# Patient Record
Sex: Male | Born: 1955 | Race: White | Hispanic: No | Marital: Married | Smoking: Never smoker
Health system: Southern US, Community
[De-identification: ages and names within clinical notes are randomized; demographics above are authoritative.]

## PROBLEM LIST (undated history)

## (undated) DIAGNOSIS — E291 Testicular hypofunction: Secondary | ICD-10-CM

## (undated) DIAGNOSIS — E785 Hyperlipidemia, unspecified: Secondary | ICD-10-CM

## (undated) DIAGNOSIS — I471 Supraventricular tachycardia, unspecified: Secondary | ICD-10-CM

## (undated) DIAGNOSIS — M67979 Unspecified disorder of synovium and tendon, unspecified ankle and foot: Secondary | ICD-10-CM

## (undated) DIAGNOSIS — I10 Essential (primary) hypertension: Secondary | ICD-10-CM

## (undated) DIAGNOSIS — K219 Gastro-esophageal reflux disease without esophagitis: Secondary | ICD-10-CM

## (undated) DIAGNOSIS — C801 Malignant (primary) neoplasm, unspecified: Secondary | ICD-10-CM

## (undated) DIAGNOSIS — R7303 Prediabetes: Secondary | ICD-10-CM

## (undated) DIAGNOSIS — I499 Cardiac arrhythmia, unspecified: Secondary | ICD-10-CM

## (undated) DIAGNOSIS — M1990 Unspecified osteoarthritis, unspecified site: Secondary | ICD-10-CM

## (undated) DIAGNOSIS — F419 Anxiety disorder, unspecified: Secondary | ICD-10-CM

## (undated) DIAGNOSIS — N400 Enlarged prostate without lower urinary tract symptoms: Secondary | ICD-10-CM

## (undated) DIAGNOSIS — N189 Chronic kidney disease, unspecified: Secondary | ICD-10-CM

## (undated) HISTORY — DX: Anxiety disorder, unspecified: F41.9

## (undated) HISTORY — DX: Supraventricular tachycardia: I47.1

## (undated) HISTORY — PX: CHOLECYSTECTOMY: SHX55

## (undated) HISTORY — DX: Unspecified osteoarthritis, unspecified site: M19.90

## (undated) HISTORY — DX: Unspecified disorder of synovium and tendon, unspecified ankle and foot: M67.979

## (undated) HISTORY — DX: Testicular hypofunction: E29.1

## (undated) HISTORY — DX: Chronic kidney disease, unspecified: N18.9

## (undated) HISTORY — DX: Prediabetes: R73.03

## (undated) HISTORY — PX: BASAL CELL CARCINOMA EXCISION: SHX1214

## (undated) HISTORY — DX: Essential (primary) hypertension: I10

## (undated) HISTORY — DX: Malignant (primary) neoplasm, unspecified: C80.1

## (undated) HISTORY — PX: JOINT REPLACEMENT: SHX530

## (undated) HISTORY — DX: Hyperlipidemia, unspecified: E78.5

## (undated) HISTORY — DX: Benign prostatic hyperplasia without lower urinary tract symptoms: N40.0

## (undated) HISTORY — PX: CARDIAC ELECTROPHYSIOLOGY MAPPING AND ABLATION: SHX1292

## (undated) HISTORY — DX: Supraventricular tachycardia, unspecified: I47.10

## (undated) HISTORY — PX: OTHER SURGICAL HISTORY: SHX169

---

## 2006-04-10 ENCOUNTER — Ambulatory Visit: Payer: Self-pay | Admitting: Gastroenterology

## 2006-04-10 LAB — HM COLONOSCOPY

## 2008-06-23 DIAGNOSIS — C4491 Basal cell carcinoma of skin, unspecified: Secondary | ICD-10-CM

## 2008-06-23 HISTORY — DX: Basal cell carcinoma of skin, unspecified: C44.91

## 2008-07-22 ENCOUNTER — Ambulatory Visit: Payer: Self-pay

## 2012-05-18 ENCOUNTER — Emergency Department: Payer: Self-pay | Admitting: Emergency Medicine

## 2013-07-25 ENCOUNTER — Ambulatory Visit: Payer: Self-pay | Admitting: Specialist

## 2013-07-27 ENCOUNTER — Emergency Department: Payer: Self-pay | Admitting: Emergency Medicine

## 2013-07-27 LAB — CBC
HCT: 36.6 % — ABNORMAL LOW (ref 40.0–52.0)
MCHC: 34.6 g/dL (ref 32.0–36.0)
MCV: 87 fL (ref 80–100)
Platelet: 210 10*3/uL (ref 150–440)
RBC: 4.2 10*6/uL — ABNORMAL LOW (ref 4.40–5.90)
RDW: 13.7 % (ref 11.5–14.5)
WBC: 8.7 10*3/uL (ref 3.8–10.6)

## 2013-07-27 LAB — COMPREHENSIVE METABOLIC PANEL
Albumin: 3.4 g/dL (ref 3.4–5.0)
Alkaline Phosphatase: 116 U/L (ref 50–136)
Anion Gap: 4 — ABNORMAL LOW (ref 7–16)
BUN: 30 mg/dL — ABNORMAL HIGH (ref 7–18)
Bilirubin,Total: 0.8 mg/dL (ref 0.2–1.0)
Calcium, Total: 9.1 mg/dL (ref 8.5–10.1)
Chloride: 110 mmol/L — ABNORMAL HIGH (ref 98–107)
Creatinine: 1.45 mg/dL — ABNORMAL HIGH (ref 0.60–1.30)
EGFR (Non-African Amer.): 53 — ABNORMAL LOW
Glucose: 94 mg/dL (ref 65–99)
Osmolality: 291 (ref 275–301)
SGPT (ALT): 55 U/L (ref 12–78)
Total Protein: 7 g/dL (ref 6.4–8.2)

## 2013-07-27 LAB — TROPONIN I: Troponin-I: <0.02 ng/mL

## 2013-09-02 ENCOUNTER — Emergency Department: Payer: Self-pay

## 2013-09-02 LAB — CBC
HCT: 43.5 % (ref 40.0–52.0)
HGB: 15 g/dL (ref 13.0–18.0)
Platelet: 195 10*3/uL (ref 150–440)
RBC: 4.99 10*6/uL (ref 4.40–5.90)
WBC: 10.5 10*3/uL (ref 3.8–10.6)

## 2013-09-02 LAB — COMPREHENSIVE METABOLIC PANEL
BUN: 23 mg/dL — ABNORMAL HIGH (ref 7–18)
Bilirubin,Total: 0.3 mg/dL (ref 0.2–1.0)
Calcium, Total: 9.4 mg/dL (ref 8.5–10.1)
Chloride: 107 mmol/L (ref 98–107)
EGFR (African American): 58 — ABNORMAL LOW
EGFR (Non-African Amer.): 50 — ABNORMAL LOW
Glucose: 122 mg/dL — ABNORMAL HIGH (ref 65–99)
Osmolality: 288 (ref 275–301)
SGOT(AST): 30 U/L (ref 15–37)
SGPT (ALT): 38 U/L (ref 12–78)
Sodium: 142 mmol/L (ref 136–145)

## 2013-10-02 ENCOUNTER — Emergency Department: Payer: Self-pay | Admitting: Emergency Medicine

## 2013-10-22 DIAGNOSIS — K219 Gastro-esophageal reflux disease without esophagitis: Secondary | ICD-10-CM | POA: Insufficient documentation

## 2013-10-27 DIAGNOSIS — C4492 Squamous cell carcinoma of skin, unspecified: Secondary | ICD-10-CM

## 2013-10-27 HISTORY — DX: Squamous cell carcinoma of skin, unspecified: C44.92

## 2014-10-06 DIAGNOSIS — Z9889 Other specified postprocedural states: Secondary | ICD-10-CM | POA: Insufficient documentation

## 2015-03-08 LAB — PSA

## 2015-09-22 DIAGNOSIS — E7800 Pure hypercholesterolemia, unspecified: Secondary | ICD-10-CM

## 2015-09-22 DIAGNOSIS — C4491 Basal cell carcinoma of skin, unspecified: Secondary | ICD-10-CM | POA: Insufficient documentation

## 2015-09-22 DIAGNOSIS — E669 Obesity, unspecified: Secondary | ICD-10-CM | POA: Insufficient documentation

## 2015-09-22 DIAGNOSIS — M1710 Unilateral primary osteoarthritis, unspecified knee: Secondary | ICD-10-CM | POA: Insufficient documentation

## 2015-09-22 DIAGNOSIS — N400 Enlarged prostate without lower urinary tract symptoms: Secondary | ICD-10-CM | POA: Insufficient documentation

## 2015-09-22 DIAGNOSIS — F419 Anxiety disorder, unspecified: Secondary | ICD-10-CM

## 2015-09-22 DIAGNOSIS — M179 Osteoarthritis of knee, unspecified: Secondary | ICD-10-CM | POA: Insufficient documentation

## 2015-09-22 DIAGNOSIS — I129 Hypertensive chronic kidney disease with stage 1 through stage 4 chronic kidney disease, or unspecified chronic kidney disease: Secondary | ICD-10-CM

## 2015-09-22 DIAGNOSIS — M1990 Unspecified osteoarthritis, unspecified site: Secondary | ICD-10-CM

## 2015-09-22 DIAGNOSIS — I471 Supraventricular tachycardia: Secondary | ICD-10-CM | POA: Insufficient documentation

## 2015-09-22 DIAGNOSIS — E291 Testicular hypofunction: Secondary | ICD-10-CM

## 2015-09-22 DIAGNOSIS — E1169 Type 2 diabetes mellitus with other specified complication: Secondary | ICD-10-CM | POA: Insufficient documentation

## 2015-09-22 DIAGNOSIS — I10 Essential (primary) hypertension: Secondary | ICD-10-CM | POA: Insufficient documentation

## 2015-09-23 ENCOUNTER — Ambulatory Visit (INDEPENDENT_AMBULATORY_CARE_PROVIDER_SITE_OTHER): Payer: BLUE CROSS/BLUE SHIELD | Admitting: Unknown Physician Specialty

## 2015-09-23 ENCOUNTER — Encounter: Payer: Self-pay | Admitting: Unknown Physician Specialty

## 2015-09-23 VITALS — BP 130/88 | HR 64 | Temp 99.1°F | Ht 71.7 in | Wt 251.8 lb

## 2015-09-23 DIAGNOSIS — E7800 Pure hypercholesterolemia, unspecified: Secondary | ICD-10-CM

## 2015-09-23 DIAGNOSIS — Z23 Encounter for immunization: Secondary | ICD-10-CM

## 2015-09-23 DIAGNOSIS — E669 Obesity, unspecified: Secondary | ICD-10-CM

## 2015-09-23 DIAGNOSIS — N400 Enlarged prostate without lower urinary tract symptoms: Secondary | ICD-10-CM | POA: Diagnosis not present

## 2015-09-23 DIAGNOSIS — I129 Hypertensive chronic kidney disease with stage 1 through stage 4 chronic kidney disease, or unspecified chronic kidney disease: Secondary | ICD-10-CM

## 2015-09-23 LAB — LIPID PANEL PICCOLO, WAIVED
CHOL/HDL RATIO PICCOLO,WAIVE: 4.6 mg/dL
CHOLESTEROL PICCOLO, WAIVED: 136 mg/dL (ref <200)
HDL CHOL PICCOLO, WAIVED: 30 mg/dL — AB (ref >59)
LDL CHOL CALC PICCOLO WAIVED: 74 mg/dL (ref <100)
TRIGLYCERIDES PICCOLO,WAIVED: 162 mg/dL — AB (ref <150)
VLDL Chol Calc Piccolo,Waive: 32 mg/dL — ABNORMAL HIGH (ref <30)

## 2015-09-23 LAB — MICROALBUMIN, URINE WAIVED
Creatinine, Urine Waived: 200 mg/dL (ref 10–300)
Microalb, Ur Waived: 30 mg/L — ABNORMAL HIGH (ref 0–19)
Microalb/Creat Ratio: <30 mg/g (ref <30)

## 2015-09-23 MED ORDER — SILDENAFIL CITRATE 100 MG PO TABS: 100.0000 mg | ORAL_TABLET | Freq: Every day | ORAL | Status: DC | PRN

## 2015-09-23 MED ORDER — ATORVASTATIN CALCIUM 20 MG PO TABS: 20.0000 mg | ORAL_TABLET | Freq: Every day | ORAL | Status: DC

## 2015-09-23 NOTE — Assessment & Plan Note (Addendum)
Reviewed lipid panel.  Labs improved, stable on atorvastatin. Encouraged exercise and diet.

## 2015-09-23 NOTE — Assessment & Plan Note (Signed)
Stable on current regimen   

## 2015-09-23 NOTE — Assessment & Plan Note (Signed)
Discussed diet and exercise 

## 2015-09-23 NOTE — Progress Notes (Signed)
BP 130/88 mmHg  Pulse 64  Temp(Src) 99.1 F (37.3 C)  Ht 5' 11.7" (1.821 m)  Wt 251 lb 12.8 oz (114.216 kg)  BMI 34.44 kg/m2  SpO2 97%   Subjective:    Patient ID: Brian Frederick, male    DOB: 07/11/1956, 59 y.o.   MRN: [id number]  HPI: Brian Frederick is a 59 y.o. male  Chief Complaint  Patient presents with  . Hyperlipidemia  . Hypogonadism  . Medication Refill    pt states he needs a refill on cialis and atorvastatin   Hypertension/Hyperlipidemia: Randomly monitors blood pressure at home with readings of 120's/80's, heart rate remains at 60's -70's. Compliant with medications, no missed doses. Does not exercise on a regular basis. Denies chest pain, headaches, shortness of breath or dizziness.    Relevant past  medical, surgical, family and social history reviewed and updated as indicated. Interim medical history since our last visit reviewed. Allergies and medications reviewed and updated.  Review of Systems  Constitutional: Negative.  Negative for fever, chills, activity change and appetite change.  HENT: Negative.  Negative for congestion, postnasal drip, rhinorrhea, sinus pressure and sore throat.   Eyes: Negative.  Negative for discharge and redness.  Respiratory: Negative.  Negative for cough, chest tightness, shortness of breath and wheezing.   Gastrointestinal: Negative.  Negative for diarrhea and constipation.  Musculoskeletal: Negative.  Negative for myalgias, back pain, joint swelling and arthralgias.  Skin: Negative.  Negative for color change, pallor, rash and wound.  Psychiatric/Behavioral: Negative.  Negative for confusion. The patient is not nervous/anxious and is not hyperactive.     Per HPI unless specifically indicated above     Objective:    BP 130/88 mmHg  Pulse 64  Temp(Src) 99.1 F (37.3 C)  Ht 5' 11.7" (1.821 m)  Wt 251 lb 12.8 oz (114.216 kg)  BMI 34.44 kg/m2  SpO2 97%  Wt Readings from Last 3 Encounters:  09/23/15 251 lb 12.8 oz  (114.216 kg)  03/08/15 257 lb (116.574 kg)    Physical Exam  Constitutional: He is oriented to person, place, and time. He appears well-developed and well-nourished. No distress.  HENT:  Head: Normocephalic.  Eyes: Conjunctivae are normal. Right eye exhibits no discharge. Left eye exhibits no discharge.  Neck: Normal range of motion.  Cardiovascular: Normal rate, regular rhythm and normal heart sounds.  Exam reveals no gallop and no friction rub.   No murmur heard. Pulmonary/Chest: Effort normal and breath sounds normal. No respiratory distress. He has no wheezes. He has no rales. He exhibits no tenderness.  Musculoskeletal: Normal range of motion. He exhibits no edema or tenderness.  Neurological: He is alert and oriented to person, place, and time.  Skin: Skin is warm and dry. No rash noted. He is not diaphoretic. No erythema. No pallor.  Psychiatric: He has a normal mood and affect. His behavior is normal. Judgment and thought content normal.        Assessment & Plan:   Problem List Items Addressed This Visit      Unprioritized   BPH (benign prostatic hypertrophy)   Hypertensive CKD (chronic kidney disease)    Stable on current regimen.      Relevant Orders   Uric acid   Microalbumin, Urine Waived (Completed)   Comprehensive metabolic panel   Obesity    Discussed diet and exercise      Relevant Orders   Lipid Panel Piccolo, Waived (Completed)   Hypercholesterolemia    Reviewed  lipid panel.  Labs improved, stable on atorvastatin. Encouraged exercise and diet.       Relevant Medications   atorvastatin (LIPITOR) 20 MG tablet   sildenafil (VIAGRA) 100 MG tablet   Other Relevant Orders   Lipid Panel Piccolo, Waived (Completed)    Other Visit Diagnoses    Immunization due    -  Primary    Relevant Orders    Flu Vaccine QUAD 36+ mos PF IM (Fluarix & Fluzone Quad PF) (Completed)        Follow up plan: Return in about 6 months (around 03/23/2016) for  physical.

## 2015-09-24 LAB — COMPREHENSIVE METABOLIC PANEL
ALK PHOS: 97 IU/L (ref 39–117)
ALT: 30 IU/L (ref 0–44)
AST: 26 IU/L (ref 0–40)
Albumin/Globulin Ratio: 2.3 (ref 1.1–2.5)
Albumin: 4.6 g/dL (ref 3.5–5.5)
BUN/Creatinine Ratio: 21 — ABNORMAL HIGH (ref 9–20)
BUN: 23 mg/dL (ref 6–24)
Bilirubin Total: 0.7 mg/dL (ref 0.0–1.2)
CO2: 27 mmol/L (ref 18–29)
CREATININE: 1.1 mg/dL (ref 0.76–1.27)
Calcium: 9.6 mg/dL (ref 8.7–10.2)
Chloride: 101 mmol/L (ref 97–108)
GFR calc Af Amer: 84 mL/min/{1.73_m2} (ref >59)
GFR calc non Af Amer: 73 mL/min/{1.73_m2} (ref >59)
GLUCOSE: 104 mg/dL — AB (ref 65–99)
Globulin, Total: 2 g/dL (ref 1.5–4.5)
Potassium: 5.1 mmol/L (ref 3.5–5.2)
Sodium: 139 mmol/L (ref 134–144)
Total Protein: 6.6 g/dL (ref 6.0–8.5)

## 2015-09-24 LAB — URIC ACID: Uric Acid: 4.3 mg/dL (ref 3.7–8.6)

## 2015-09-26 ENCOUNTER — Encounter: Payer: Self-pay | Admitting: Unknown Physician Specialty

## 2015-09-26 NOTE — Progress Notes (Signed)
Quick Note:  Normal labs. Patient notified by letter. ______ 

## 2015-09-30 ENCOUNTER — Other Ambulatory Visit: Payer: Self-pay | Admitting: Specialist

## 2015-09-30 DIAGNOSIS — M7552 Bursitis of left shoulder: Secondary | ICD-10-CM

## 2015-10-06 ENCOUNTER — Ambulatory Visit
Admission: RE | Admit: 2015-10-06 | Discharge: 2015-10-06 | Disposition: A | Discharge status: Home / Self Care | Payer: BLUE CROSS/BLUE SHIELD | Source: Ambulatory Visit | Attending: Specialist | Admitting: Specialist

## 2015-10-06 DIAGNOSIS — M7552 Bursitis of left shoulder: Secondary | ICD-10-CM | POA: Diagnosis present

## 2015-10-06 DIAGNOSIS — M19012 Primary osteoarthritis, left shoulder: Secondary | ICD-10-CM | POA: Insufficient documentation

## 2015-11-22 ENCOUNTER — Other Ambulatory Visit: Payer: Self-pay | Admitting: Unknown Physician Specialty

## 2015-12-19 ENCOUNTER — Other Ambulatory Visit: Payer: Self-pay | Admitting: Unknown Physician Specialty

## 2016-03-13 ENCOUNTER — Other Ambulatory Visit: Payer: Self-pay | Admitting: Unknown Physician Specialty

## 2016-03-23 ENCOUNTER — Encounter: Payer: Self-pay | Admitting: Unknown Physician Specialty

## 2016-03-23 ENCOUNTER — Ambulatory Visit (INDEPENDENT_AMBULATORY_CARE_PROVIDER_SITE_OTHER): Payer: BLUE CROSS/BLUE SHIELD | Admitting: Unknown Physician Specialty

## 2016-03-23 VITALS — BP 136/89 | HR 67 | Temp 99.4°F | Ht 70.25 in | Wt 249.0 lb

## 2016-03-23 DIAGNOSIS — Z0000 Encounter for general adult medical examination without abnormal findings: Secondary | ICD-10-CM

## 2016-03-23 DIAGNOSIS — I10 Essential (primary) hypertension: Secondary | ICD-10-CM

## 2016-03-23 DIAGNOSIS — Z1159 Encounter for screening for other viral diseases: Secondary | ICD-10-CM

## 2016-03-23 DIAGNOSIS — E7800 Pure hypercholesterolemia, unspecified: Secondary | ICD-10-CM

## 2016-03-23 DIAGNOSIS — I129 Hypertensive chronic kidney disease with stage 1 through stage 4 chronic kidney disease, or unspecified chronic kidney disease: Secondary | ICD-10-CM

## 2016-03-23 DIAGNOSIS — N400 Enlarged prostate without lower urinary tract symptoms: Secondary | ICD-10-CM

## 2016-03-23 DIAGNOSIS — Z23 Encounter for immunization: Secondary | ICD-10-CM

## 2016-03-23 DIAGNOSIS — M19012 Primary osteoarthritis, left shoulder: Secondary | ICD-10-CM | POA: Diagnosis not present

## 2016-03-23 DIAGNOSIS — E669 Obesity, unspecified: Secondary | ICD-10-CM

## 2016-03-23 MED ORDER — LANSOPRAZOLE 30 MG PO CPDR
30.0000 mg | DELAYED_RELEASE_CAPSULE | Freq: Every day | ORAL | Status: DC
Start: 2016-03-23 — End: 2017-02-17

## 2016-03-23 MED ORDER — ESCITALOPRAM OXALATE 10 MG PO TABS: 10.0000 mg | ORAL_TABLET | Freq: Every day | ORAL | Status: DC

## 2016-03-23 NOTE — Patient Instructions (Signed)
Think you're too busy to work out? We have the workout for you. In minutes, high-intensity interval training (H.I.I.T.) will have you sweating, breathing hard and maximizing the health benefits of exercise without the time commitment. Best of all, it's scientifically proven to work.  What Is H.I.I.T.? SHORT WORKOUTS 101 High-intensity interval training - referred to as H.I.I.T. - is based on the idea that short bursts of strenuous exercise can have a big impact on the body. If moderate exercise - like a 20-minute jog - is good for your heart, lungs and metabolism, H.I.I.T. packs the benefits of that workout and more into a few minutes. It may sound too good to be true, but learning this exercise technique and adapting it to your life can mean saving hours at the gym. If you think you don't have time to exercise, H.I.I.T. may be the workout for you.  You can try it with any aerobic activity you like. The principles of H.I.I.T. can be applied to running, biking, stair climbing, swimming, jumping rope, rowing, even hopping or skipping. (Yes, skipping!)  The downside? Even though H.I.I.T. lasts only minutes, the workouts are tough, requiring you to push your body near its limit.  HOW INTENSE IS HIGH INTENSITY? High-intensity exercise is obviously not a casual stroll down the street, but it's not a run-till-your-lungs-pop explosion, either. Think breathless, not winded. Heart-pounding, not exploding. Legs pumping, but not uncontrolled.  You don't need any fancy heart rate monitors to do these workouts. Use cues from your body as a guide. In the middle of a high-intensity workout you should be able to say single words, but not complete whole sentences. So, if you can keep chatting to your workout partner during this workout, pump it up a few notches.  10-05-29 Training This simple program will help you make the most of a short workout by improving heart health and endurance. Try it with your favorite  cardiovascular activity. The essentials of 10-05-29 training are simple. Run, ride or perhaps row on a rowing machine gently for 30 seconds, accelerate to a moderate pace for 20 seconds, then sprint as hard as you can for 10 seconds. (It should be called 30-20-10 training, obviously, but that is not as catchy.) Repeat.  You don't even need a stopwatch to monitor the 30-, 20-, and 10-second time changes. You can just count to yourself, which seems to make the intervals pass more quickly.  Best of all? The grueling, all-out portion of the workout lasts for only 10 seconds. C'mon, you can do anything for 10 seconds, right?  Got 10 Minutes? A solitary minute of hard work buried in 10 minutes of activity can make a big difference.  The 10-Minute Workout If you like to run, bike, row or swim - just a little bit - this workout is a great option for you. Step 1 Warm up for 2 minutes Step 2 Pedal, run or swim all-out for 20 seconds. Repeat 2 more times Warm down for 3 minutes    GET STARTED To benefit the most from really, really short workouts, you need to build the habit of doing them into your hectic life. Ideally, you'll complete the workout three times a week. The best way to build that habit is to start small and be willing to tweak your schedule where you can to accommodate your new workout.  First set up a spot in your house for your workout, equipped with whatever you need to get the job done: sneakers, a  chair, a towel, etc. Then slot your workout in before you would normally shower. (You can even do it in the bathroom.) Or wake up five minutes earlier and do it first thing in the morning, so you can head off to work feeling accomplished. Or do it during your lunch hour. Run up your office's stairs or grab a private conference room for just a few minutes. Or work it into your commute. If you walk or bike to work, add some heavy intervals on the way home.  GET A BOOST FROM MUSIC Creating a  workout playlist of high-energy tunes you love will not make your workout feel easier, but it may cause you to exercise harder without even realizing it. Best of all, if you are doing a really short workout, you need only one or two great tunes to get you through. If you are willing to try something a bit different, make your own music as you exercise. Sing, hum, clap your hands, whatever you can do to jam along to your playlist. It may give you an extra boost to finish strong.  Find a song or podcast that's the length of your really, really short workout. By the time the song is over, you're done.  Excerpted from the [employer] Well column http://www.nytimes.com/well/guides/really-really-short-workouts?smid=fb-nytwell&smtyp=pay  

## 2016-03-23 NOTE — Assessment & Plan Note (Signed)
Check Lipid panel 

## 2016-03-23 NOTE — Assessment & Plan Note (Signed)
HITT training

## 2016-03-23 NOTE — Progress Notes (Signed)
BP 136/89 mmHg  Pulse 67  Temp(Src) 99.4 F (37.4 C)  Ht 5' 10.25" (1.784 m)  Wt 249 lb (112.946 kg)  BMI 35.49 kg/m2  SpO2 96%   Subjective:    Patient ID: Brian Frederick, male    DOB: [birth year], 60 y.o.   MRN: [id number]  HPI: Brian Frederick is a 60 y.o. male  Chief Complaint  Patient presents with  . Annual Exam    He needs a refill on Prevacid and Lexapro  . Referral    He needs a new referral to a new Ortho for his shoulder  . Immunizations    He is interested in the shingles vaccine.   Depression screen [facility] 2/9 03/23/2016  Decreased Interest 0  Down, Depressed, Hopeless 0  PHQ - 2 Score 0    GERD Stable on Prevacid.  Shoulder pain Pt with left shoulder pain and would like a referral back to [facility] as he has OA and the "only thing for it" is a shoulder replacement.    Hypertension Using medications without difficulty Average home BPs: not checking  No problems or lightheadedness No chest pain with exertion or shortness of breath No Edema   Hyperlipidemia Using medications without problems No Muscle aches  Exercise:"Not enough"  Relevant past medical, surgical, family and social history reviewed and updated as indicated. Interim medical history since our last visit reviewed. Allergies and medications reviewed and updated.  Review of Systems  Constitutional: Negative.   HENT: Negative.   Eyes: Negative.   Respiratory: Negative.   Cardiovascular: Negative.   Gastrointestinal: Negative.   Endocrine: Negative.   Genitourinary:       Nocturia.  Had seen Dr. Rogers Blocker  Musculoskeletal:       Goes to [facility] for shoulders and knees and "gets injections every so often."  Skin: Negative.   Allergic/Immunologic: Negative.   Neurological: Negative.   Hematological: Negative.   Psychiatric/Behavioral: Negative.     Per HPI unless specifically indicated above     Objective:    BP 136/89 mmHg  Pulse 67  Temp(Src) 99.4 F  (37.4 C)  Ht 5' 10.25" (1.784 m)  Wt 249 lb (112.946 kg)  BMI 35.49 kg/m2  SpO2 96%  Wt Readings from Last 3 Encounters:  03/23/16 249 lb (112.946 kg)  09/23/15 251 lb 12.8 oz (114.216 kg)  03/08/15 257 lb (116.574 kg)    Physical Exam  Constitutional: He is oriented to person, place, and time. He appears well-developed and well-nourished.  HENT:  Head: Normocephalic.  Right Ear: Tympanic membrane, external ear and ear canal normal.  Left Ear: Tympanic membrane, external ear and ear canal normal.  Mouth/Throat: Uvula is midline, oropharynx is clear and moist and mucous membranes are normal.  Eyes: Pupils are equal, round, and reactive to light.  Cardiovascular: Normal rate, regular rhythm and normal heart sounds.  Exam reveals no gallop and no friction rub.   No murmur heard. Pulmonary/Chest: Effort normal and breath sounds normal. No respiratory distress.  Abdominal: Soft. Bowel sounds are normal. He exhibits no distension. There is no tenderness.  Musculoskeletal: Normal range of motion.  Neurological: He is alert and oriented to person, place, and time. He has normal reflexes.  Skin: Skin is warm and dry.  Psychiatric: He has a normal mood and affect. His behavior is normal. Judgment and thought content normal.    Results for orders placed or performed in visit on 09/23/15  Lipid Panel Piccolo, [employer]  Result  Value Ref Range   Cholesterol Piccolo, Waived 136 <200 mg/dL   HDL Chol Piccolo, Waived 30 (L) >59 mg/dL   Triglycerides Piccolo,Waived 162 (H) <150 mg/dL   Chol/HDL Ratio Piccolo,Waive 4.6 mg/dL   LDL Chol Calc Piccolo Waived 74 <100 mg/dL   VLDL Chol Calc Piccolo,Waive 32 (H) <30 mg/dL  Uric acid  Result Value Ref Range   Uric Acid 4.3 3.7 - 8.6 mg/dL  Microalbumin, Urine Waived  Result Value Ref Range   Microalb, Ur Waived 30 (H) 0 - 19 mg/L   Creatinine, Urine Waived 200 10 - 300 mg/dL   Microalb/Creat Ratio <30 <30 mg/g  Comprehensive metabolic panel   Result Value Ref Range   Glucose 104 (H) 65 - 99 mg/dL   BUN 23 6 - 24 mg/dL   Creatinine, Ser 1.10 0.76 - 1.27 mg/dL   GFR calc non Af Amer 73 >59 mL/min/1.73   GFR calc Af Amer 84 >59 mL/min/1.73   BUN/Creatinine Ratio 21 (H) 9 - 20   Sodium 139 134 - 144 mmol/L   Potassium 5.1 3.5 - 5.2 mmol/L   Chloride 101 97 - 108 mmol/L   CO2 27 18 - 29 mmol/L   Calcium 9.6 8.7 - 10.2 mg/dL   Total Protein 6.6 6.0 - 8.5 g/dL   Albumin 4.6 3.5 - 5.5 g/dL   Globulin, Total 2.0 1.5 - 4.5 g/dL   Albumin/Globulin Ratio 2.3 1.1 - 2.5   Bilirubin Total 0.7 0.0 - 1.2 mg/dL   Alkaline Phosphatase 97 39 - 117 IU/L   AST 26 0 - 40 IU/L   ALT 30 0 - 44 IU/L      Assessment & Plan:   Problem List Items Addressed This Visit      Unprioritized   BPH (benign prostatic hypertrophy)    Stable.  Check PSA after shared decision making      Relevant Orders   PSA   Hypercholesterolemia    Check Li[pid panel      Relevant Orders   Comprehensive metabolic panel   Lipid Panel w/o Chol/HDL Ratio   Hypertension    Check CMP      Need for hepatitis C screening test - Primary   Relevant Orders   Hepatitis C Antibody   Obesity    HITT training      Osteoarthritis   Relevant Medications   traMADol (ULTRAM) 50 MG tablet   Other Relevant Orders   Ambulatory referral to Orthopedic Surgery    Other Visit Diagnoses    Routine general medical examination at a health care facility        Relevant Orders    PSA    TSH    Hepatitis C antibody    HIV antibody    Varicella-zoster vaccine subcutaneous       Pt ed on HITT training  Follow up plan: Return in about 6 months (around 09/22/2016).

## 2016-03-23 NOTE — Assessment & Plan Note (Signed)
Check CMP.  ?

## 2016-03-23 NOTE — Assessment & Plan Note (Signed)
Stable.  Check PSA after shared decision making

## 2016-03-24 LAB — PSA: Prostate Specific Ag, Serum: 0.2 ng/mL (ref 0.0–4.0)

## 2016-03-24 LAB — COMPREHENSIVE METABOLIC PANEL
ALBUMIN: 4.6 g/dL (ref 3.6–4.8)
ALK PHOS: 99 IU/L (ref 39–117)
ALT: 32 IU/L (ref 0–44)
AST: 24 IU/L (ref 0–40)
Albumin/Globulin Ratio: 1.9 (ref 1.2–2.2)
BILIRUBIN TOTAL: 0.7 mg/dL (ref 0.0–1.2)
BUN / CREAT RATIO: 23 (ref 10–24)
BUN: 27 mg/dL (ref 8–27)
CHLORIDE: 102 mmol/L (ref 96–106)
CO2: 21 mmol/L (ref 18–29)
Calcium: 9.3 mg/dL (ref 8.6–10.2)
Creatinine, Ser: 1.17 mg/dL (ref 0.76–1.27)
GFR calc Af Amer: 78 mL/min/{1.73_m2} (ref >59)
GFR calc non Af Amer: 67 mL/min/{1.73_m2} (ref >59)
GLOBULIN, TOTAL: 2.4 g/dL (ref 1.5–4.5)
Glucose: 109 mg/dL — ABNORMAL HIGH (ref 65–99)
POTASSIUM: 4.7 mmol/L (ref 3.5–5.2)
SODIUM: 141 mmol/L (ref 134–144)
Total Protein: 7 g/dL (ref 6.0–8.5)

## 2016-03-24 LAB — LIPID PANEL W/O CHOL/HDL RATIO
CHOLESTEROL TOTAL: 142 mg/dL (ref 100–199)
HDL: 32 mg/dL — ABNORMAL LOW (ref >39)
LDL Calculated: 80 mg/dL (ref 0–99)
TRIGLYCERIDES: 152 mg/dL — AB (ref 0–149)
VLDL Cholesterol Cal: 30 mg/dL (ref 5–40)

## 2016-03-24 LAB — TSH: TSH: 2.38 u[IU]/mL (ref 0.450–4.500)

## 2016-03-24 LAB — HEPATITIS C ANTIBODY: Hep C Virus Ab: <0.1 s/co ratio (ref 0.0–0.9)

## 2016-03-24 LAB — HIV ANTIBODY (ROUTINE TESTING W REFLEX): HIV SCREEN 4TH GENERATION: NONREACTIVE

## 2016-03-26 ENCOUNTER — Encounter: Payer: Self-pay | Admitting: Unknown Physician Specialty

## 2016-04-25 ENCOUNTER — Ambulatory Visit (INDEPENDENT_AMBULATORY_CARE_PROVIDER_SITE_OTHER): Payer: BLUE CROSS/BLUE SHIELD | Admitting: Unknown Physician Specialty

## 2016-04-25 ENCOUNTER — Encounter: Payer: Self-pay | Admitting: Unknown Physician Specialty

## 2016-04-25 VITALS — BP 137/82 | HR 63 | Temp 99.1°F | Ht 69.8 in | Wt 244.0 lb

## 2016-04-25 DIAGNOSIS — Z01818 Encounter for other preprocedural examination: Secondary | ICD-10-CM | POA: Diagnosis not present

## 2016-04-25 NOTE — Progress Notes (Signed)
++-  BP 137/82 mmHg  Pulse 63  Temp(Src) 99.1 F (37.3 C)  Ht 5' 9.8" (1.773 m)  Wt 244 lb (110.678 kg)  BMI 35.21 kg/m2  SpO2 97%   Subjective:    Patient ID: Brian Frederick, male    DOB: May 31, 1956, 60 y.o.   MRN: [id number]  HPI: Brian Frederick is a 60 y.o. male  Chief Complaint  Patient presents with  . Surgery Clearance   Past Surgical History  Procedure Laterality Date  . Cholecystectomy    . Basal cell carcinoma excision    . Cardio oblation     He has never had a problem with anesthesia in the past.  Significant history for PSVT resolved with ablation.    Pre-op for shoulder replacement  Due for a shoulder replacement next month. Physical done last month which was WNL.  Labs reviewed.  All WNL but needs CBC.    Relevant past medical, surgical, family and social history reviewed and updated as indicated. Interim medical history since our last visit reviewed. Allergies and medications reviewed and updated.  Review of Systems  Constitutional: Negative.   HENT: Negative.   Eyes: Negative.   Respiratory: Negative.   Cardiovascular: Negative.   Gastrointestinal: Negative.   Endocrine: Negative.   Genitourinary: Negative.   Skin: Negative.   Allergic/Immunologic: Negative.   Neurological: Negative.   Hematological: Negative.   Psychiatric/Behavioral: Negative.     Per HPI unless specifically indicated above     Objective:    BP 137/82 mmHg  Pulse 63  Temp(Src) 99.1 F (37.3 C)  Ht 5' 9.8" (1.773 m)  Wt 244 lb (110.678 kg)  BMI 35.21 kg/m2  SpO2 97%  Wt Readings from Last 3 Encounters:  04/25/16 244 lb (110.678 kg)  03/23/16 249 lb (112.946 kg)  09/23/15 251 lb 12.8 oz (114.216 kg)    Physical Exam  Constitutional: He is oriented to person, place, and time. He appears well-developed and well-nourished. No distress.  HENT:  Head: Normocephalic and atraumatic.  Eyes: Conjunctivae and lids are normal. Right eye exhibits no discharge. Left eye  exhibits no discharge. No scleral icterus.  Cardiovascular: Normal rate.   Pulmonary/Chest: Effort normal.  Abdominal: Normal appearance. There is no splenomegaly or hepatomegaly.  Musculoskeletal: Normal range of motion.  Neurological: He is alert and oriented to person, place, and time.  Skin: Skin is intact. No rash noted. No pallor.  Psychiatric: He has a normal mood and affect. His behavior is normal. Judgment and thought content normal.   EKG is normal  Results for orders placed or performed in visit on 03/23/16  Hepatitis C Antibody  Result Value Ref Range   Hep C Virus Ab <0.1 0.0 - 0.9 s/co ratio  Comprehensive metabolic panel  Result Value Ref Range   Glucose 109 (H) 65 - 99 mg/dL   BUN 27 8 - 27 mg/dL   Creatinine, Ser 1.17 0.76 - 1.27 mg/dL   GFR calc non Af Amer 67 >59 mL/min/1.73   GFR calc Af Amer 78 >59 mL/min/1.73   BUN/Creatinine Ratio 23 10 - 24   Sodium 141 134 - 144 mmol/L   Potassium 4.7 3.5 - 5.2 mmol/L   Chloride 102 96 - 106 mmol/L   CO2 21 18 - 29 mmol/L   Calcium 9.3 8.6 - 10.2 mg/dL   Total Protein 7.0 6.0 - 8.5 g/dL   Albumin 4.6 3.6 - 4.8 g/dL   Globulin, Total 2.4 1.5 - 4.5 g/dL  Albumin/Globulin Ratio 1.9 1.2 - 2.2   Bilirubin Total 0.7 0.0 - 1.2 mg/dL   Alkaline Phosphatase 99 39 - 117 IU/L   AST 24 0 - 40 IU/L   ALT 32 0 - 44 IU/L  Lipid Panel w/o Chol/HDL Ratio  Result Value Ref Range   Cholesterol, Total 142 100 - 199 mg/dL   Triglycerides 152 (H) 0 - 149 mg/dL   HDL 32 (L) >39 mg/dL   VLDL Cholesterol Cal 30 5 - 40 mg/dL   LDL Calculated 80 0 - 99 mg/dL  PSA  Result Value Ref Range   Prostate Specific Ag, Serum 0.2 0.0 - 4.0 ng/mL  TSH  Result Value Ref Range   TSH 2.380 0.450 - 4.500 uIU/mL  HIV antibody  Result Value Ref Range   HIV Screen 4th Generation wRfx Non Reactive Non Reactive      Assessment & Plan:   Problem List Items Addressed This Visit    None    Visit Diagnoses    Pre-op evaluation    -  Primary     Relevant Orders    CBC with Differential/Platelet       Stop ASA and Mobic 1 week prior to surgery  Follow up plan: Return prn.

## 2016-04-26 LAB — CBC WITH DIFFERENTIAL/PLATELET
BASOS ABS: 0 10*3/uL (ref 0.0–0.2)
BASOS: 0 %
EOS (ABSOLUTE): 0.2 10*3/uL (ref 0.0–0.4)
Eos: 1 %
Hematocrit: 45.9 % (ref 37.5–51.0)
Hemoglobin: 15.3 g/dL (ref 12.6–17.7)
Immature Grans (Abs): 0.2 10*3/uL — ABNORMAL HIGH (ref 0.0–0.1)
Immature Granulocytes: 2 %
LYMPHS ABS: 3.4 10*3/uL — AB (ref 0.7–3.1)
Lymphs: 27 %
MCH: 29.9 pg (ref 26.6–33.0)
MCHC: 33.3 g/dL (ref 31.5–35.7)
MCV: 90 fL (ref 79–97)
MONOS ABS: 1.1 10*3/uL — AB (ref 0.1–0.9)
Monocytes: 8 %
NEUTROS ABS: 8 10*3/uL — AB (ref 1.4–7.0)
Neutrophils: 62 %
PLATELETS: 229 10*3/uL (ref 150–379)
RBC: 5.12 x10E6/uL (ref 4.14–5.80)
RDW: 13.9 % (ref 12.3–15.4)
WBC: 12.9 10*3/uL — ABNORMAL HIGH (ref 3.4–10.8)

## 2016-04-27 ENCOUNTER — Other Ambulatory Visit: Payer: Self-pay | Admitting: Unknown Physician Specialty

## 2016-04-27 ENCOUNTER — Telehealth: Payer: Self-pay | Admitting: Unknown Physician Specialty

## 2016-04-27 ENCOUNTER — Ambulatory Visit (INDEPENDENT_AMBULATORY_CARE_PROVIDER_SITE_OTHER): Payer: BLUE CROSS/BLUE SHIELD | Admitting: Unknown Physician Specialty

## 2016-04-27 ENCOUNTER — Encounter: Payer: Self-pay | Admitting: Unknown Physician Specialty

## 2016-04-27 VITALS — BP 119/80 | HR 68 | Temp 98.5°F | Ht 69.8 in | Wt 245.6 lb

## 2016-04-27 DIAGNOSIS — D72829 Elevated white blood cell count, unspecified: Secondary | ICD-10-CM

## 2016-04-27 DIAGNOSIS — J019 Acute sinusitis, unspecified: Secondary | ICD-10-CM

## 2016-04-27 MED ORDER — AMOXICILLIN 875 MG PO TABS: 875.0000 mg | ORAL_TABLET | Freq: Two times a day (BID) | ORAL | Status: DC

## 2016-04-27 MED ORDER — DOXYCYCLINE HYCLATE 100 MG PO TABS: 100.0000 mg | ORAL_TABLET | Freq: Two times a day (BID) | ORAL | Status: DC

## 2016-04-27 NOTE — Telephone Encounter (Signed)
Discussed CBC with wife who has permission to receive medical information.  Asked to recheck CBC.  If pt does not come in today, his cell number is [phone]

## 2016-04-27 NOTE — Telephone Encounter (Signed)
Patient came in for appointment. Will come back for labs.

## 2016-04-27 NOTE — Progress Notes (Signed)
BP 119/80 mmHg  Pulse 68  Temp(Src) 98.5 F (36.9 C)  Ht 5' 9.8" (1.773 m)  Wt 245 lb 9.6 oz (111.403 kg)  BMI 35.44 kg/m2  SpO2 95%   Subjective:    Patient ID: Brian Frederick, male    DOB: 04/07/1956, 60 y.o.   MRN: [id number]  HPI: Brian Frederick is a 60 y.o. male  Chief Complaint  Patient presents with  . URI    pt states he has a little bit of a sore throat, post nasal drip, congestion, and sinus pressure.    Pt here following an elevated WBC.  Admits to sinus symptoms for about 1 month.    URI  This is a new problem. The current episode started more than 1 month ago. The problem has been waxing and waning. There has been no fever. Associated symptoms include congestion, neck pain, a sore throat and swollen glands. He has tried nothing for the symptoms.     Relevant past medical, surgical, family and social history reviewed and updated as indicated. Interim medical history since our last visit reviewed. Allergies and medications reviewed and updated.  Review of Systems  HENT: Positive for congestion and sore throat.   Musculoskeletal: Positive for neck pain.    Per HPI unless specifically indicated above     Objective:    BP 119/80 mmHg  Pulse 68  Temp(Src) 98.5 F (36.9 C)  Ht 5' 9.8" (1.773 m)  Wt 245 lb 9.6 oz (111.403 kg)  BMI 35.44 kg/m2  SpO2 95%  Wt Readings from Last 3 Encounters:  04/27/16 245 lb 9.6 oz (111.403 kg)  04/25/16 244 lb (110.678 kg)  03/23/16 249 lb (112.946 kg)    Physical Exam  Constitutional: He is oriented to person, place, and time. He appears well-developed and well-nourished. No distress.  HENT:  Head: Normocephalic and atraumatic.  Right Ear: Tympanic membrane and ear canal normal.  Left Ear: Tympanic membrane and ear canal normal.  Nose: Rhinorrhea present. Right sinus exhibits no maxillary sinus tenderness and no frontal sinus tenderness. Left sinus exhibits no maxillary sinus tenderness and no frontal sinus  tenderness.  Mouth/Throat: Uvula is midline. Posterior oropharyngeal edema present.  Eyes: Conjunctivae and lids are normal. Right eye exhibits no discharge. Left eye exhibits no discharge. No scleral icterus.  Neck: Neck supple.  Cardiovascular: Normal rate, regular rhythm and normal heart sounds.   Pulmonary/Chest: Effort normal and breath sounds normal. No respiratory distress.  Abdominal: Normal appearance. There is no splenomegaly or hepatomegaly.  Musculoskeletal: Normal range of motion.  Neurological: He is alert and oriented to person, place, and time.  Skin: Skin is warm, dry and intact. No rash noted. No pallor.  Psychiatric: He has a normal mood and affect. His behavior is normal. Judgment and thought content normal.  Nursing note and vitals reviewed.   Results for orders placed or performed in visit on 04/25/16  CBC with Differential/Platelet  Result Value Ref Range   WBC 12.9 (H) 3.4 - 10.8 x10E3/uL   RBC 5.12 4.14 - 5.80 x10E6/uL   Hemoglobin 15.3 12.6 - 17.7 g/dL   Hematocrit 45.9 37.5 - 51.0 %   MCV 90 79 - 97 fL   MCH 29.9 26.6 - 33.0 pg   MCHC 33.3 31.5 - 35.7 g/dL   RDW 13.9 12.3 - 15.4 %   Platelets 229 150 - 379 x10E3/uL   Neutrophils 62 %   Lymphs 27 %   Monocytes 8 %  Eos 1 %   Basos 0 %   Neutrophils Absolute 8.0 (H) 1.4 - 7.0 x10E3/uL   Lymphocytes Absolute 3.4 (H) 0.7 - 3.1 x10E3/uL   Monocytes Absolute 1.1 (H) 0.1 - 0.9 x10E3/uL   EOS (ABSOLUTE) 0.2 0.0 - 0.4 x10E3/uL   Basophils Absolute 0.0 0.0 - 0.2 x10E3/uL   Immature Granulocytes 2 %   Immature Grans (Abs) 0.2 (H) 0.0 - 0.1 x10E3/uL      Assessment & Plan:   Problem List Items Addressed This Visit    None    Visit Diagnoses    Acute sinusitis, recurrence not specified, unspecified location    -  Primary    due to high WBC will rx Amoxil for treatment.  Recheck next week for CBC    Relevant Medications    amoxicillin (AMOXIL) 875 MG tablet    Elevated WBC count        Relevant Orders     CBC With Differential/Platelet        Follow up plan: Return for check CBC next week.

## 2016-05-03 ENCOUNTER — Other Ambulatory Visit: Payer: BLUE CROSS/BLUE SHIELD

## 2016-05-03 DIAGNOSIS — D72829 Elevated white blood cell count, unspecified: Secondary | ICD-10-CM

## 2016-05-04 LAB — CBC WITH DIFFERENTIAL/PLATELET
BASOS ABS: 0 10*3/uL (ref 0.0–0.2)
Basos: 0 %
EOS (ABSOLUTE): 0.2 10*3/uL (ref 0.0–0.4)
Eos: 2 %
Hematocrit: 43.5 % (ref 37.5–51.0)
Hemoglobin: 14.4 g/dL (ref 12.6–17.7)
IMMATURE GRANULOCYTES: 0 %
Immature Grans (Abs): 0 10*3/uL (ref 0.0–0.1)
LYMPHS ABS: 2.8 10*3/uL (ref 0.7–3.1)
Lymphs: 30 %
MCH: 29.7 pg (ref 26.6–33.0)
MCHC: 33.1 g/dL (ref 31.5–35.7)
MCV: 90 fL (ref 79–97)
MONOS ABS: 0.5 10*3/uL (ref 0.1–0.9)
Monocytes: 6 %
NEUTROS PCT: 62 %
Neutrophils Absolute: 5.8 10*3/uL (ref 1.4–7.0)
PLATELETS: 190 10*3/uL (ref 150–379)
RBC: 4.85 x10E6/uL (ref 4.14–5.80)
RDW: 13.4 % (ref 12.3–15.4)
WBC: 9.3 10*3/uL (ref 3.4–10.8)

## 2016-08-23 ENCOUNTER — Encounter: Payer: Self-pay | Admitting: Family Medicine

## 2016-08-23 ENCOUNTER — Ambulatory Visit (INDEPENDENT_AMBULATORY_CARE_PROVIDER_SITE_OTHER): Payer: BLUE CROSS/BLUE SHIELD | Admitting: Family Medicine

## 2016-08-23 VITALS — BP 114/78 | HR 77 | Temp 98.3°F | Wt 253.0 lb

## 2016-08-23 DIAGNOSIS — L03116 Cellulitis of left lower limb: Secondary | ICD-10-CM

## 2016-08-23 MED ORDER — MUPIROCIN 2 % EX OINT: 1.0000 "application " | TOPICAL_OINTMENT | Freq: Two times a day (BID) | CUTANEOUS | 0 refills | Status: DC

## 2016-08-23 NOTE — Progress Notes (Signed)
BP 114/78 (BP Location: Left Arm, Patient Position: Sitting, Cuff Size: Normal)   Pulse 77   Temp 98.3 F (36.8 C)   Wt 253 lb (114.8 kg) Comment: with shoes  SpO2 98%   BMI 36.51 kg/m    Subjective:    Patient ID: Brian Frederick, male    DOB: 03/25/1956, 60 y.o.   MRN: [id number]  HPI: Brian Frederick is a 60 y.o. male  Chief Complaint  Patient presents with  . Abrasion    left ankle  Happened 5 days ago did well without redness seem to be healing until yesterday when got red and inflamed. Patient especially concerned because his had shoulder replacement surgery and has a prescription for clindamycin to take for dental appointments.   Relevant past medical, surgical, family and social history reviewed and updated as indicated. Interim medical history since our last visit reviewed. Allergies and medications reviewed and updated.  Review of Systems  Constitutional: Negative.   Respiratory: Negative.   Cardiovascular: Negative.     Per HPI unless specifically indicated above     Objective:    BP 114/78 (BP Location: Left Arm, Patient Position: Sitting, Cuff Size: Normal)   Pulse 77   Temp 98.3 F (36.8 C)   Wt 253 lb (114.8 kg) Comment: with shoes  SpO2 98%   BMI 36.51 kg/m   Wt Readings from Last 3 Encounters:  08/23/16 253 lb (114.8 kg)  04/27/16 245 lb 9.6 oz (111.4 kg)  04/25/16 244 lb (110.7 kg)    Physical Exam  Constitutional: He is oriented to person, place, and time. He appears well-developed and well-nourished. No distress.  HENT:  Head: Normocephalic and atraumatic.  Right Ear: Hearing normal.  Left Ear: Hearing normal.  Nose: Nose normal.  Eyes: Conjunctivae and lids are normal. Right eye exhibits no discharge. Left eye exhibits no discharge. No scleral icterus.  Pulmonary/Chest: Effort normal. No respiratory distress.  Musculoskeletal: Normal range of motion.  Neurological: He is alert and oriented to person, place, and time.  Skin: Skin is  intact. No rash noted.  Inflamed patch with cellulitis left lateral ankle tetanus up-to-date  Psychiatric: He has a normal mood and affect. His speech is normal and behavior is normal. Judgment and thought content normal. Cognition and memory are normal.    Results for orders placed or performed in visit on 05/03/16  CBC with Differential/Platelet  Result Value Ref Range   WBC 9.3 3.4 - 10.8 x10E3/uL   RBC 4.85 4.14 - 5.80 x10E6/uL   Hemoglobin 14.4 12.6 - 17.7 g/dL   Hematocrit 43.5 37.5 - 51.0 %   MCV 90 79 - 97 fL   MCH 29.7 26.6 - 33.0 pg   MCHC 33.1 31.5 - 35.7 g/dL   RDW 13.4 12.3 - 15.4 %   Platelets 190 150 - 379 x10E3/uL   Neutrophils 62 %   Lymphs 30 %   Monocytes 6 %   Eos 2 %   Basos 0 %   Neutrophils Absolute 5.8 1.4 - 7.0 x10E3/uL   Lymphocytes Absolute 2.8 0.7 - 3.1 x10E3/uL   Monocytes Absolute 0.5 0.1 - 0.9 x10E3/uL   EOS (ABSOLUTE) 0.2 0.0 - 0.4 x10E3/uL   Basophils Absolute 0.0 0.0 - 0.2 x10E3/uL   Immature Granulocytes 0 %   Immature Grans (Abs) 0.0 0.0 - 0.1 x10E3/uL      Assessment & Plan:   Problem List Items Addressed This Visit    None    Visit  Diagnoses    Cellulitis of left lower extremity    -  Primary   Discuss cellulitis care and treatment dressing bandage use of Bactroban and if worsening problems recheck for oral antibiotics.      Concerned because of history of shoulder replacement surgery if not better with topical treatment will need oral medications.  Follow up plan: Return for As scheduled.

## 2016-09-25 ENCOUNTER — Ambulatory Visit (INDEPENDENT_AMBULATORY_CARE_PROVIDER_SITE_OTHER): Payer: BLUE CROSS/BLUE SHIELD | Admitting: Unknown Physician Specialty

## 2016-09-25 ENCOUNTER — Encounter: Payer: Self-pay | Admitting: Unknown Physician Specialty

## 2016-09-25 VITALS — BP 110/75 | HR 69 | Temp 98.8°F | Ht 71.8 in | Wt 251.2 lb

## 2016-09-25 DIAGNOSIS — Z23 Encounter for immunization: Secondary | ICD-10-CM | POA: Diagnosis not present

## 2016-09-25 DIAGNOSIS — Z6834 Body mass index (BMI) 34.0-34.9, adult: Secondary | ICD-10-CM | POA: Diagnosis not present

## 2016-09-25 DIAGNOSIS — K219 Gastro-esophageal reflux disease without esophagitis: Secondary | ICD-10-CM | POA: Diagnosis not present

## 2016-09-25 DIAGNOSIS — I10 Essential (primary) hypertension: Secondary | ICD-10-CM

## 2016-09-25 DIAGNOSIS — E7800 Pure hypercholesterolemia, unspecified: Secondary | ICD-10-CM | POA: Diagnosis not present

## 2016-09-25 DIAGNOSIS — E6609 Other obesity due to excess calories: Secondary | ICD-10-CM | POA: Diagnosis not present

## 2016-09-25 LAB — MICROALBUMIN, URINE WAIVED
CREATININE, URINE WAIVED: 200 mg/dL (ref 10–300)
MICROALB, UR WAIVED: 30 mg/L — AB (ref 0–19)
Microalb/Creat Ratio: <30 mg/g (ref <30)

## 2016-09-25 MED ORDER — ATORVASTATIN CALCIUM 20 MG PO TABS: 20.0000 mg | ORAL_TABLET | Freq: Every day | ORAL | 3 refills | Status: DC

## 2016-09-25 MED ORDER — ESCITALOPRAM OXALATE 10 MG PO TABS: 10.0000 mg | ORAL_TABLET | Freq: Every day | ORAL | 1 refills | Status: DC

## 2016-09-25 NOTE — Assessment & Plan Note (Signed)
Stable, continue current medication.

## 2016-09-25 NOTE — Assessment & Plan Note (Signed)
Lipid panel checked. Patient was non-fasting.

## 2016-09-25 NOTE — Progress Notes (Signed)
BP 110/75 (BP Location: Left Arm, Patient Position: Sitting, Cuff Size: Large)   Pulse 69   Temp 98.8 F (37.1 C)   Ht 5' 11.8" (1.824 m)   Wt 251 lb 3.2 oz (113.9 kg)   SpO2 96%   BMI 34.26 kg/m    Subjective:    Patient ID: Brian Frederick, male    DOB: 06/11/1956, 60 y.o.   MRN: [id number]  HPI: Brian Frederick is a 60 y.o. male  Chief Complaint  Patient presents with  . Medication Refill    pt states he is here to follow up on medications he needs refills on lexapro, prevacid, lipitor,metoprolol    Hypertension  Using medications without difficulty  Average home BP's 130's / 80's    Using medication without problems or lightheadedness  No chest pain with exertion or shortness of breath No Edema  Elevated Cholesterol Using medications without problems  No Muscle aches  Diet: "its bad, I like fried foods"  Exercise: not enough. Occasionally walks, knows he should be doing this more often.   GERD Stable on prevacid   Depression PHQ-2 assessed; score 1 Continue current medications.    Relevant past medical, surgical, family and social history reviewed and updated as indicated. Interim medical history since our last visit reviewed. Allergies and medications reviewed and updated.  Review of Systems  Respiratory: Negative for cough, chest tightness and shortness of breath.   Cardiovascular: Negative for chest pain and leg swelling.  Neurological: Negative for dizziness, light-headedness and headaches.    Per HPI unless specifically indicated above     Objective:    BP 110/75 (BP Location: Left Arm, Patient Position: Sitting, Cuff Size: Large)   Pulse 69   Temp 98.8 F (37.1 C)   Ht 5' 11.8" (1.824 m)   Wt 251 lb 3.2 oz (113.9 kg)   SpO2 96%   BMI 34.26 kg/m   Wt Readings from Last 3 Encounters:  09/25/16 251 lb 3.2 oz (113.9 kg)  08/23/16 253 lb (114.8 kg)  04/27/16 245 lb 9.6 oz (111.4 kg)    Physical Exam  Constitutional: He is oriented to  person, place, and time. He appears well-developed and well-nourished. No distress.  Cardiovascular: Normal rate, regular rhythm and normal heart sounds.   No carotid bruits noted.   Pulmonary/Chest: Effort normal and breath sounds normal. No respiratory distress.  Neurological: He is alert and oriented to person, place, and time.  Psychiatric: He has a normal mood and affect. His behavior is normal. Judgment and thought content normal.    Results for orders placed or performed in visit on 05/03/16  CBC with Differential/Platelet  Result Value Ref Range   WBC 9.3 3.4 - 10.8 x10E3/uL   RBC 4.85 4.14 - 5.80 x10E6/uL   Hemoglobin 14.4 12.6 - 17.7 g/dL   Hematocrit 43.5 37.5 - 51.0 %   MCV 90 79 - 97 fL   MCH 29.7 26.6 - 33.0 pg   MCHC 33.1 31.5 - 35.7 g/dL   RDW 13.4 12.3 - 15.4 %   Platelets 190 150 - 379 x10E3/uL   Neutrophils 62 %   Lymphs 30 %   Monocytes 6 %   Eos 2 %   Basos 0 %   Neutrophils Absolute 5.8 1.4 - 7.0 x10E3/uL   Lymphocytes Absolute 2.8 0.7 - 3.1 x10E3/uL   Monocytes Absolute 0.5 0.1 - 0.9 x10E3/uL   EOS (ABSOLUTE) 0.2 0.0 - 0.4 x10E3/uL   Basophils Absolute 0.0 0.0 -  0.2 x10E3/uL   Immature Granulocytes 0 %   Immature Grans (Abs) 0.0 0.0 - 0.1 x10E3/uL      Assessment & Plan:   Problem List Items Addressed This Visit      Cardiovascular and Mediastinum   Hypertension    Stable, continue current medications.       Relevant Orders   Comprehensive metabolic panel   Uric acid   Microalbumin, Urine Waived     Digestive   Acid reflux    Stable, continue current medication.        Other   Obesity    Discussed need for change concerning diet and exercise. 2lb weight loss since last visit 08/23/16      Hypercholesterolemia    Lipid panel checked. Patient was non-fasting.      Relevant Orders   Lipid Panel w/o Chol/HDL Ratio    Other Visit Diagnoses    Need for influenza vaccination    -  Primary   Relevant Orders   Flu Vaccine QUAD 36+ mos  PF IM (Fluarix & Fluzone Quad PF) (Completed)       Follow up plan:

## 2016-09-25 NOTE — Assessment & Plan Note (Signed)
Discussed need for change concerning diet and exercise. 2lb weight loss since last visit 08/23/16

## 2016-09-25 NOTE — Assessment & Plan Note (Signed)
Stable, continue current medications.  

## 2016-09-26 ENCOUNTER — Encounter: Payer: Self-pay | Admitting: Unknown Physician Specialty

## 2016-09-26 LAB — COMPREHENSIVE METABOLIC PANEL
A/G RATIO: 2 (ref 1.2–2.2)
ALBUMIN: 4.5 g/dL (ref 3.6–4.8)
ALK PHOS: 121 IU/L — AB (ref 39–117)
ALT: 31 IU/L (ref 0–44)
AST: 25 IU/L (ref 0–40)
BUN / CREAT RATIO: 18 (ref 10–24)
BUN: 20 mg/dL (ref 8–27)
Bilirubin Total: 0.6 mg/dL (ref 0.0–1.2)
CO2: 27 mmol/L (ref 18–29)
CREATININE: 1.11 mg/dL (ref 0.76–1.27)
Calcium: 9.4 mg/dL (ref 8.6–10.2)
Chloride: 101 mmol/L (ref 96–106)
GFR calc Af Amer: 83 mL/min/{1.73_m2} (ref >59)
GFR calc non Af Amer: 72 mL/min/{1.73_m2} (ref >59)
GLOBULIN, TOTAL: 2.2 g/dL (ref 1.5–4.5)
Glucose: 135 mg/dL — ABNORMAL HIGH (ref 65–99)
POTASSIUM: 4.5 mmol/L (ref 3.5–5.2)
SODIUM: 141 mmol/L (ref 134–144)
Total Protein: 6.7 g/dL (ref 6.0–8.5)

## 2016-09-26 LAB — LIPID PANEL W/O CHOL/HDL RATIO
CHOLESTEROL TOTAL: 142 mg/dL (ref 100–199)
HDL: 33 mg/dL — ABNORMAL LOW (ref >39)
LDL CALC: 75 mg/dL (ref 0–99)
Triglycerides: 169 mg/dL — ABNORMAL HIGH (ref 0–149)
VLDL Cholesterol Cal: 34 mg/dL (ref 5–40)

## 2016-09-26 LAB — URIC ACID: URIC ACID: 4.7 mg/dL (ref 3.7–8.6)

## 2016-10-15 ENCOUNTER — Other Ambulatory Visit: Payer: Self-pay | Admitting: Unknown Physician Specialty

## 2016-12-03 ENCOUNTER — Encounter: Payer: Self-pay | Admitting: Unknown Physician Specialty

## 2016-12-03 ENCOUNTER — Ambulatory Visit (INDEPENDENT_AMBULATORY_CARE_PROVIDER_SITE_OTHER): Payer: BLUE CROSS/BLUE SHIELD | Admitting: Unknown Physician Specialty

## 2016-12-03 VITALS — BP 130/89 | HR 84 | Temp 99.9°F | Wt 254.6 lb

## 2016-12-03 DIAGNOSIS — J181 Lobar pneumonia, unspecified organism: Secondary | ICD-10-CM | POA: Diagnosis not present

## 2016-12-03 DIAGNOSIS — J029 Acute pharyngitis, unspecified: Secondary | ICD-10-CM | POA: Diagnosis not present

## 2016-12-03 DIAGNOSIS — J189 Pneumonia, unspecified organism: Secondary | ICD-10-CM

## 2016-12-03 MED ORDER — LEVOFLOXACIN 750 MG PO TABS: 750.0000 mg | ORAL_TABLET | Freq: Every day | ORAL | 0 refills | Status: DC

## 2016-12-03 MED ORDER — HYDROCOD POLST-CPM POLST ER 10-8 MG/5ML PO SUER: 5.0000 mL | Freq: Two times a day (BID) | ORAL | 0 refills | Status: DC | PRN

## 2016-12-03 NOTE — Progress Notes (Signed)
BP 130/89 (BP Location: Left Arm, Patient Position: Sitting, Cuff Size: Large)   Pulse 84   Temp 99.9 F (37.7 C)   Wt 254 lb 9.6 oz (115.5 kg)   SpO2 93%   BMI 34.72 kg/m    Subjective:    Patient ID: Brian Frederick, male    DOB: 1956/08/04, 60 y.o.   MRN: [id number]  HPI: RUXIN DASILVA is a 60 y.o. male  Chief Complaint  Patient presents with  . URI    pt states he has been having a cough, sore throat, ear ache, headache, and scratchy voice. States symptoms started Friday.    URI   This is a new problem. Episode onset: 3 days ago. Maximum temperature: around 100. Associated symptoms include congestion, coughing, rhinorrhea and a sore throat. He has tried decongestant and antihistamine for the symptoms. The treatment provided no relief.     Relevant past medical, surgical, family and social history reviewed and updated as indicated. Interim medical history since our last visit reviewed. Allergies and medications reviewed and updated.  Review of Systems  HENT: Positive for congestion, rhinorrhea and sore throat.   Respiratory: Positive for cough.     Per HPI unless specifically indicated above     Objective:    BP 130/89 (BP Location: Left Arm, Patient Position: Sitting, Cuff Size: Large)   Pulse 84   Temp 99.9 F (37.7 C)   Wt 254 lb 9.6 oz (115.5 kg)   SpO2 93%   BMI 34.72 kg/m   Wt Readings from Last 3 Encounters:  12/03/16 254 lb 9.6 oz (115.5 kg)  09/25/16 251 lb 3.2 oz (113.9 kg)  08/23/16 253 lb (114.8 kg)    Physical Exam  Constitutional: He is oriented to person, place, and time. He appears well-developed and well-nourished. No distress.  HENT:  Head: Normocephalic and atraumatic.  Right Ear: Tympanic membrane and ear canal normal.  Left Ear: Tympanic membrane and ear canal normal.  Nose: Rhinorrhea present. Right sinus exhibits no maxillary sinus tenderness and no frontal sinus tenderness. Left sinus exhibits no maxillary sinus tenderness and no  frontal sinus tenderness.  Mouth/Throat: Uvula is midline. Posterior oropharyngeal edema present.  Eyes: Conjunctivae and lids are normal. Right eye exhibits no discharge. Left eye exhibits no discharge. No scleral icterus.  Neck: Neck supple.  Cardiovascular: Normal rate, regular rhythm and normal heart sounds.   Pulmonary/Chest: Effort normal. No respiratory distress. He has rhonchi in the left lower field.  Abdominal: Normal appearance. There is no splenomegaly or hepatomegaly.  Musculoskeletal: Normal range of motion.  Neurological: He is alert and oriented to person, place, and time.  Skin: Skin is warm, dry and intact. No rash noted. No pallor.  Psychiatric: He has a normal mood and affect. His behavior is normal. Judgment and thought content normal.  Nursing note and vitals reviewed.   Results for orders placed or performed in visit on 09/25/16  Comprehensive metabolic panel  Result Value Ref Range   Glucose 135 (H) 65 - 99 mg/dL   BUN 20 8 - 27 mg/dL   Creatinine, Ser 1.11 0.76 - 1.27 mg/dL   GFR calc non Af Amer 72 >59 mL/min/1.73   GFR calc Af Amer 83 >59 mL/min/1.73   BUN/Creatinine Ratio 18 10 - 24   Sodium 141 134 - 144 mmol/L   Potassium 4.5 3.5 - 5.2 mmol/L   Chloride 101 96 - 106 mmol/L   CO2 27 18 - 29 mmol/L  Calcium 9.4 8.6 - 10.2 mg/dL   Total Protein 6.7 6.0 - 8.5 g/dL   Albumin 4.5 3.6 - 4.8 g/dL   Globulin, Total 2.2 1.5 - 4.5 g/dL   Albumin/Globulin Ratio 2.0 1.2 - 2.2   Bilirubin Total 0.6 0.0 - 1.2 mg/dL   Alkaline Phosphatase 121 (H) 39 - 117 IU/L   AST 25 0 - 40 IU/L   ALT 31 0 - 44 IU/L  Lipid Panel w/o Chol/HDL Ratio  Result Value Ref Range   Cholesterol, Total 142 100 - 199 mg/dL   Triglycerides 169 (H) 0 - 149 mg/dL   HDL 33 (L) >39 mg/dL   VLDL Cholesterol Cal 34 5 - 40 mg/dL   LDL Calculated 75 0 - 99 mg/dL  Uric acid  Result Value Ref Range   Uric Acid 4.7 3.7 - 8.6 mg/dL  Microalbumin, Urine Waived  Result Value Ref Range    Microalb, Ur Waived 30 (H) 0 - 19 mg/L   Creatinine, Urine Waived 200 10 - 300 mg/dL   Microalb/Creat Ratio <30 <30 mg/g      Assessment & Plan:   Problem List Items Addressed This Visit    None    Visit Diagnoses    Sore throat    -  Primary   Relevant Orders   Rapid strep screen (not at [facility])   Community acquired pneumonia of left lower lobe of lung ([place])       Relevant Medications   chlorpheniramine-HYDROcodone (TUSSIONEX PENNKINETIC ER) 10-8 MG/5ML SUER   levofloxacin (LEVAQUIN) 750 MG tablet   Other Relevant Orders   DG Chest 2 View       Follow up plan: Return if symptoms worsen or fail to improve.

## 2016-12-04 ENCOUNTER — Ambulatory Visit (INDEPENDENT_AMBULATORY_CARE_PROVIDER_SITE_OTHER): Payer: BLUE CROSS/BLUE SHIELD | Admitting: Internal Medicine

## 2016-12-04 ENCOUNTER — Ambulatory Visit
Admission: RE | Admit: 2016-12-04 | Discharge: 2016-12-04 | Disposition: A | Discharge status: Home / Self Care | Payer: BLUE CROSS/BLUE SHIELD | Source: Ambulatory Visit | Attending: Unknown Physician Specialty | Admitting: Unknown Physician Specialty

## 2016-12-04 ENCOUNTER — Encounter: Payer: Self-pay | Admitting: Internal Medicine

## 2016-12-04 VITALS — BP 122/84 | HR 70 | Ht 71.0 in | Wt 257.2 lb

## 2016-12-04 DIAGNOSIS — J181 Lobar pneumonia, unspecified organism: Principal | ICD-10-CM

## 2016-12-04 DIAGNOSIS — R6889 Other general symptoms and signs: Secondary | ICD-10-CM | POA: Diagnosis not present

## 2016-12-04 DIAGNOSIS — I10 Essential (primary) hypertension: Secondary | ICD-10-CM

## 2016-12-04 DIAGNOSIS — I471 Supraventricular tachycardia: Secondary | ICD-10-CM | POA: Diagnosis not present

## 2016-12-04 DIAGNOSIS — E782 Mixed hyperlipidemia: Secondary | ICD-10-CM | POA: Diagnosis not present

## 2016-12-04 DIAGNOSIS — J189 Pneumonia, unspecified organism: Secondary | ICD-10-CM

## 2016-12-04 DIAGNOSIS — R61 Generalized hyperhidrosis: Secondary | ICD-10-CM

## 2016-12-04 NOTE — Progress Notes (Signed)
New Outpatient Visit Date: 12/04/2016  Primary Care Provider: Kathrine Haddock, [place], [place] [address]  Chief Complaint: Palpitations  HPI:  [name] is a 60 y.o. year-old male with history of paroxysmal SVT status post ablation in 11/2013 as well as hypertension, hyperlipidemia, and prediabetes, who presents to reestablish cardiovascular care. He was previously followed by Dr. Saralyn Pilar of [facility]. The patient has felt well since his last visit with Dr. Saralyn Pilar in 2015. Specifically, he has not had any palpitations or other symptoms reminiscent of his previous SVT. Leading up to SVT ablation in 2014, he had had three episodes of sustained SVT. He initially developed flulike symptoms with fatigue and myalgias while in SVT. He was successfully cardioverted with IV adenosine. He had two subsequent episodes both requiring a adenosine. He has been maintained on metoprolol without any side effects. He denies chest pain, shortness of breath, palpitations, lightheadedness, orthopnea, PND, and edema. He has noticed a gradual decline in his stamina over the last few years, though he also attributes this to deconditioning and weight gain. He does not exercise regularly but is quite active as in [employer].  Currently, the patient is recovering from an upper respiratory tract infection, including sore throat and cough. He is on levofloxacin, prescribed by his PCP.  --------------------------------------------------------------------------------------------------  Cardiovascular History & Procedures: Cardiovascular Problems:  Paroxysmal supraventricular tachycardia  Risk Factors:  Hypertension, male gender, and age greater than 48  Cath/PCI:  None  CV Surgery:  None  EP Procedures and Devices:  EP study and SVT ablation (12/11/13, [facility])  Non-Invasive Evaluation(s):  None  Recent CV Pertinent Labs: Lab Results  Component Value Date   CHOL 142 09/25/2016   CHOL  136 09/23/2015   HDL 33 (L) 09/25/2016   LDLCALC 75 09/25/2016   TRIG 169 (H) 09/25/2016   TRIG 162 (H) 09/23/2015   K 4.5 09/25/2016   K 4.2 09/02/2013   BUN 20 09/25/2016   BUN 23 (H) 09/02/2013   CREATININE 1.11 09/25/2016   CREATININE 1.52 (H) 09/02/2013    --------------------------------------------------------------------------------------------------  Past Medical History:  Diagnosis Date  . Anxiety   . BPH (benign prostatic hypertrophy)   . Cancer ([place])    basal cell  . Chronic kidney disease   . Hypertension   . Hypogonadism in male   . Osteoarthritis   . SVT (supraventricular tachycardia) ([facility])     Past Surgical History:  Procedure Laterality Date  . BASAL CELL CARCINOMA EXCISION    . cardio oblation    . CHOLECYSTECTOMY    . JOINT REPLACEMENT Left    shoulder    Outpatient Encounter Prescriptions as of 12/04/2016  Medication Sig  . aspirin 81 MG tablet Take 81 mg by mouth daily.  [name] Kitchen atorvastatin (LIPITOR) 20 MG tablet Take 1 tablet (20 mg total) by mouth daily.  . chlorpheniramine-HYDROcodone (TUSSIONEX PENNKINETIC ER) 10-8 MG/5ML SUER Take 5 mLs by mouth every 12 (twelve) hours as needed for cough.  . clindamycin (CLEOCIN) 300 MG capsule take 2 capsules by mouth 1 hour before DENTIST OR OTHER PROCEDURES  . escitalopram (LEXAPRO) 10 MG tablet Take 1 tablet (10 mg total) by mouth daily.  . lansoprazole (PREVACID) 30 MG capsule Take 1 capsule (30 mg total) by mouth daily.  [name] Kitchen levofloxacin (LEVAQUIN) 750 MG tablet Take 750 mg by mouth daily.  . meloxicam (MOBIC) 15 MG tablet take 1 tablet by mouth once daily  . metoprolol succinate (TOPROL-XL) 100 MG 24 hr tablet take 1  tablet by mouth once daily  . traMADol (ULTRAM) 50 MG tablet Take 50 mg by mouth every 4 (four) hours as needed.  [name] Kitchen VIAGRA 100 MG tablet take 1 tablet by mouth daily if needed for ERECTILE DYSFUNCTION  . [DISCONTINUED] levofloxacin (LEVAQUIN) 750 MG tablet Take 1 tablet (750 mg total) by mouth  daily. (Patient not taking: Reported on 12/04/2016)   No facility-administered encounter medications on file as of 12/04/2016.     Allergies: Amoxil [amoxicillin] and Penicillin g benzathine  Social History   Social History  . Marital status: Married    Spouse name: N/A  . Number of children: N/A  . Years of education: N/A   Occupational History  . Not on file.   Social History Main Topics  . Smoking status: Never Smoker  . Smokeless tobacco: Never Used  . Alcohol use No  . Drug use: No  . Sexual activity: Yes   Other Topics Concern  . Not on file   Social History Narrative  . No narrative on file    Family History  Problem Relation Age of Onset  . Cancer Mother     breast  . Heart disease Father   . Diabetes Father   . CAD Father   . Hyperlipidemia Father   . Hypertension Father   . Cancer Sister     breast  . Diabetes Paternal Grandmother   . Hypothyroidism Sister   . Stroke Maternal Grandfather     Review of Systems: The patient reports heat intolerance and diaphoresis over the last few years. Otherwise, a 12-system review of systems was performed and was negative except as noted in the HPI.  --------------------------------------------------------------------------------------------------  Physical Exam: BP 122/84 (BP Location: Right Arm, Patient Position: Sitting, Cuff Size: Large)   Pulse 70   Ht 5\' 11"  (1.803 m)   Wt 257 lb 4 oz (116.7 kg)   BMI 35.88 kg/m   General:  Obese man, seated comfortably in the exam room. He is wearing a mask over his mouth. HEENT: No conjunctival pallor or scleral icterus.  Moist mucous membranes.  OP clear. Neck: Supple without lymphadenopathy, thyromegaly, JVD, or HJR, though evaluation is limited by body habitus.  No carotid bruit. Lungs: Normal work of breathing.  Coarse breath sounds without wheezes or crackles. Heart: Regular rate and rhythm without murmurs, rubs, or gallops.  Non-displaced PMI. Abd: Bowel  sounds present.  Soft, NT/ND without hepatosplenomegaly Ext: No lower extremity edema.  Radial, PT, and DP pulses are 2+ bilaterally Skin: warm and dry without rash Neuro: CNIII-XII intact.  Strength and fine-touch sensation intact in upper and lower extremities bilaterally. Psych: Normal mood and affect.  EKG:  Normal sinus rhythm without significant abnormalities. No change from prior tracing on 04/25/16.  Lab Results  Component Value Date   WBC 9.3 05/03/2016   HGB 15.0 09/02/2013   HCT 43.5 05/03/2016   MCV 90 05/03/2016   PLT 190 05/03/2016    Lab Results  Component Value Date   NA 141 09/25/2016   K 4.5 09/25/2016   CL 101 09/25/2016   CO2 27 09/25/2016   BUN 20 09/25/2016   CREATININE 1.11 09/25/2016   GLUCOSE 135 (H) 09/25/2016   ALT 31 09/25/2016    Lab Results  Component Value Date   CHOL 142 09/25/2016   HDL 33 (L) 09/25/2016   LDLCALC 75 09/25/2016   TRIG 169 (H) 09/25/2016   Lab Results  Component Value Date   TSH 2.380 03/23/2016   --------------------------------------------------------------------------------------------------  ASSESSMENT AND PLAN: Paroxysmal supraventricular tachycardia No symptoms to suggest recurrence following SVT ablation at [facility] in 2014. The patient should remain on metoprolol, which he has been tolerating well.  Hyperlipidemia Most recent lipid panel in 09/2016 revealed HDL 33 and LDL of 75. Given his cardiovascular risk factors, this is reasonable. He should maintain his current dose of atorvastatin. I have encouraged him to lose weight and exercise.  Hypertension Diastolic blood pressure is borderline elevated. We will not make any medication changes at this time. The patient should continue to follow-up with his PCP.  Diaphoresis and heat intolerance This has been an issue over the last few years for the patient. TSH in April was normal. I recommended that the patient follow-up with his PCP for further  evaluation.  Follow-Up: Return to clinic in one year.  Nelva Bush, MD 12/04/2016 10:53 AM

## 2016-12-04 NOTE — Patient Instructions (Signed)
Medication Instructions:   Continue current medications.   Follow-Up: Your physician wants you to follow-up in: 12 MONTHS WITH DR END.  You will receive a reminder letter in the mail two months in advance. If you don't receive a letter, please call our office to schedule the follow-up appointment.   If you need a refill on your cardiac medications before your next appointment, please call your pharmacy.

## 2016-12-05 ENCOUNTER — Telehealth: Payer: Self-pay | Admitting: Unknown Physician Specialty

## 2016-12-05 NOTE — Telephone Encounter (Signed)
Sorry, when I said to continue the Zpack, I thought he was on Zpacl [;us Amoxil.  I see instead, we started Levaquin.  Please have him just continue that

## 2016-12-05 NOTE — Telephone Encounter (Signed)
Routing to provider  

## 2016-12-05 NOTE — Telephone Encounter (Signed)
Ms Juliann Pulse called and stated that a zpac was suppose to be sent to [facility] [address] but they haven't received it.

## 2016-12-05 NOTE — Telephone Encounter (Signed)
Called and left patient a VM letting him know what Cheryl said.  

## 2016-12-13 LAB — CULTURE, GROUP A STREP: STREP A CULTURE: NEGATIVE

## 2016-12-13 LAB — RAPID STREP SCREEN (MED CTR MEBANE ONLY): Strep Gp A Ag, IA W/Reflex: NEGATIVE

## 2016-12-21 ENCOUNTER — Other Ambulatory Visit: Payer: Self-pay | Admitting: Unknown Physician Specialty

## 2016-12-21 NOTE — Telephone Encounter (Signed)
This medication was discontinued on 12/04/16 for completing the course.

## 2017-02-17 ENCOUNTER — Other Ambulatory Visit: Payer: Self-pay | Admitting: Unknown Physician Specialty

## 2017-03-27 ENCOUNTER — Encounter: Payer: Self-pay | Admitting: Unknown Physician Specialty

## 2017-03-27 ENCOUNTER — Ambulatory Visit (INDEPENDENT_AMBULATORY_CARE_PROVIDER_SITE_OTHER): Payer: BLUE CROSS/BLUE SHIELD | Admitting: Unknown Physician Specialty

## 2017-03-27 VITALS — BP 124/85 | HR 75 | Temp 98.9°F | Ht 71.2 in | Wt 251.3 lb

## 2017-03-27 DIAGNOSIS — I120 Hypertensive chronic kidney disease with stage 5 chronic kidney disease or end stage renal disease: Secondary | ICD-10-CM

## 2017-03-27 DIAGNOSIS — I10 Essential (primary) hypertension: Secondary | ICD-10-CM

## 2017-03-27 DIAGNOSIS — R7301 Impaired fasting glucose: Secondary | ICD-10-CM

## 2017-03-27 DIAGNOSIS — E7800 Pure hypercholesterolemia, unspecified: Secondary | ICD-10-CM

## 2017-03-27 DIAGNOSIS — N529 Male erectile dysfunction, unspecified: Secondary | ICD-10-CM | POA: Diagnosis not present

## 2017-03-27 DIAGNOSIS — M1990 Unspecified osteoarthritis, unspecified site: Secondary | ICD-10-CM | POA: Diagnosis not present

## 2017-03-27 DIAGNOSIS — E6609 Other obesity due to excess calories: Secondary | ICD-10-CM | POA: Diagnosis not present

## 2017-03-27 DIAGNOSIS — N185 Chronic kidney disease, stage 5: Secondary | ICD-10-CM

## 2017-03-27 DIAGNOSIS — Z6834 Body mass index (BMI) 34.0-34.9, adult: Secondary | ICD-10-CM | POA: Diagnosis not present

## 2017-03-27 DIAGNOSIS — R251 Tremor, unspecified: Secondary | ICD-10-CM | POA: Diagnosis not present

## 2017-03-27 DIAGNOSIS — Z0000 Encounter for general adult medical examination without abnormal findings: Secondary | ICD-10-CM

## 2017-03-27 DIAGNOSIS — K219 Gastro-esophageal reflux disease without esophagitis: Secondary | ICD-10-CM | POA: Diagnosis not present

## 2017-03-27 MED ORDER — SILDENAFIL CITRATE 100 MG PO TABS: 100.0000 mg | ORAL_TABLET | ORAL | 11 refills | Status: DC | PRN

## 2017-03-27 MED ORDER — METOPROLOL SUCCINATE ER 100 MG PO TB24: 100.0000 mg | ORAL_TABLET | Freq: Every day | ORAL | 3 refills | Status: DC

## 2017-03-27 MED ORDER — MELOXICAM 15 MG PO TABS: 15.0000 mg | ORAL_TABLET | Freq: Every day | ORAL | 3 refills | Status: DC

## 2017-03-27 MED ORDER — ESCITALOPRAM OXALATE 10 MG PO TABS: 10.0000 mg | ORAL_TABLET | Freq: Every day | ORAL | 1 refills | Status: DC

## 2017-03-27 NOTE — Assessment & Plan Note (Signed)
Stable, continue present medications.   

## 2017-03-27 NOTE — Assessment & Plan Note (Signed)
Seems benign as happens only in certain positions.  ? Complication of shoulder replacement.  Will continue to monitor.  Reassurance given

## 2017-03-27 NOTE — Assessment & Plan Note (Signed)
disussed diet and exercise

## 2017-03-27 NOTE — Assessment & Plan Note (Signed)
Continue Meloxicam and check GFR

## 2017-03-27 NOTE — Progress Notes (Signed)
BP 124/85 (BP Location: Left Arm, Patient Position: Sitting, Cuff Size: Large)   Pulse 75   Temp 98.9 F (37.2 C)   Ht 5' 11.2" (1.808 m) Comment: pt had boots on  Wt 251 lb 4.8 oz (114 kg) Comment: pt had boots on  SpO2 93%   BMI 34.85 kg/m    Subjective:    Patient ID: Brian Frederick, male    DOB: 1956-11-09, 61 y.o.   MRN: [id number]  HPI: Brian Frederick is a 61 y.o. male  Chief Complaint  Patient presents with  . Annual Exam   Hypertension Using medications without difficulty Average home BPs   No problems or lightheadedness No chest pain with exertion or shortness of breath No Edema  Hyperlipidemia Using medications without problems: No Muscle aches  Diet compliance: Tries to watch what he eats Exercise:Has 2 dogs which he walks  Joint pain Generalized joint pain mostly in right shoulder (s/p left shoulder replacement) and right knee.  Seeing Orthopedics.  Taking Meloxicam most days of the week but not everyday. Also seeing Orthopedics  Depression: Stable according to patient.  On Lexapro Depression screen [facility] 2/9 03/27/2017 09/25/2016 03/23/2016  Decreased Interest 0 0 0  Down, Depressed, Hopeless 0 1 0  PHQ - 2 Score 0 1 0  Altered sleeping 0 - -  Tired, decreased energy 1 - -  Change in appetite 0 - -  Feeling bad or failure about yourself  0 - -  Trouble concentrating 0 - -  Moving slowly or fidgety/restless 0 - -  Suicidal thoughts 0 - -  PHQ-9 Score 1 - -   GERD  Stable on present meds  ED Viagra works mostly if used as directed.    Social History   Social History  . Marital status: Married    Spouse name: N/A  . Number of children: N/A  . Years of education: N/A   Occupational History  . Not on file.   Social History Main Topics  . Smoking status: Never Smoker  . Smokeless tobacco: Never Used  . Alcohol use No  . Drug use: No  . Sexual activity: Yes   Other Topics Concern  . Not on file   Social History Narrative  . No  narrative on file   Family History  Problem Relation Age of Onset  . Cancer Mother     breast  . Heart disease Father 72    CABG  . Diabetes Father   . CAD Father   . Hyperlipidemia Father   . Hypertension Father   . Cancer Sister     breast  . Diabetes Paternal Grandmother   . Hypothyroidism Sister   . Stroke Maternal Grandfather    Past Medical History:  Diagnosis Date  . Anxiety   . BPH (benign prostatic hypertrophy)   . Cancer ([place])    basal cell  . Chronic kidney disease   . Hyperlipidemia   . Hypertension   . Hypogonadism in male   . Osteoarthritis   . Prediabetes   . SVT (supraventricular tachycardia) (HCC)    Past Surgical History:  Procedure Laterality Date  . BASAL CELL CARCINOMA EXCISION    . cardio oblation    . CHOLECYSTECTOMY    . JOINT REPLACEMENT Left    shoulder    Relevant past medical, surgical, family and social history reviewed and updated as indicated. Interim medical history since our last visit reviewed. Allergies and medications reviewed and updated.  Review of Systems  Constitutional: Negative.   HENT: Negative.   Eyes: Negative.   Respiratory: Negative.   Cardiovascular: Negative.   Gastrointestinal: Negative.   Endocrine: Negative.   Genitourinary: Negative.   Musculoskeletal:       Left hand tremor, mostly thumb, ever since shoulder replacement on that side in 2 months  Skin: Negative.   Neurological: Negative.   Hematological: Negative.   Psychiatric/Behavioral: Negative.     Per HPI unless specifically indicated above     Objective:    BP 124/85 (BP Location: Left Arm, Patient Position: Sitting, Cuff Size: Large)   Pulse 75   Temp 98.9 F (37.2 C)   Ht 5' 11.2" (1.808 m) Comment: pt had boots on  Wt 251 lb 4.8 oz (114 kg) Comment: pt had boots on  SpO2 93%   BMI 34.85 kg/m   Wt Readings from Last 3 Encounters:  03/27/17 251 lb 4.8 oz (114 kg)  12/04/16 257 lb 4 oz (116.7 kg)  12/03/16 254 lb 9.6 oz (115.5 kg)     Physical Exam  Constitutional: He is oriented to person, place, and time. He appears well-developed and well-nourished.  HENT:  Head: Normocephalic.  Right Ear: Tympanic membrane, external ear and ear canal normal.  Left Ear: Tympanic membrane, external ear and ear canal normal.  Mouth/Throat: Uvula is midline, oropharynx is clear and moist and mucous membranes are normal.  Eyes: Pupils are equal, round, and reactive to light.  Cardiovascular: Normal rate, regular rhythm and normal heart sounds.  Exam reveals no gallop and no friction rub.   No murmur heard. Pulmonary/Chest: Effort normal and breath sounds normal. No respiratory distress.  Abdominal: Soft. Bowel sounds are normal. He exhibits no distension. There is no tenderness.  Genitourinary: Rectum normal and prostate normal.  Musculoskeletal: Normal range of motion.  Neurological: He is alert and oriented to person, place, and time. He has normal reflexes.  Skin: Skin is warm and dry.  Psychiatric: He has a normal mood and affect. His behavior is normal. Judgment and thought content normal.       Assessment & Plan:   Problem List Items Addressed This Visit      Unprioritized   Acid reflux    Stable, continue present medications.        ED (erectile dysfunction)    Stable, continue present medications.        Hypercholesterolemia    Check Lipid panel.  Continue Atorvastatin with adjustments prn      Relevant Medications   metoprolol succinate (TOPROL-XL) 100 MG 24 hr tablet   sildenafil (VIAGRA) 100 MG tablet   Hypertension    Stable, continue present medications.        Relevant Medications   metoprolol succinate (TOPROL-XL) 100 MG 24 hr tablet   sildenafil (VIAGRA) 100 MG tablet   Obesity    disussed diet and exercise      Osteoarthritis    Continue Meloxicam and check GFR      Relevant Medications   meloxicam (MOBIC) 15 MG tablet   Tremor of left hand    Seems benign as happens only in certain  positions.  ? Complication of shoulder replacement.  Will continue to monitor.  Reassurance given       Other Visit Diagnoses    Annual physical exam    -  Primary   Relevant Orders   Comprehensive metabolic panel   CBC with Differential/Platelet   Lipid Panel w/o Chol/HDL Ratio  TSH   PSA   Ambulatory referral to Gastroenterology      Shared decision making as to PSA.  Pt wishes to have the PSA test  Follow up plan: Return in about 6 months (around 09/26/2017).

## 2017-03-27 NOTE — Assessment & Plan Note (Signed)
Check Lipid panel.  Continue Atorvastatin with adjustments prn

## 2017-03-28 LAB — COMPREHENSIVE METABOLIC PANEL
ALBUMIN: 4.4 g/dL (ref 3.6–4.8)
ALT: 32 IU/L (ref 0–44)
AST: 26 IU/L (ref 0–40)
Albumin/Globulin Ratio: 1.8 (ref 1.2–2.2)
Alkaline Phosphatase: 115 IU/L (ref 39–117)
BUN / CREAT RATIO: 16 (ref 10–24)
BUN: 22 mg/dL (ref 8–27)
Bilirubin Total: 1 mg/dL (ref 0.0–1.2)
CO2: 27 mmol/L (ref 18–29)
CREATININE: 1.37 mg/dL — AB (ref 0.76–1.27)
Calcium: 9.6 mg/dL (ref 8.6–10.2)
Chloride: 102 mmol/L (ref 96–106)
GFR calc Af Amer: 64 mL/min/{1.73_m2} (ref >59)
GFR calc non Af Amer: 55 mL/min/{1.73_m2} — ABNORMAL LOW (ref >59)
GLUCOSE: 116 mg/dL — AB (ref 65–99)
Globulin, Total: 2.4 g/dL (ref 1.5–4.5)
Potassium: 4.9 mmol/L (ref 3.5–5.2)
Sodium: 143 mmol/L (ref 134–144)
Total Protein: 6.8 g/dL (ref 6.0–8.5)

## 2017-03-28 LAB — CBC WITH DIFFERENTIAL/PLATELET
BASOS ABS: 0 10*3/uL (ref 0.0–0.2)
Basos: 0 %
EOS (ABSOLUTE): 0.2 10*3/uL (ref 0.0–0.4)
EOS: 2 %
HEMATOCRIT: 44.4 % (ref 37.5–51.0)
Hemoglobin: 14.9 g/dL (ref 13.0–17.7)
IMMATURE GRANULOCYTES: 1 %
Immature Grans (Abs): 0.1 10*3/uL (ref 0.0–0.1)
LYMPHS ABS: 1.8 10*3/uL (ref 0.7–3.1)
LYMPHS: 18 %
MCH: 28.8 pg (ref 26.6–33.0)
MCHC: 33.6 g/dL (ref 31.5–35.7)
MCV: 86 fL (ref 79–97)
MONOCYTES: 5 %
Monocytes Absolute: 0.5 10*3/uL (ref 0.1–0.9)
NEUTROS PCT: 74 %
Neutrophils Absolute: 7.4 10*3/uL — ABNORMAL HIGH (ref 1.4–7.0)
PLATELETS: 177 10*3/uL (ref 150–379)
RBC: 5.17 x10E6/uL (ref 4.14–5.80)
RDW: 14.6 % (ref 12.3–15.4)
WBC: 10 10*3/uL (ref 3.4–10.8)

## 2017-03-28 LAB — LIPID PANEL W/O CHOL/HDL RATIO
Cholesterol, Total: 155 mg/dL (ref 100–199)
HDL: 37 mg/dL — AB (ref >39)
LDL Calculated: 92 mg/dL (ref 0–99)
TRIGLYCERIDES: 130 mg/dL (ref 0–149)
VLDL Cholesterol Cal: 26 mg/dL (ref 5–40)

## 2017-03-28 LAB — PSA: Prostate Specific Ag, Serum: 0.2 ng/mL (ref 0.0–4.0)

## 2017-03-28 LAB — TSH: TSH: 2.03 u[IU]/mL (ref 0.450–4.500)

## 2017-03-29 ENCOUNTER — Telehealth: Payer: Self-pay

## 2017-03-29 ENCOUNTER — Other Ambulatory Visit: Payer: Self-pay

## 2017-03-29 DIAGNOSIS — Z1211 Encounter for screening for malignant neoplasm of colon: Secondary | ICD-10-CM

## 2017-03-29 DIAGNOSIS — I129 Hypertensive chronic kidney disease with stage 1 through stage 4 chronic kidney disease, or unspecified chronic kidney disease: Secondary | ICD-10-CM | POA: Insufficient documentation

## 2017-03-29 DIAGNOSIS — R7301 Impaired fasting glucose: Secondary | ICD-10-CM | POA: Insufficient documentation

## 2017-03-29 NOTE — Addendum Note (Signed)
Addended by: Kathrine Haddock on: 03/29/2017 07:47 AM   Modules accepted: Orders

## 2017-03-29 NOTE — Telephone Encounter (Signed)
Gastroenterology Pre-Procedure Review  Request Date:  Requesting Physician: Dr.   PATIENT REVIEW QUESTIONS: The patient responded to the following health history questions as indicated:    1. Are you having any GI issues? no 2. Do you have a personal history of Polyps? no 3. Do you have a family history of Colon Cancer or Polyps? no 4. Diabetes Mellitus? no 5. Joint replacements in the past 12 months?yes (Left shoulder replacement) 6. Major health problems in the past 3 months?no 7. Any artificial heart valves, MVP, or defibrillator?no    MEDICATIONS & ALLERGIES:    Patient reports the following regarding taking any anticoagulation/antiplatelet therapy:   Plavix, Coumadin, Eliquis, Xarelto, Lovenox, Pradaxa, Brilinta, or Effient? no Aspirin? ASA 81mg   Patient confirms/reports the following medications:  Current Outpatient Prescriptions  Medication Sig Dispense Refill  . aspirin 81 MG tablet Take 81 mg by mouth daily.    [name] Kitchen atorvastatin (LIPITOR) 20 MG tablet Take 1 tablet (20 mg total) by mouth daily. 90 tablet 3  . escitalopram (LEXAPRO) 10 MG tablet Take 1 tablet (10 mg total) by mouth daily. 90 tablet 1  . lansoprazole (PREVACID) 30 MG capsule take 1 capsule by mouth once daily 90 capsule 3  . meloxicam (MOBIC) 15 MG tablet Take 1 tablet (15 mg total) by mouth daily. 90 tablet 3  . metoprolol succinate (TOPROL-XL) 100 MG 24 hr tablet Take 1 tablet (100 mg total) by mouth daily. Take with or immediately following a meal. 90 tablet 3  . sildenafil (VIAGRA) 100 MG tablet Take 1 tablet (100 mg total) by mouth as needed for erectile dysfunction. 12 tablet 11   No current facility-administered medications for this visit.     Patient confirms/reports the following allergies:  Allergies  Allergen Reactions  . Amoxil [Amoxicillin] Swelling  . Penicillin G Benzathine Swelling    No orders of the defined types were placed in this encounter.   AUTHORIZATION INFORMATION Primary  Insurance: 1D#: Group #:  Secondary Insurance: 1D#: Group #:  SCHEDULE INFORMATION: Date: 04/12/17 Time: Location: [place]

## 2017-04-02 ENCOUNTER — Telehealth: Payer: Self-pay | Admitting: Gastroenterology

## 2017-04-02 NOTE — Telephone Encounter (Signed)
04/02/17 Spoke with Ulis Rias at [facility] and NO prior auth required for Screening Colonoscopy [phone] / K12.11

## 2017-04-08 NOTE — Discharge Instructions (Signed)

## 2017-04-12 ENCOUNTER — Ambulatory Visit: Payer: BLUE CROSS/BLUE SHIELD | Admitting: Anesthesiology

## 2017-04-12 ENCOUNTER — Telehealth: Payer: Self-pay | Admitting: Unknown Physician Specialty

## 2017-04-12 ENCOUNTER — Encounter
Admission: RE | Disposition: A | Discharge status: Home / Self Care | Payer: Self-pay | Source: Ambulatory Visit | Attending: Gastroenterology

## 2017-04-12 ENCOUNTER — Ambulatory Visit
Admission: RE | Admit: 2017-04-12 | Discharge: 2017-04-12 | Disposition: A | Discharge status: Home / Self Care | Payer: BLUE CROSS/BLUE SHIELD | Source: Ambulatory Visit | Attending: Gastroenterology | Admitting: Gastroenterology

## 2017-04-12 ENCOUNTER — Encounter: Payer: Self-pay | Admitting: Gastroenterology

## 2017-04-12 DIAGNOSIS — I129 Hypertensive chronic kidney disease with stage 1 through stage 4 chronic kidney disease, or unspecified chronic kidney disease: Secondary | ICD-10-CM | POA: Diagnosis not present

## 2017-04-12 DIAGNOSIS — Z7982 Long term (current) use of aspirin: Secondary | ICD-10-CM | POA: Insufficient documentation

## 2017-04-12 DIAGNOSIS — E785 Hyperlipidemia, unspecified: Secondary | ICD-10-CM | POA: Insufficient documentation

## 2017-04-12 DIAGNOSIS — Z1211 Encounter for screening for malignant neoplasm of colon: Secondary | ICD-10-CM | POA: Diagnosis not present

## 2017-04-12 DIAGNOSIS — N400 Enlarged prostate without lower urinary tract symptoms: Secondary | ICD-10-CM | POA: Diagnosis not present

## 2017-04-12 DIAGNOSIS — F419 Anxiety disorder, unspecified: Secondary | ICD-10-CM | POA: Diagnosis not present

## 2017-04-12 DIAGNOSIS — N189 Chronic kidney disease, unspecified: Secondary | ICD-10-CM | POA: Insufficient documentation

## 2017-04-12 DIAGNOSIS — I471 Supraventricular tachycardia: Secondary | ICD-10-CM | POA: Insufficient documentation

## 2017-04-12 DIAGNOSIS — Z85828 Personal history of other malignant neoplasm of skin: Secondary | ICD-10-CM | POA: Insufficient documentation

## 2017-04-12 DIAGNOSIS — M1990 Unspecified osteoarthritis, unspecified site: Secondary | ICD-10-CM | POA: Diagnosis not present

## 2017-04-12 DIAGNOSIS — D124 Benign neoplasm of descending colon: Secondary | ICD-10-CM

## 2017-04-12 DIAGNOSIS — G4733 Obstructive sleep apnea (adult) (pediatric): Secondary | ICD-10-CM

## 2017-04-12 DIAGNOSIS — R7303 Prediabetes: Secondary | ICD-10-CM | POA: Diagnosis not present

## 2017-04-12 DIAGNOSIS — K640 First degree hemorrhoids: Secondary | ICD-10-CM | POA: Insufficient documentation

## 2017-04-12 DIAGNOSIS — Z79899 Other long term (current) drug therapy: Secondary | ICD-10-CM | POA: Diagnosis not present

## 2017-04-12 DIAGNOSIS — K219 Gastro-esophageal reflux disease without esophagitis: Secondary | ICD-10-CM | POA: Diagnosis not present

## 2017-04-12 HISTORY — PX: POLYPECTOMY: SHX5525

## 2017-04-12 HISTORY — PX: COLONOSCOPY WITH PROPOFOL: SHX5780

## 2017-04-12 SURGERY — COLONOSCOPY WITH PROPOFOL
Anesthesia: Monitor Anesthesia Care

## 2017-04-12 MED ORDER — LACTATED RINGERS IV SOLN
INTRAVENOUS | Status: DC | PRN
  Administered 2017-04-12: 09:00:00 via INTRAVENOUS

## 2017-04-12 MED ORDER — STERILE WATER FOR IRRIGATION IR SOLN
Status: DC | PRN
  Administered 2017-04-12: 09:00:00

## 2017-04-12 MED ORDER — PROPOFOL 10 MG/ML IV BOLUS
INTRAVENOUS | Status: DC | PRN
  Administered 2017-04-12 (×5): 20 mg via INTRAVENOUS
  Administered 2017-04-12: 30 mg via INTRAVENOUS
  Administered 2017-04-12: 80 mg via INTRAVENOUS
  Administered 2017-04-12 (×2): 20 mg via INTRAVENOUS

## 2017-04-12 MED ORDER — LIDOCAINE HCL (CARDIAC) 20 MG/ML IV SOLN
INTRAVENOUS | Status: DC | PRN
  Administered 2017-04-12: 50 mg via INTRAVENOUS

## 2017-04-12 SURGICAL SUPPLY — 23 items

## 2017-04-12 NOTE — Anesthesia Postprocedure Evaluation (Signed)
Anesthesia Post Note  Patient: Brian Frederick  Procedure(s) Performed: Procedure(s) (LRB): COLONOSCOPY WITH PROPOFOL (N/A) POLYPECTOMY (N/A)  Patient location during evaluation: PACU Anesthesia Type: MAC Level of consciousness: awake and alert Pain management: pain level controlled Vital Signs Assessment: post-procedure vital signs reviewed and stable Respiratory status: spontaneous breathing, nonlabored ventilation, respiratory function stable and patient connected to nasal cannula oxygen Cardiovascular status: stable and blood pressure returned to baseline Anesthetic complications: no    Amaryllis Dyke

## 2017-04-12 NOTE — Telephone Encounter (Signed)
Patients wife called regarding getting a ref for the patient for a sleep study.  She states Malachy Mood had recommended him having one and during the colonoscopy the physician suggested it would be a good idea.  Thank you  [phone]

## 2017-04-12 NOTE — Anesthesia Preprocedure Evaluation (Signed)
Anesthesia Evaluation  Patient identified by MRN, date of birth, ID band Patient awake    Airway Mallampati: II  TM Distance: >3 FB Neck ROM: full    Dental   Pulmonary    Pulmonary exam normal        Cardiovascular hypertension, Normal cardiovascular exam     Neuro/Psych    GI/Hepatic GERD  ,  Endo/Other    Renal/GU Renal disease     Musculoskeletal   Abdominal   Peds  Hematology   Anesthesia Other Findings   Reproductive/Obstetrics                             Anesthesia Physical Anesthesia Plan  ASA: III  Anesthesia Plan: MAC   Post-op Pain Management:    Induction:   Airway Management Planned:   Additional Equipment:   Intra-op Plan:   Post-operative Plan:   Informed Consent: I have reviewed the patients History and Physical, chart, labs and discussed the procedure including the risks, benefits and alternatives for the proposed anesthesia with the patient or authorized representative who has indicated his/her understanding and acceptance.     Plan Discussed with: CRNA  Anesthesia Plan Comments:         Anesthesia Quick Evaluation

## 2017-04-12 NOTE — Op Note (Signed)
[facility] Patient Name: Brian Frederick Procedure Date: 04/12/2017 9:01 AM MRN: [id number] Account #: [id number] Date of Birth: 12/31/1955 Admit Type: Outpatient Age: 61 Room: [facility] OR ROOM 01 Gender: Male Note Status: Finalized Procedure:            Colonoscopy Indications:          Screening for colorectal malignant neoplasm Providers:            [name] MD, MD Referring MD:         Kathrine Haddock (Referring MD) Medicines:            Propofol per Anesthesia Complications:        No immediate complications. Procedure:            Pre-Anesthesia Assessment:                       - Prior to the procedure, a History and Physical was                        performed, and patient medications and allergies were                        reviewed. The patient's tolerance of previous                        anesthesia was also reviewed. The risks and benefits of                        the procedure and the sedation options and risks were                        discussed with the patient. All questions were                        answered, and informed consent was obtained. Prior                        Anticoagulants: The patient has taken no previous                        anticoagulant or antiplatelet agents. ASA Grade                        Assessment: II - A patient with mild systemic disease.                        After reviewing the risks and benefits, the patient was                        deemed in satisfactory condition to undergo the                        procedure.                       After obtaining informed consent, the colonoscope was                        passed under direct vision. Throughout the procedure,  the patient's blood pressure, pulse, and oxygen                        saturations were monitored continuously. The Olympus                        Colonoscope 190 [id number]) was introduced through the                         anus and advanced to the the cecum, identified by                        appendiceal orifice and ileocecal valve. The                        colonoscopy was performed without difficulty. The                        patient tolerated the procedure well. The quality of                        the bowel preparation was excellent. Findings:      The perianal and digital rectal examinations were normal.      Two sessile polyps were found in the descending colon. The polyps were 3       to 4 mm in size. These polyps were removed with a cold snare. Resection       and retrieval were complete.      Non-bleeding internal hemorrhoids were found during retroflexion. The       hemorrhoids were Grade I (internal hemorrhoids that do not prolapse). Impression:           - Two 3 to 4 mm polyps in the descending colon, removed                        with a cold snare. Resected and retrieved.                       - Non-bleeding internal hemorrhoids. Recommendation:       - Discharge patient to home.                       - Resume previous diet.                       - Continue present medications.                       - Await pathology results.                       - Repeat colonoscopy in 5 years if polyp adenoma and 10                        years if hyperplastic Procedure Code(s):    --- Professional ---                       [phone], Colonoscopy, flexible; with removal of tumor(s),                        polyp(s), or other lesion(s) by  snare technique Diagnosis Code(s):    --- Professional ---                       Z12.11, Encounter for screening for malignant neoplasm                        of colon                       D12.4, Benign neoplasm of descending colon CPT copyright 2016 American Medical Association. All rights reserved. The codes documented in this report are preliminary and upon coder review may  be revised to meet current compliance requirements. [name] MD,  MD 04/12/2017 9:25:16 AM This report has been signed electronically. Number of Addenda: 0 Note Initiated On: 04/12/2017 9:01 AM Scope Withdrawal Time: 0 hours 8 minutes 16 seconds  Total Procedure Duration: 0 hours 11 minutes 0 seconds       [facility]

## 2017-04-12 NOTE — H&P (Signed)
Brian Lame, MD [facility] [address], [place], [place] [address] Phone: [phone] Fax : [phone]  Primary Care Physician:  Kathrine Haddock, NP Primary Gastroenterologist:  Dr. Allen Norris  Pre-Procedure History & Physical: HPI:  Brian Frederick is a 61 y.o. male is here for a screening colonoscopy.   Past Medical History:  Diagnosis Date  . Anxiety   . BPH (benign prostatic hypertrophy)   . Cancer ([place])    basal cell  . Chronic kidney disease   . Hyperlipidemia   . Hypertension   . Hypogonadism in male   . Osteoarthritis   . Prediabetes   . SVT (supraventricular tachycardia) (HCC)     Past Surgical History:  Procedure Laterality Date  . BASAL CELL CARCINOMA EXCISION    . cardio oblation    . CHOLECYSTECTOMY    . JOINT REPLACEMENT Left    shoulder  . SHOULDER ARTHROSCOPY      Prior to Admission medications   Medication Sig Start Date End Date Taking? Authorizing Provider  aspirin 81 MG tablet Take 81 mg by mouth daily.   Yes Historical Provider, MD  atorvastatin (LIPITOR) 20 MG tablet Take 1 tablet (20 mg total) by mouth daily. 09/25/16  Yes Kathrine Haddock, NP  escitalopram (LEXAPRO) 10 MG tablet Take 1 tablet (10 mg total) by mouth daily. 03/27/17  Yes Kathrine Haddock, NP  lansoprazole (PREVACID) 30 MG capsule take 1 capsule by mouth once daily 02/18/17  Yes Kathrine Haddock, NP  meloxicam (MOBIC) 15 MG tablet Take 1 tablet (15 mg total) by mouth daily. 03/27/17  Yes Kathrine Haddock, NP  metoprolol succinate (TOPROL-XL) 100 MG 24 hr tablet Take 1 tablet (100 mg total) by mouth daily. Take with or immediately following a meal. 03/27/17  Yes Kathrine Haddock, NP  sildenafil (VIAGRA) 100 MG tablet Take 1 tablet (100 mg total) by mouth as needed for erectile dysfunction. 03/27/17  Yes Kathrine Haddock, NP    Allergies as of 03/29/2017 - Review Complete 03/27/2017  Allergen Reaction Noted  . Amoxil [amoxicillin] Swelling 09/22/2015  . Penicillin g benzathine Swelling 09/22/2015     Family History  Problem Relation Age of Onset  . Cancer Mother     breast  . Heart disease Father 53    CABG  . Diabetes Father   . CAD Father   . Hyperlipidemia Father   . Hypertension Father   . Cancer Sister     breast  . Diabetes Paternal Grandmother   . Hypothyroidism Sister   . Stroke Maternal Grandfather     Social History   Social History  . Marital status: Married    Spouse name: N/A  . Number of children: N/A  . Years of education: N/A   Occupational History  . Not on file.   Social History Main Topics  . Smoking status: Never Smoker  . Smokeless tobacco: Never Used  . Alcohol use No  . Drug use: No  . Sexual activity: Yes   Other Topics Concern  . Not on file   Social History Narrative  . No narrative on file    Review of Systems: See HPI, otherwise negative ROS  Physical Exam: BP 134/84   Pulse 83   Temp 98.2 F (36.8 C) (Temporal)   Resp 16   Ht 5\' 11"  (1.803 m)   Wt 244 lb (110.7 kg)   SpO2 96%   BMI 34.03 kg/m  General:   Alert,  pleasant and cooperative in NAD Head:  Normocephalic and atraumatic. Neck:  Supple; no masses or thyromegaly. Lungs:  Clear throughout to auscultation.    Heart:  Regular rate and rhythm. Abdomen:  Soft, nontender and nondistended. Normal bowel sounds, without guarding, and without rebound.   Neurologic:  Alert and  oriented x4;  grossly normal neurologically.  Impression/Plan: Brian Frederick is now here to undergo a screening colonoscopy.  Risks, benefits, and alternatives regarding colonoscopy have been reviewed with the patient.  Questions have been answered.  All parties agreeable.

## 2017-04-12 NOTE — Anesthesia Procedure Notes (Signed)
Procedure Name: MAC Performed by: Mayme Genta Pre-anesthesia Checklist: Patient identified, Emergency Drugs available, Suction available, Timeout performed and Patient being monitored Patient Re-evaluated:Patient Re-evaluated prior to inductionOxygen Delivery Method: Nasal cannula Placement Confirmation: positive ETCO2

## 2017-04-12 NOTE — Telephone Encounter (Signed)
Routing to provider  

## 2017-04-12 NOTE — Transfer of Care (Signed)
Immediate Anesthesia Transfer of Care Note  Patient: Brian Frederick  Procedure(s) Performed: Procedure(s): COLONOSCOPY WITH PROPOFOL (N/A) POLYPECTOMY (N/A)  Patient Location: PACU  Anesthesia Type: MAC  Level of Consciousness: awake, alert  and patient cooperative  Airway and Oxygen Therapy: Patient Spontanous Breathing and Patient connected to supplemental oxygen  Post-op Assessment: Post-op Vital signs reviewed, Patient's Cardiovascular Status Stable, Respiratory Function Stable, Patent Airway and No signs of Nausea or vomiting  Post-op Vital Signs: Reviewed and stable  Complications: No apparent anesthesia complications

## 2017-04-12 NOTE — Telephone Encounter (Signed)
ERROR

## 2017-04-12 NOTE — Telephone Encounter (Signed)
Called and let wife know that referral for sleep study was entered.

## 2017-04-16 ENCOUNTER — Encounter: Payer: Self-pay | Admitting: Unknown Physician Specialty

## 2017-04-16 ENCOUNTER — Encounter: Payer: Self-pay | Admitting: Gastroenterology

## 2017-04-16 ENCOUNTER — Ambulatory Visit (INDEPENDENT_AMBULATORY_CARE_PROVIDER_SITE_OTHER): Payer: BLUE CROSS/BLUE SHIELD | Admitting: Unknown Physician Specialty

## 2017-04-16 VITALS — BP 122/84 | HR 85 | Temp 99.6°F | Wt 250.8 lb

## 2017-04-16 DIAGNOSIS — R7301 Impaired fasting glucose: Secondary | ICD-10-CM

## 2017-04-16 DIAGNOSIS — J029 Acute pharyngitis, unspecified: Secondary | ICD-10-CM

## 2017-04-16 DIAGNOSIS — J0100 Acute maxillary sinusitis, unspecified: Secondary | ICD-10-CM | POA: Diagnosis not present

## 2017-04-16 DIAGNOSIS — I129 Hypertensive chronic kidney disease with stage 1 through stage 4 chronic kidney disease, or unspecified chronic kidney disease: Secondary | ICD-10-CM

## 2017-04-16 DIAGNOSIS — I120 Hypertensive chronic kidney disease with stage 5 chronic kidney disease or end stage renal disease: Secondary | ICD-10-CM

## 2017-04-16 DIAGNOSIS — N185 Chronic kidney disease, stage 5: Secondary | ICD-10-CM | POA: Diagnosis not present

## 2017-04-16 MED ORDER — HYDROCOD POLST-CPM POLST ER 10-8 MG/5ML PO SUER: 5.0000 mL | Freq: Two times a day (BID) | ORAL | 0 refills | Status: DC | PRN

## 2017-04-16 MED ORDER — AZITHROMYCIN 250 MG PO TABS: ORAL_TABLET | ORAL | 0 refills | Status: DC

## 2017-04-16 NOTE — Assessment & Plan Note (Signed)
Check Hgb A1C 

## 2017-04-16 NOTE — Progress Notes (Signed)
BP 122/84   Pulse 85   Temp 99.6 F (37.6 C)   Wt 250 lb 12.8 oz (113.8 kg)   SpO2 94%   BMI 34.98 kg/m    Subjective:    Patient ID: Brian Frederick, male    DOB: 11-23-1956, 61 y.o.   MRN: [id number]  HPI: Brian Frederick is a 61 y.o. male  Chief Complaint  Patient presents with  . Sore Throat    pt states that he has had a sore throat since around 03/24/17, states he has also had a cough and swollen neck    Cough  This is a new (was told by GI he coughed under anesthesia and should get it checked out) problem. Episode onset: 3 weeks ago. The problem has been gradually worsening. The problem occurs constantly. The cough is productive of sputum. Associated symptoms include ear congestion, nasal congestion, postnasal drip and a sore throat. Treatments tried: gargling. The treatment provided no relief.   abnormal labs Needs another CMP and Hgb A1C for elevated GFR and blood sugar  Relevant past medical, surgical, family and social history reviewed and updated as indicated. Interim medical history since our last visit reviewed. Allergies and medications reviewed and updated.  Review of Systems  HENT: Positive for postnasal drip and sore throat.   Respiratory: Positive for cough.     Per HPI unless specifically indicated above     Objective:    BP 122/84   Pulse 85   Temp 99.6 F (37.6 C)   Wt 250 lb 12.8 oz (113.8 kg)   SpO2 94%   BMI 34.98 kg/m   Wt Readings from Last 3 Encounters:  04/16/17 250 lb 12.8 oz (113.8 kg)  04/12/17 244 lb (110.7 kg)  03/27/17 251 lb 4.8 oz (114 kg)    Physical Exam  Constitutional: He is oriented to person, place, and time. He appears well-developed and well-nourished. No distress.  HENT:  Head: Normocephalic and atraumatic.  Right Ear: Tympanic membrane and ear canal normal.  Left Ear: Tympanic membrane and ear canal normal.  Nose: Mucosal edema and sinus tenderness present. Right sinus exhibits maxillary sinus tenderness. Left  sinus exhibits maxillary sinus tenderness.  Mouth/Throat: Mucous membranes are normal. Oropharyngeal exudate and posterior oropharyngeal edema present.  Eyes: Conjunctivae and lids are normal. Right eye exhibits no discharge. Left eye exhibits no discharge. No scleral icterus.  Cardiovascular: Normal rate and regular rhythm.   Pulmonary/Chest: Effort normal. No respiratory distress.  Abdominal: Normal appearance and bowel sounds are normal. He exhibits no distension. There is no splenomegaly or hepatomegaly. There is no tenderness.  Musculoskeletal: Normal range of motion.  Neurological: He is alert and oriented to person, place, and time.  Skin: Skin is intact. No rash noted. No pallor.  Psychiatric: He has a normal mood and affect. His behavior is normal. Judgment and thought content normal.    Results for orders placed or performed in visit on 03/27/17  Comprehensive metabolic panel  Result Value Ref Range   Glucose 116 (H) 65 - 99 mg/dL   BUN 22 8 - 27 mg/dL   Creatinine, Ser 1.37 (H) 0.76 - 1.27 mg/dL   GFR calc non Af Amer 55 (L) >59 mL/min/1.73   GFR calc Af Amer 64 >59 mL/min/1.73   BUN/Creatinine Ratio 16 10 - 24   Sodium 143 134 - 144 mmol/L   Potassium 4.9 3.5 - 5.2 mmol/L   Chloride 102 96 - 106 mmol/L   CO2 27  18 - 29 mmol/L   Calcium 9.6 8.6 - 10.2 mg/dL   Total Protein 6.8 6.0 - 8.5 g/dL   Albumin 4.4 3.6 - 4.8 g/dL   Globulin, Total 2.4 1.5 - 4.5 g/dL   Albumin/Globulin Ratio 1.8 1.2 - 2.2   Bilirubin Total 1.0 0.0 - 1.2 mg/dL   Alkaline Phosphatase 115 39 - 117 IU/L   AST 26 0 - 40 IU/L   ALT 32 0 - 44 IU/L  CBC with Differential/Platelet  Result Value Ref Range   WBC 10.0 3.4 - 10.8 x10E3/uL   RBC 5.17 4.14 - 5.80 x10E6/uL   Hemoglobin 14.9 13.0 - 17.7 g/dL   Hematocrit 44.4 37.5 - 51.0 %   MCV 86 79 - 97 fL   MCH 28.8 26.6 - 33.0 pg   MCHC 33.6 31.5 - 35.7 g/dL   RDW 14.6 12.3 - 15.4 %   Platelets 177 150 - 379 x10E3/uL   Neutrophils 74 Not Estab. %    Lymphs 18 Not Estab. %   Monocytes 5 Not Estab. %   Eos 2 Not Estab. %   Basos 0 Not Estab. %   Neutrophils Absolute 7.4 (H) 1.4 - 7.0 x10E3/uL   Lymphocytes Absolute 1.8 0.7 - 3.1 x10E3/uL   Monocytes Absolute 0.5 0.1 - 0.9 x10E3/uL   EOS (ABSOLUTE) 0.2 0.0 - 0.4 x10E3/uL   Basophils Absolute 0.0 0.0 - 0.2 x10E3/uL   Immature Granulocytes 1 Not Estab. %   Immature Grans (Abs) 0.1 0.0 - 0.1 x10E3/uL  Lipid Panel w/o Chol/HDL Ratio  Result Value Ref Range   Cholesterol, Total 155 100 - 199 mg/dL   Triglycerides 130 0 - 149 mg/dL   HDL 37 (L) >39 mg/dL   VLDL Cholesterol Cal 26 5 - 40 mg/dL   LDL Calculated 92 0 - 99 mg/dL  TSH  Result Value Ref Range   TSH 2.030 0.450 - 4.500 uIU/mL  PSA  Result Value Ref Range   Prostate Specific Ag, Serum 0.2 0.0 - 4.0 ng/mL      Assessment & Plan:   Problem List Items Addressed This Visit      Unprioritized   Benign hypertension with chronic kidney disease    Check CMP      IFG (impaired fasting glucose)    Check Hgb A1C       Other Visit Diagnoses    Sore throat    -  Primary   Relevant Orders   Rapid strep screen (not at [facility])   Acute non-recurrent maxillary sinusitis       Relevant Medications   azithromycin (ZITHROMAX Z-PAK) 250 MG tablet   chlorpheniramine-HYDROcodone (TUSSIONEX PENNKINETIC ER) 10-8 MG/5ML SUER       Follow up plan: Return if symptoms worsen or fail to improve.

## 2017-04-16 NOTE — Assessment & Plan Note (Signed)
Check CMP.  ?

## 2017-04-17 ENCOUNTER — Telehealth: Payer: Self-pay | Admitting: Unknown Physician Specialty

## 2017-04-17 ENCOUNTER — Encounter: Payer: Self-pay | Admitting: Unknown Physician Specialty

## 2017-04-17 LAB — COMPREHENSIVE METABOLIC PANEL
A/G RATIO: 1.6 (ref 1.2–2.2)
ALBUMIN: 4.2 g/dL (ref 3.6–4.8)
ALT: 26 IU/L (ref 0–44)
AST: 28 IU/L (ref 0–40)
Alkaline Phosphatase: 94 IU/L (ref 39–117)
BUN/Creatinine Ratio: 14 (ref 10–24)
BUN: 16 mg/dL (ref 8–27)
Bilirubin Total: 0.4 mg/dL (ref 0.0–1.2)
CALCIUM: 9.8 mg/dL (ref 8.6–10.2)
CO2: 27 mmol/L (ref 18–29)
Chloride: 103 mmol/L (ref 96–106)
Creatinine, Ser: 1.17 mg/dL (ref 0.76–1.27)
GFR, EST AFRICAN AMERICAN: 77 mL/min/{1.73_m2} (ref >59)
GFR, EST NON AFRICAN AMERICAN: 67 mL/min/{1.73_m2} (ref >59)
GLOBULIN, TOTAL: 2.7 g/dL (ref 1.5–4.5)
Glucose: 85 mg/dL (ref 65–99)
POTASSIUM: 4.6 mmol/L (ref 3.5–5.2)
Sodium: 147 mmol/L — ABNORMAL HIGH (ref 134–144)
TOTAL PROTEIN: 6.9 g/dL (ref 6.0–8.5)

## 2017-04-17 LAB — HGB A1C W/O EAG: Hgb A1c MFr Bld: 6.3 % — ABNORMAL HIGH (ref 4.8–5.6)

## 2017-04-17 NOTE — Telephone Encounter (Signed)
Discussed with pt about Hgb A1C.  Discussed diet and exercise.  Will reheck in 6 months

## 2017-04-21 LAB — CULTURE, GROUP A STREP: Strep A Culture: NEGATIVE

## 2017-04-21 LAB — RAPID STREP SCREEN (MED CTR MEBANE ONLY): Strep Gp A Ag, IA W/Reflex: NEGATIVE

## 2017-05-10 ENCOUNTER — Telehealth: Payer: Self-pay | Admitting: Unknown Physician Specialty

## 2017-05-10 NOTE — Telephone Encounter (Signed)
OK to wait for [facility]

## 2017-05-10 NOTE — Telephone Encounter (Signed)
Patient is requesting a referral for sleep study.   He does not want an appt until after June 03, 2017.  I explained cheryl was away and they said it would be fine for Malachy Mood to contact him when she returns.  Thanks  [phone]

## 2017-05-10 NOTE — Telephone Encounter (Signed)
Routing to provider  

## 2017-05-14 NOTE — Telephone Encounter (Signed)
I have the fax confirmation. I shred the notes to save room. I always scan the order form, but maybe I accidentally shredded it. I will fill out another form and get CW to sign tomorrow.

## 2017-05-14 NOTE — Telephone Encounter (Signed)
Called and spoke to [place] at [facility] because according to chart, sleep study referral was faxed to [facility] 04/15/17. Larene [place] stated that they did not get this referral and asked if we could resend it. Keri, do you still have the forms? I did not see them in the chart.

## 2017-05-15 NOTE — Telephone Encounter (Signed)
Form signed by Malachy Mood and faxed to [employer].

## 2017-05-15 NOTE — Telephone Encounter (Signed)
Tried calling patient's wife, no answer and not authorized to leave VM on that number according to [facility]. So I called and left a detailed VM on patient's cell letting him know that referral has been faxed and that they should be calling him with an appointment. I also left [facility] number on the VM so patient could call if needed. Asked for him to give [place] a call with any questions or concerns.

## 2017-07-17 ENCOUNTER — Other Ambulatory Visit: Payer: Self-pay | Admitting: Unknown Physician Specialty

## 2017-08-12 DIAGNOSIS — M19019 Primary osteoarthritis, unspecified shoulder: Secondary | ICD-10-CM | POA: Insufficient documentation

## 2017-08-18 ENCOUNTER — Other Ambulatory Visit: Payer: Self-pay | Admitting: Unknown Physician Specialty

## 2017-09-27 ENCOUNTER — Ambulatory Visit: Payer: BLUE CROSS/BLUE SHIELD | Admitting: Unknown Physician Specialty

## 2017-10-25 ENCOUNTER — Ambulatory Visit: Payer: BLUE CROSS/BLUE SHIELD | Admitting: Unknown Physician Specialty

## 2017-10-25 ENCOUNTER — Encounter: Payer: Self-pay | Admitting: Unknown Physician Specialty

## 2017-10-25 VITALS — BP 123/82 | HR 62 | Temp 98.4°F | Wt 257.8 lb

## 2017-10-25 DIAGNOSIS — N400 Enlarged prostate without lower urinary tract symptoms: Secondary | ICD-10-CM

## 2017-10-25 DIAGNOSIS — I10 Essential (primary) hypertension: Secondary | ICD-10-CM

## 2017-10-25 DIAGNOSIS — E7800 Pure hypercholesterolemia, unspecified: Secondary | ICD-10-CM

## 2017-10-25 DIAGNOSIS — R251 Tremor, unspecified: Secondary | ICD-10-CM | POA: Diagnosis not present

## 2017-10-25 DIAGNOSIS — I129 Hypertensive chronic kidney disease with stage 1 through stage 4 chronic kidney disease, or unspecified chronic kidney disease: Secondary | ICD-10-CM

## 2017-10-25 DIAGNOSIS — R7301 Impaired fasting glucose: Secondary | ICD-10-CM

## 2017-10-25 DIAGNOSIS — N529 Male erectile dysfunction, unspecified: Secondary | ICD-10-CM

## 2017-10-25 DIAGNOSIS — K219 Gastro-esophageal reflux disease without esophagitis: Secondary | ICD-10-CM

## 2017-10-25 LAB — BAYER DCA HB A1C WAIVED: HB A1C: 6 % (ref <7.0)

## 2017-10-25 MED ORDER — TADALAFIL 2.5 MG PO TABS: 2.5000 mg | ORAL_TABLET | Freq: Every day | ORAL | 12 refills | Status: DC

## 2017-10-25 NOTE — Progress Notes (Signed)
BP 123/82   Pulse 62   Temp 98.4 F (36.9 C) (Oral)   Wt 257 lb 12.8 oz (116.9 kg)   SpO2 97%   BMI 35.96 kg/m    Subjective:    Patient ID: Brian Frederick, male    DOB: 04-14-1956, 61 y.o.   MRN: [id number]  HPI: Brian Frederick is a 61 y.o. male  Chief Complaint  Patient presents with  . Gastroesophageal Reflux  . Hyperlipidemia  . Hypertension   Hypertension Using medications without difficulty Average home BPs Not checking   No problems or lightheadedness No chest pain with exertion or shortness of breath No Edema   Hyperlipidemia Using medications without problems: No Muscle aches  Diet compliance:Exercise:Needs to do more  GERD Stable  ED History of BPH and initially put on daily Cialis.  Would like to restart his Cialis for help with ED as well.  Get up once at night to use the bathroom.    Left arm tremor Ever since shoulder surgery.  Told it was due to scar tissue.  Has not seen a cardiologist  Relevant past medical, surgical, family and social history reviewed and updated as indicated. Interim medical history since our last visit reviewed. Allergies and medications reviewed and updated.  Review of Systems  Per HPI unless specifically indicated above     Objective:    BP 123/82   Pulse 62   Temp 98.4 F (36.9 C) (Oral)   Wt 257 lb 12.8 oz (116.9 kg)   SpO2 97%   BMI 35.96 kg/m   Wt Readings from Last 3 Encounters:  10/25/17 257 lb 12.8 oz (116.9 kg)  04/16/17 250 lb 12.8 oz (113.8 kg)  04/12/17 244 lb (110.7 kg)    Physical Exam  Constitutional: He is oriented to person, place, and time. He appears well-developed and well-nourished. No distress.  HENT:  Head: Normocephalic and atraumatic.  Eyes: Conjunctivae and lids are normal. Right eye exhibits no discharge. Left eye exhibits no discharge. No scleral icterus.  Neck: Normal range of motion. Neck supple. No JVD present. Carotid bruit is not present.  Cardiovascular: Normal rate,  regular rhythm and normal heart sounds.  Pulmonary/Chest: Effort normal and breath sounds normal. No respiratory distress.  Abdominal: Normal appearance. There is no splenomegaly or hepatomegaly.  Musculoskeletal: Normal range of motion.  Neurological: He is alert and oriented to person, place, and time.  Skin: Skin is warm, dry and intact. No rash noted. No pallor.  Psychiatric: He has a normal mood and affect. His behavior is normal. Judgment and thought content normal.    Results for orders placed or performed in visit on 04/16/17  Rapid strep screen (not at [facility])  Result Value Ref Range   Strep Gp A Ag, IA W/Reflex Negative Negative  Culture, Group A Strep  Result Value Ref Range   Strep A Culture Negative   Comprehensive metabolic panel  Result Value Ref Range   Glucose 85 65 - 99 mg/dL   BUN 16 8 - 27 mg/dL   Creatinine, Ser 1.17 0.76 - 1.27 mg/dL   GFR calc non Af Amer 67 >59 mL/min/1.73   GFR calc Af Amer 77 >59 mL/min/1.73   BUN/Creatinine Ratio 14 10 - 24   Sodium 147 (H) 134 - 144 mmol/L   Potassium 4.6 3.5 - 5.2 mmol/L   Chloride 103 96 - 106 mmol/L   CO2 27 18 - 29 mmol/L   Calcium 9.8 8.6 - 10.2 mg/dL  Total Protein 6.9 6.0 - 8.5 g/dL   Albumin 4.2 3.6 - 4.8 g/dL   Globulin, Total 2.7 1.5 - 4.5 g/dL   Albumin/Globulin Ratio 1.6 1.2 - 2.2   Bilirubin Total 0.4 0.0 - 1.2 mg/dL   Alkaline Phosphatase 94 39 - 117 IU/L   AST 28 0 - 40 IU/L   ALT 26 0 - 44 IU/L  Hgb A1c w/o eAG  Result Value Ref Range   Hgb A1c MFr Bld 6.3 (H) 4.8 - 5.6 %      Assessment & Plan:   Problem List Items Addressed This Visit      Unprioritized   Acid reflux    Stable, continue present medications.        Benign hypertension with chronic kidney disease - Primary   Relevant Medications   Tadalafil (CIALIS) 2.5 MG TABS   Other Relevant Orders   Comprehensive metabolic panel   Benign prostatic hyperplasia   ED (erectile dysfunction)    Start daily Cialis       Hypercholesterolemia   Relevant Medications   Tadalafil (CIALIS) 2.5 MG TABS   Other Relevant Orders   Lipid Panel w/o Chol/HDL Ratio   Hypertension    Stable, continue present medications.        Relevant Medications   Tadalafil (CIALIS) 2.5 MG TABS   IFG (impaired fasting glucose)    Stable with Hgb A1C of 6.0       Relevant Orders   Bayer DCA Hb A1c Waived   Tremor of left hand   Relevant Orders   Ambulatory referral to Neurology       Follow up plan: Return in about 6 months (around 04/24/2018) for physical.

## 2017-10-25 NOTE — Assessment & Plan Note (Signed)
Start daily Cialis

## 2017-10-25 NOTE — Assessment & Plan Note (Signed)
Stable with Hgb A1C of 6.0

## 2017-10-25 NOTE — Assessment & Plan Note (Signed)
Stable, continue present medications.   

## 2017-10-26 LAB — LIPID PANEL W/O CHOL/HDL RATIO
Cholesterol, Total: 125 mg/dL (ref 100–199)
HDL: 32 mg/dL — AB (ref >39)
LDL Calculated: 65 mg/dL (ref 0–99)
Triglycerides: 139 mg/dL (ref 0–149)
VLDL Cholesterol Cal: 28 mg/dL (ref 5–40)

## 2017-10-26 LAB — COMPREHENSIVE METABOLIC PANEL
A/G RATIO: 1.9 (ref 1.2–2.2)
ALT: 25 IU/L (ref 0–44)
AST: 25 IU/L (ref 0–40)
Albumin: 4.4 g/dL (ref 3.6–4.8)
Alkaline Phosphatase: 112 IU/L (ref 39–117)
BILIRUBIN TOTAL: 0.8 mg/dL (ref 0.0–1.2)
BUN/Creatinine Ratio: 16 (ref 10–24)
BUN: 18 mg/dL (ref 8–27)
CHLORIDE: 100 mmol/L (ref 96–106)
CO2: 26 mmol/L (ref 20–29)
Calcium: 9.4 mg/dL (ref 8.6–10.2)
Creatinine, Ser: 1.1 mg/dL (ref 0.76–1.27)
GFR calc non Af Amer: 72 mL/min/{1.73_m2} (ref >59)
GFR, EST AFRICAN AMERICAN: 83 mL/min/{1.73_m2} (ref >59)
GLOBULIN, TOTAL: 2.3 g/dL (ref 1.5–4.5)
Glucose: 112 mg/dL — ABNORMAL HIGH (ref 65–99)
POTASSIUM: 4.6 mmol/L (ref 3.5–5.2)
SODIUM: 140 mmol/L (ref 134–144)
TOTAL PROTEIN: 6.7 g/dL (ref 6.0–8.5)

## 2017-10-28 ENCOUNTER — Encounter: Payer: Self-pay | Admitting: Unknown Physician Specialty

## 2018-01-01 ENCOUNTER — Encounter: Payer: Self-pay | Admitting: Neurology

## 2018-01-14 NOTE — Progress Notes (Signed)
KALUP JAQUITH was seen today in the movement disorders clinic for neurologic consultation at the request of Kathrine Haddock, NP.  The consultation is for the evaluation of tremor on the L.   Tremor: Yes.     How long has it been going on? 1.5 years ago - started about 3 months after total shoulder replacement  At rest or with activation?  Rest mostly but some with activation - can control it for a short bit  When is it noted the most?  rest  Fam hx of tremor?  Yes.  , first cousin with PD  Located where?  Just L arm  Affected by caffeine: "it might be"  Affected by alcohol:  Doesn't drink   Affected by stress:  Yes.    Affected by fatigue:  No.  Affects ADL's (tying shoes, brushing teeth, etc):  No.   Specific Symptoms:  Voice: no change Sleep: sleeps well  Vivid Dreams:  No.  Acting out dreams:  Yes.  , occasionally sleep talk Wet Pillows: No. Postural symptoms:  No.  Falls?  No. Bradykinesia symptoms: difficulty getting out of a chair (due to knee pain) Loss of smell:  Yes.   - "lost that a long time ago" Loss of taste:  Yes.   Urinary Incontinence:  No. Difficulty Swallowing:  No. Handwriting, micrographia: Yes.   Trouble with ADL's:  No.  Trouble buttoning clothing: No. Depression:  No. Memory changes:  No. Hallucinations:  No.  visual distortions: No. N/V:  No. Lightheaded:  No.  Syncope: No. Diplopia:  No. Dyskinesia:  No.  Neuroimaging of the brain has not previously been performed.   PREVIOUS MEDICATIONS: none to date  ALLERGIES:   Allergies  Allergen Reactions  . Amoxil [Amoxicillin] Swelling  . Penicillin G Benzathine Swelling    CURRENT MEDICATIONS:  Outpatient Encounter Medications as of 01/16/2018  Medication Sig  . aspirin 81 MG tablet Take 81 mg by mouth daily.  [name] Kitchen atorvastatin (LIPITOR) 20 MG tablet take 1 tablet by mouth once daily  . escitalopram (LEXAPRO) 10 MG tablet take 1 tablet by mouth once daily  . lansoprazole (PREVACID) 30 MG  capsule take 1 capsule by mouth once daily  . meloxicam (MOBIC) 15 MG tablet Take 1 tablet (15 mg total) by mouth daily.  . metoprolol succinate (TOPROL-XL) 100 MG 24 hr tablet Take 1 tablet (100 mg total) by mouth daily. Take with or immediately following a meal.  . Tadalafil (CIALIS) 2.5 MG TABS Take 1 tablet (2.5 mg total) daily by mouth.   No facility-administered encounter medications on file as of 01/16/2018.     PAST MEDICAL HISTORY:   Past Medical History:  Diagnosis Date  . Anxiety   . BPH (benign prostatic hypertrophy)   . Cancer ([place])    basal cell  . Chronic kidney disease   . Hyperlipidemia   . Hypertension   . Hypogonadism in male   . Osteoarthritis   . Prediabetes   . SVT (supraventricular tachycardia) ([place])     PAST SURGICAL HISTORY:   Past Surgical History:  Procedure Laterality Date  . BASAL CELL CARCINOMA EXCISION    . cardio oblation    . CHOLECYSTECTOMY    . COLONOSCOPY WITH PROPOFOL N/A 04/12/2017   Procedure: COLONOSCOPY WITH PROPOFOL;  Surgeon: [name], MD;  Location: [place];  Service: Endoscopy;  Laterality: N/A;  . JOINT REPLACEMENT Left    shoulder  . POLYPECTOMY N/A 04/12/2017   Procedure: POLYPECTOMY;  Surgeon: [name], MD;  Location: [place];  Service: Endoscopy;  Laterality: N/A;    SOCIAL HISTORY:   Social History   Socioeconomic History  . Marital status: Married    Spouse name: Not on file  . Number of children: Not on file  . Years of education: Not on file  . Highest education level: Not on file  Social Needs  . Financial resource strain: Not on file  . Food insecurity - worry: Not on file  . Food insecurity - inability: Not on file  . Transportation needs - medical: Not on file  . Transportation needs - non-medical: Not on file  Occupational History  . Not on file  Tobacco Use  . Smoking status: Never Smoker  . Smokeless tobacco: Never Used  Substance and Sexual Activity  . Alcohol use: No     Alcohol/week: 0.0 oz  . Drug use: No  . Sexual activity: Yes  Other Topics Concern  . Not on file  Social History Narrative  . Not on file    FAMILY HISTORY:   Family Status  Relation Name Status  . Mother  Deceased at age 39  . Father  Deceased at age 52  . Sister  Alive  . PGM  Deceased  . Sister  Alive  . Daughter  Alive  . MGM  Deceased  . MGF  Deceased  . PGF  Deceased  . Sister  Alive  . Daughter  Alive  . Daughter  Alive    ROS:  A complete 10 system review of systems was obtained and was unremarkable apart from what is mentioned above.  PHYSICAL EXAMINATION:    VITALS:   Vitals:   01/16/18 0937  BP: 140/78  Pulse: 78  SpO2: 94%  Weight: 262 lb (118.8 kg)  Height: 5\' 11"  (1.803 m)    GEN:  The patient appears stated age and is in NAD. HEENT:  Normocephalic, atraumatic.  The mucous membranes are moist. The superficial temporal arteries are without ropiness or tenderness. CV:  RRR Lungs:  CTAB Neck/HEME:  There are no carotid bruits bilaterally.  Neurological examination:  Orientation: The patient is alert and oriented x3. Fund of knowledge is appropriate.  Recent and remote memory are intact.  Attention and concentration are normal.    Able to name objects and repeat phrases. Cranial nerves: There is good facial symmetry. Pupils are equal round and reactive to light bilaterally. Fundoscopic exam reveals clear margins bilaterally. Extraocular muscles are intact. The visual fields are full to confrontational testing. The speech is fluent and clear. Soft palate rises symmetrically and there is no tongue deviation. Hearing is intact to conversational tone. Sensation: Sensation is intact to light and pinprick throughout (facial, trunk, extremities). Vibration is intact at the bilateral big toe. There is no extinction with double simultaneous stimulation. There is no sensory dermatomal level identified. Motor: Strength is 5/5 in the bilateral upper and lower  extremities.   Shoulder shrug is equal and symmetric.  There is no pronator drift. Deep tendon reflexes: Deep tendon reflexes are 0-1/4 at the bilateral biceps, triceps, brachioradialis, patella and achilles. Plantar responses are downgoing bilaterally.  Movement examination: Tone: There is mild increased tone in the LUE extremity that increases with distraction.   Abnormal movements: There is LUE resting tremor that increases with distraction Coordination:  There is decremation with RAM's, with any form of RAMS, including alternating supination and pronation of the forearm, hand opening and closing, finger taps on  the L.  All other RAMs are good on the L and all RAMs on the right are good. Gait and Station: The patient has no difficulty arising out of a deep-seated chair without the use of the hands. The patient's stride length is normal.  The patient has a negative pull test.      Labs:    Chemistry      Component Value Date/Time   NA 140 10/25/2017 0944   NA 142 09/02/2013 0220   K 4.6 10/25/2017 0944   K 4.2 09/02/2013 0220   CL 100 10/25/2017 0944   CL 107 09/02/2013 0220   CO2 26 10/25/2017 0944   CO2 25 09/02/2013 0220   BUN 18 10/25/2017 0944   BUN 23 (H) 09/02/2013 0220   CREATININE 1.10 10/25/2017 0944   CREATININE 1.52 (H) 09/02/2013 0220      Component Value Date/Time   CALCIUM 9.4 10/25/2017 0944   CALCIUM 9.4 09/02/2013 0220   ALKPHOS 112 10/25/2017 0944   ALKPHOS 107 09/02/2013 0220   AST 25 10/25/2017 0944   AST 30 09/02/2013 0220   ALT 25 10/25/2017 0944   ALT 38 09/02/2013 0220   BILITOT 0.8 10/25/2017 0944   BILITOT 0.3 09/02/2013 0220     Lab Results  Component Value Date   TSH 2.030 03/27/2017     ASSESSMENT/PLAN:  1.  Parkinsonism.  I suspect that this does represent idiopathic Parkinson's disease.  The patient has tremor, bradykinesia, rigidity and mild postural instability. Dx on 01/16/18  -We discussed the diagnosis as well as pathophysiology  of the disease.  We discussed treatment options as well as prognostic indicators.  Patient education was provided.  -We discussed that it used to be thought that levodopa would increase risk of melanoma but now it is believed that Parkinsons itself likely increases risk of melanoma. he is to get regular skin checks.  -Greater than 50% of the 60 minute visit was spent in counseling answering questions and talking about what to expect now as well as in the future.  We talked about medication options as well as potential future surgical options.  We talked about safety in the home.  -Talked about medication options and goals with medications.  Ultimately decided to start pramipexole ER and worked 1.5 mg daily.  Discussed extensively risks, benefits, and side effects including but not limited to compulsive behaviors and sleep attacks.  Patient expressed understanding.  -We discussed community resources in the area including patient support groups and community exercise programs for PD and pt education was provided to the patient.  -Decided to hold off on any neuro imaging given exam nonfocal and nonlateralizing, with the exception of parkinsonian features.  2.  Follow up is anticipated in the next 3 months, sooner should new neurologic issues arise.     Cc:  Kathrine Haddock, NP

## 2018-01-16 ENCOUNTER — Ambulatory Visit: Payer: BLUE CROSS/BLUE SHIELD | Admitting: Neurology

## 2018-01-16 ENCOUNTER — Encounter: Payer: Self-pay | Admitting: Neurology

## 2018-01-16 VITALS — BP 140/78 | HR 78 | Ht 71.0 in | Wt 262.0 lb

## 2018-01-16 DIAGNOSIS — G20A1 Parkinson's disease without dyskinesia, without mention of fluctuations: Secondary | ICD-10-CM | POA: Insufficient documentation

## 2018-01-16 DIAGNOSIS — G20 Parkinson's disease: Secondary | ICD-10-CM | POA: Diagnosis not present

## 2018-01-16 MED ORDER — PRAMIPEXOLE DIHYDROCHLORIDE ER 1.5 MG PO TB24: 1.0000 | ORAL_TABLET | Freq: Every day | ORAL | 1 refills | Status: DC

## 2018-01-16 MED ORDER — PRAMIPEXOLE DIHYDROCHLORIDE 0.125 MG PO TABS: ORAL_TABLET | ORAL | 0 refills | Status: DC

## 2018-01-16 NOTE — Patient Instructions (Signed)
1. Start mirapex (pramipexole) as follows:  0.125 mg - 1 tablet three times per day for a week, then 2 tablets three times per day for a week and then fill the 1.5 mg ER tablet and take that, 1 pill daily.

## 2018-01-29 ENCOUNTER — Encounter: Payer: Self-pay | Admitting: Unknown Physician Specialty

## 2018-01-29 ENCOUNTER — Telehealth: Payer: Self-pay | Admitting: Neurology

## 2018-01-29 NOTE — Telephone Encounter (Signed)
Pt called and said the insurance company will not pay for the one pill a day so pt wants a prescription for Miripex called in

## 2018-01-30 MED ORDER — PRAMIPEXOLE DIHYDROCHLORIDE 0.5 MG PO TABS: 0.5000 mg | ORAL_TABLET | Freq: Three times a day (TID) | ORAL | 1 refills | Status: DC

## 2018-01-30 NOTE — Telephone Encounter (Signed)
I had not received denial for Mirapex 1.5 mg - QD, but per patient request sent Mirapex 0.5 mg TID to the patient's pharmacy.

## 2018-04-01 ENCOUNTER — Other Ambulatory Visit: Payer: Self-pay

## 2018-04-01 MED ORDER — LANSOPRAZOLE 30 MG PO CPDR: 30.0000 mg | DELAYED_RELEASE_CAPSULE | Freq: Every day | ORAL | 3 refills | Status: DC

## 2018-04-01 NOTE — Telephone Encounter (Signed)
Patient last seen 10/25/17 and has f/up 05/02/18.

## 2018-04-18 NOTE — Progress Notes (Signed)
Brian Frederick was seen today in the movement disorders clinic for neurologic consultation at the request of Kathrine Haddock, NP.  The consultation is for the evaluation of tremor on the L.   04/22/09 update: Patient is seen today in follow-up.  Patient was diagnosed with Parkinson's disease last visit and started on pramipexole and work to 0.5 mg 3 times per day.  It slowed tremor down some but its not gone.  Patient denies compulsive behaviors.  Denies sleep attacks.  Pt denies falls.  Pt denies lightheadedness, near syncope.  No hallucinations.  Mood has been good.  He has gained weight but reports he retired at the beginning of the year and hasn't been eating or exercising like he should.  Does have a stationary bike.  PREVIOUS MEDICATIONS: none to date  ALLERGIES:   Allergies  Allergen Reactions  . Amoxil [Amoxicillin] Swelling  . Penicillin G Benzathine Swelling    CURRENT MEDICATIONS:  Outpatient Encounter Medications as of 04/22/2018  Medication Sig  . aspirin 81 MG tablet Take 81 mg by mouth daily.  [name] Kitchen atorvastatin (LIPITOR) 20 MG tablet take 1 tablet by mouth once daily  . escitalopram (LEXAPRO) 10 MG tablet take 1 tablet by mouth once daily  . lansoprazole (PREVACID) 30 MG capsule Take 1 capsule (30 mg total) by mouth daily.  . meloxicam (MOBIC) 15 MG tablet Take 1 tablet (15 mg total) by mouth daily.  . metoprolol succinate (TOPROL-XL) 100 MG 24 hr tablet Take 1 tablet (100 mg total) by mouth daily. Take with or immediately following a meal.  . pramipexole (MIRAPEX) 0.5 MG tablet Take 1 tablet (0.5 mg total) by mouth 3 (three) times daily.  . Tadalafil (CIALIS) 2.5 MG TABS Take 1 tablet (2.5 mg total) daily by mouth.   No facility-administered encounter medications on file as of 04/22/2018.     PAST MEDICAL HISTORY:   Past Medical History:  Diagnosis Date  . Anxiety   . BPH (benign prostatic hypertrophy)   . Cancer ([place])    basal cell  . Chronic kidney disease   .  Hyperlipidemia   . Hypertension   . Hypogonadism in male   . Osteoarthritis   . Prediabetes   . SVT (supraventricular tachycardia) ([place])     PAST SURGICAL HISTORY:   Past Surgical History:  Procedure Laterality Date  . BASAL CELL CARCINOMA EXCISION    . cardio oblation    . CHOLECYSTECTOMY    . COLONOSCOPY WITH PROPOFOL N/A 04/12/2017   Procedure: COLONOSCOPY WITH PROPOFOL;  Surgeon: [name], MD;  Location: [facility];  Service: Endoscopy;  Laterality: N/A;  . JOINT REPLACEMENT Left    shoulder  . POLYPECTOMY N/A 04/12/2017   Procedure: POLYPECTOMY;  Surgeon: [name], MD;  Location: [facility];  Service: Endoscopy;  Laterality: N/A;    SOCIAL HISTORY:   Social History   Socioeconomic History  . Marital status: Married    Spouse name: Not on file  . Number of children: Not on file  . Years of education: Not on file  . Highest education level: Not on file  Occupational History  . Occupation: retired    Comment: Manufacturing systems engineer  . Financial resource strain: Not on file  . Food insecurity:    Worry: Not on file    Inability: Not on file  . Transportation needs:    Medical: Not on file    Non-medical: Not on file  Tobacco Use  .  Smoking status: Never Smoker  . Smokeless tobacco: Never Used  Substance and Sexual Activity  . Alcohol use: No    Alcohol/week: 0.0 oz  . Drug use: No  . Sexual activity: Yes  Lifestyle  . Physical activity:    Days per week: Not on file    Minutes per session: Not on file  . Stress: Not on file  Relationships  . Social connections:    Talks on phone: Not on file    Gets together: Not on file    Attends religious service: Not on file    Active member of club or organization: Not on file    Attends meetings of clubs or organizations: Not on file    Relationship status: Not on file  . Intimate partner violence:    Fear of current or ex partner: Not on file    Emotionally abused: Not on file     Physically abused: Not on file    Forced sexual activity: Not on file  Other Topics Concern  . Not on file  Social History Narrative  . Not on file    FAMILY HISTORY:   Family Status  Relation Name Status  . Mother  Deceased at age 75  . Father  Deceased at age 56  . Sister  Alive  . PGM  Deceased  . Sister  Alive  . Daughter  Alive  . MGM  Deceased  . MGF  Deceased  . PGF  Deceased  . Sister  Alive  . Daughter  Alive  . Daughter  Alive    ROS:  Review of Systems  Constitutional: Negative.   HENT: Negative.   Eyes: Negative.   Respiratory: Negative.   Cardiovascular: Negative.   Gastrointestinal: Negative.   Genitourinary: Negative.   Musculoskeletal: Positive for myalgias (upper legs bilaterally).  Skin: Negative.   Neurological: Positive for tremors.  Endo/Heme/Allergies: Negative.   Psychiatric/Behavioral: Negative.      PHYSICAL EXAMINATION:    VITALS:   Vitals:   04/22/18 1106  BP: 124/84  Pulse: 70  SpO2: 97%  Weight: 272 lb 4 oz (123.5 kg)  Height: 5\' 11"  (1.803 m)   Wt Readings from Last 3 Encounters:  04/22/18 272 lb 4 oz (123.5 kg)  01/16/18 262 lb (118.8 kg)  10/25/17 257 lb 12.8 oz (116.9 kg)     GEN:  The patient appears stated age and is in NAD. HEENT:  Normocephalic, atraumatic.  The mucous membranes are moist. The superficial temporal arteries are without ropiness or tenderness. CV:  RRR Lungs:  CTAB Neck/HEME:  There are no carotid bruits bilaterally.  Neurological examination:  Orientation: The patient is alert and oriented x3. Cranial nerves: There is good facial symmetry. The speech is fluent and clear. Soft palate rises symmetrically and there is no tongue deviation. Hearing is intact to conversational tone. Sensation: Sensation is intact to light touch throughout Motor: Strength is 5/5 in the bilateral upper and lower extremities.   Shoulder shrug is equal and symmetric.  There is no pronator drift.  Movement  examination: Tone: There is mild increased tone in the LUE extremity that increases with distraction.   Abnormal movements: There is LUE resting tremor that increases with distraction (same as previous) Coordination:  There is mild trouble with hand opening/closing on the L but otherwise RAMs are good. Gait and Station: The patient has no difficulty arising out of a deep-seated chair without the use of the hands. The patient's stride length is normal.  Arm swing is good.  The patient has a negative pull test.      Labs:    Chemistry      Component Value Date/Time   NA 140 10/25/2017 0944   NA 142 09/02/2013 0220   K 4.6 10/25/2017 0944   K 4.2 09/02/2013 0220   CL 100 10/25/2017 0944   CL 107 09/02/2013 0220   CO2 26 10/25/2017 0944   CO2 25 09/02/2013 0220   BUN 18 10/25/2017 0944   BUN 23 (H) 09/02/2013 0220   CREATININE 1.10 10/25/2017 0944   CREATININE 1.52 (H) 09/02/2013 0220      Component Value Date/Time   CALCIUM 9.4 10/25/2017 0944   CALCIUM 9.4 09/02/2013 0220   ALKPHOS 112 10/25/2017 0944   ALKPHOS 107 09/02/2013 0220   AST 25 10/25/2017 0944   AST 30 09/02/2013 0220   ALT 25 10/25/2017 0944   ALT 38 09/02/2013 0220   BILITOT 0.8 10/25/2017 0944   BILITOT 0.3 09/02/2013 0220     Lab Results  Component Value Date   TSH 2.030 03/27/2017     ASSESSMENT/PLAN:  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  We discussed treatment options as well as prognostic indicators.  Patient education was provided.  -We discussed that it used to be thought that levodopa would increase risk of melanoma but now it is believed that Parkinsons itself likely increases risk of melanoma. he is to get regular skin checks.  His last visit was November.  Told him to update dermatologist on this dx  -increase pramipexole slowly to 1 mg tid.  Risks, benefits, side effects and alternative therapies were discussed.  The  opportunity to ask questions was given and they were answered to the best of my ability.  The patient expressed understanding and willingness to follow the outlined treatment protocols.  -talked about importance of exercise!  Also proper/mindful diet as he has gained weight since retiring.    -invited to PD symposium.  Pt education given  2.  Follow up is anticipated in the next few months, sooner should new neurologic issues arise.  Much greater than 50% of this visit was spent in counseling and coordinating care.  Total face to face time:  25 min   Cc:  Kathrine Haddock, NP

## 2018-04-22 ENCOUNTER — Ambulatory Visit (INDEPENDENT_AMBULATORY_CARE_PROVIDER_SITE_OTHER): Payer: BLUE CROSS/BLUE SHIELD | Admitting: Neurology

## 2018-04-22 ENCOUNTER — Encounter: Payer: Self-pay | Admitting: Neurology

## 2018-04-22 VITALS — BP 124/84 | HR 70 | Ht 71.0 in | Wt 272.2 lb

## 2018-04-22 DIAGNOSIS — G20 Parkinson's disease: Secondary | ICD-10-CM | POA: Diagnosis not present

## 2018-04-22 DIAGNOSIS — R635 Abnormal weight gain: Secondary | ICD-10-CM

## 2018-04-22 MED ORDER — PRAMIPEXOLE DIHYDROCHLORIDE 1 MG PO TABS: 1.0000 mg | ORAL_TABLET | Freq: Three times a day (TID) | ORAL | 1 refills | Status: DC

## 2018-04-22 NOTE — Patient Instructions (Signed)
Increase pramipexole 0.5 mg - 2 in the AM, 1 in afternoon, 1 in the evening for a week and then take 2 in the AM, 2 in the afternoon, 1 in the evening for a week, then get your new RX and take pramipexole 1 mg three times per day.  Exercise!   [facility] [employer] & Movement Disorders  The [facility] Movement Disorders team know that living well with a movement disorder extends far beyond our clinic walls. We are together with you. Our team is passionate about providing resources to you and your loved ones who are living with Parkinson's disease and movement disorders. Participate in these programs and join our community. These resources are free or low cost!   [facility] is adding:   Innovative educational programs for patients and caregivers.   Support groups for patients and caregivers living with Parkinson's disease.   Parkinson's specific exercise programs.   Custom tailored therapeutic programs that will benefit patient's living with Parkinson's disease.   We are in this together. You can help and contribute to grow these programs and resources in our community. 100% of the funds donated to the [facility] stays right here in our community to support patients and their caregivers.  To make a tax deductible contribution:  -ask for a [facility] for Parkinson's envelope in the office today.  - call the Office of Institutional Advancement at [phone].     Registration is OPEN!    Third Annual Parkinson's Education Symposium   To register: [email or web address]      Search:  [employer] person attending individually Questions: [place], [place] or [email or web address]

## 2018-04-25 ENCOUNTER — Encounter: Payer: BLUE CROSS/BLUE SHIELD | Admitting: Unknown Physician Specialty

## 2018-05-02 ENCOUNTER — Ambulatory Visit (INDEPENDENT_AMBULATORY_CARE_PROVIDER_SITE_OTHER): Payer: BLUE CROSS/BLUE SHIELD | Admitting: Unknown Physician Specialty

## 2018-05-02 ENCOUNTER — Encounter: Payer: Self-pay | Admitting: Unknown Physician Specialty

## 2018-05-02 VITALS — BP 141/96 | HR 63 | Temp 98.4°F | Ht 71.0 in | Wt 271.4 lb

## 2018-05-02 DIAGNOSIS — F419 Anxiety disorder, unspecified: Secondary | ICD-10-CM | POA: Diagnosis not present

## 2018-05-02 DIAGNOSIS — Z0000 Encounter for general adult medical examination without abnormal findings: Secondary | ICD-10-CM

## 2018-05-02 DIAGNOSIS — Z9889 Other specified postprocedural states: Secondary | ICD-10-CM | POA: Insufficient documentation

## 2018-05-02 DIAGNOSIS — G20 Parkinson's disease: Secondary | ICD-10-CM | POA: Diagnosis not present

## 2018-05-02 DIAGNOSIS — Z0001 Encounter for general adult medical examination with abnormal findings: Secondary | ICD-10-CM | POA: Diagnosis not present

## 2018-05-02 DIAGNOSIS — E7800 Pure hypercholesterolemia, unspecified: Secondary | ICD-10-CM | POA: Diagnosis not present

## 2018-05-02 DIAGNOSIS — R7301 Impaired fasting glucose: Secondary | ICD-10-CM | POA: Diagnosis not present

## 2018-05-02 DIAGNOSIS — I10 Essential (primary) hypertension: Secondary | ICD-10-CM

## 2018-05-02 NOTE — Progress Notes (Signed)
BP (!) 141/96 (BP Location: Left Arm, Cuff Size: Large)   Pulse 63   Temp 98.4 F (36.9 C) (Oral)   Ht 5\' 11"  (1.803 m)   Wt 271 lb 6.4 oz (123.1 kg)   SpO2 98%   BMI 37.85 kg/m    Subjective:    Patient ID: Brian Frederick, male    DOB: [birth year], 62 y.o.   MRN: [id number]  HPI: Brian Frederick is a 62 y.o. male  Chief Complaint  Patient presents with  . Annual Exam   Parkinson's New diagnosis made by neurology.  Note reviewed.  Taking Sinemet with recent increases  Hypertension Using medications without difficulty Average home BPs 124/84 at neurology   No problems or lightheadedness No chest pain with exertion or shortness of breath No Edema  Hyperlipidemia Using medications without problems: No Muscle aches  Diet compliance:Exercise: Retired and doing a little less.  Working on getting back to that  Depression Doing well Depression screen [facility] 2/9 05/02/2018 10/25/2017 03/27/2017 09/25/2016 03/23/2016  Decreased Interest 0 0 0 0 0  Down, Depressed, Hopeless 0 0 0 1 0  PHQ - 2 Score 0 0 0 1 0  Altered sleeping 0 1 0 - -  Tired, decreased energy 1 1 1  - -  Change in appetite 0 0 0 - -  Feeling bad or failure about yourself  0 0 0 - -  Trouble concentrating 0 0 0 - -  Moving slowly or fidgety/restless 0 0 0 - -  Suicidal thoughts 0 0 0 - -  PHQ-9 Score 1 2 1  - -    Relevant past medical, surgical, family and social history reviewed and updated as indicated. Interim medical history since our last visit reviewed. Allergies and medications reviewed and updated.  Review of Systems  Constitutional: Positive for activity change.  HENT: Negative.   Eyes: Negative.   Respiratory: Negative.   Cardiovascular: Negative.   Gastrointestinal: Negative.   Endocrine: Negative.   Genitourinary: Negative.   Musculoskeletal:       Tremors right arm  Skin: Negative.   Allergic/Immunologic: Negative.   Neurological: Negative.   Hematological: Negative.     Psychiatric/Behavioral: Negative.     Per HPI unless specifically indicated above     Objective:    BP (!) 141/96 (BP Location: Left Arm, Cuff Size: Large)   Pulse 63   Temp 98.4 F (36.9 C) (Oral)   Ht 5\' 11"  (1.803 m)   Wt 271 lb 6.4 oz (123.1 kg)   SpO2 98%   BMI 37.85 kg/m   Wt Readings from Last 3 Encounters:  05/02/18 271 lb 6.4 oz (123.1 kg)  04/22/18 272 lb 4 oz (123.5 kg)  01/16/18 262 lb (118.8 kg)    Physical Exam  Constitutional: He is oriented to person, place, and time. He appears well-developed and well-nourished.  HENT:  Head: Normocephalic.  Right Ear: Tympanic membrane, external ear and ear canal normal.  Left Ear: Tympanic membrane, external ear and ear canal normal.  Mouth/Throat: Uvula is midline, oropharynx is clear and moist and mucous membranes are normal.  Eyes: Pupils are equal, round, and reactive to light.  Cardiovascular: Normal rate, regular rhythm and normal heart sounds. Exam reveals no gallop and no friction rub.  No murmur heard. Pulmonary/Chest: Effort normal and breath sounds normal. No respiratory distress.  Abdominal: Soft. Bowel sounds are normal. He exhibits no distension. There is no tenderness.  Genitourinary:  Genitourinary Comments: Refusing rectal exam  Musculoskeletal: Normal range of motion.  Neurological: He is alert and oriented to person, place, and time. He has normal reflexes.  Skin: Skin is warm and dry.  Psychiatric: He has a normal mood and affect. His behavior is normal. Judgment and thought content normal.    Results for orders placed or performed in visit on 10/25/17  Bayer DCA Hb A1c Waived  Result Value Ref Range   Bayer DCA Hb A1c Waived 6.0 <7.0 %  Comprehensive metabolic panel  Result Value Ref Range   Glucose 112 (H) 65 - 99 mg/dL   BUN 18 8 - 27 mg/dL   Creatinine, Ser 1.10 0.76 - 1.27 mg/dL   GFR calc non Af Amer 72 >59 mL/min/1.73   GFR calc Af Amer 83 >59 mL/min/1.73   BUN/Creatinine Ratio 16 10  - 24   Sodium 140 134 - 144 mmol/L   Potassium 4.6 3.5 - 5.2 mmol/L   Chloride 100 96 - 106 mmol/L   CO2 26 20 - 29 mmol/L   Calcium 9.4 8.6 - 10.2 mg/dL   Total Protein 6.7 6.0 - 8.5 g/dL   Albumin 4.4 3.6 - 4.8 g/dL   Globulin, Total 2.3 1.5 - 4.5 g/dL   Albumin/Globulin Ratio 1.9 1.2 - 2.2   Bilirubin Total 0.8 0.0 - 1.2 mg/dL   Alkaline Phosphatase 112 39 - 117 IU/L   AST 25 0 - 40 IU/L   ALT 25 0 - 44 IU/L  Lipid Panel w/o Chol/HDL Ratio  Result Value Ref Range   Cholesterol, Total 125 100 - 199 mg/dL   Triglycerides 139 0 - 149 mg/dL   HDL 32 (L) >39 mg/dL   VLDL Cholesterol Cal 28 5 - 40 mg/dL   LDL Calculated 65 0 - 99 mg/dL      Assessment & Plan:   Problem List Items Addressed This Visit      Unprioritized   Chronic anxiety    Stable, continue present medications.        Hypercholesterolemia    On Atorvastatin.  Check lipid panel for optimal control      Hypertension    High today but good outside the office.  Stable, continue present medications.        Relevant Orders   Comprehensive metabolic panel   IFG (impaired fasting glucose)    Recent weight gain.  Check Hgb AiC      Relevant Orders   Hgb A1c w/o eAG   Parkinson's disease ([place])    Follow per neurology      Relevant Orders   Lipid Panel w/o Chol/HDL Ratio    Other Visit Diagnoses    Annual physical exam    -  Primary   Relevant Orders   CBC with Differential/Platelet   TSH   PSA       Follow up plan: Return in about 6 months (around 11/02/2018).

## 2018-05-02 NOTE — Assessment & Plan Note (Addendum)
High today but good outside the office.  Stable, continue present medications.

## 2018-05-02 NOTE — Assessment & Plan Note (Signed)
On Atorvastatin.  Check lipid panel for optimal control

## 2018-05-02 NOTE — Assessment & Plan Note (Signed)
Follow per neurology

## 2018-05-02 NOTE — Patient Instructions (Signed)

## 2018-05-02 NOTE — Assessment & Plan Note (Signed)
Stable, continue present medications.   

## 2018-05-02 NOTE — Assessment & Plan Note (Signed)
Recent weight gain.  Check Hgb AiC

## 2018-05-03 LAB — TSH: TSH: 2.67 u[IU]/mL (ref 0.450–4.500)

## 2018-05-03 LAB — CBC WITH DIFFERENTIAL/PLATELET
Basophils Absolute: 0 10*3/uL (ref 0.0–0.2)
Basos: 1 %
EOS (ABSOLUTE): 0.2 10*3/uL (ref 0.0–0.4)
EOS: 4 %
Hematocrit: 42.1 % (ref 37.5–51.0)
Hemoglobin: 14.3 g/dL (ref 13.0–17.7)
Immature Grans (Abs): 0.1 10*3/uL (ref 0.0–0.1)
Immature Granulocytes: 1 %
LYMPHS ABS: 1.8 10*3/uL (ref 0.7–3.1)
Lymphs: 30 %
MCH: 29.2 pg (ref 26.6–33.0)
MCHC: 34 g/dL (ref 31.5–35.7)
MCV: 86 fL (ref 79–97)
MONOS ABS: 0.5 10*3/uL (ref 0.1–0.9)
Monocytes: 8 %
NEUTROS ABS: 3.4 10*3/uL (ref 1.4–7.0)
Neutrophils: 56 %
PLATELETS: 179 10*3/uL (ref 150–379)
RBC: 4.89 x10E6/uL (ref 4.14–5.80)
RDW: 14.6 % (ref 12.3–15.4)
WBC: 6 10*3/uL (ref 3.4–10.8)

## 2018-05-03 LAB — COMPREHENSIVE METABOLIC PANEL
A/G RATIO: 2.3 — AB (ref 1.2–2.2)
ALT: 28 IU/L (ref 0–44)
AST: 25 IU/L (ref 0–40)
Albumin: 4.3 g/dL (ref 3.6–4.8)
Alkaline Phosphatase: 99 IU/L (ref 39–117)
BILIRUBIN TOTAL: 0.4 mg/dL (ref 0.0–1.2)
BUN/Creatinine Ratio: 13 (ref 10–24)
BUN: 15 mg/dL (ref 8–27)
CHLORIDE: 100 mmol/L (ref 96–106)
CO2: 23 mmol/L (ref 20–29)
Calcium: 9.2 mg/dL (ref 8.6–10.2)
Creatinine, Ser: 1.19 mg/dL (ref 0.76–1.27)
GFR calc non Af Amer: 65 mL/min/{1.73_m2} (ref >59)
GFR, EST AFRICAN AMERICAN: 75 mL/min/{1.73_m2} (ref >59)
Globulin, Total: 1.9 g/dL (ref 1.5–4.5)
Glucose: 118 mg/dL — ABNORMAL HIGH (ref 65–99)
POTASSIUM: 4.1 mmol/L (ref 3.5–5.2)
Sodium: 143 mmol/L (ref 134–144)
Total Protein: 6.2 g/dL (ref 6.0–8.5)

## 2018-05-03 LAB — HGB A1C W/O EAG: Hgb A1c MFr Bld: 6.4 % — ABNORMAL HIGH (ref 4.8–5.6)

## 2018-05-03 LAB — LIPID PANEL W/O CHOL/HDL RATIO
CHOLESTEROL TOTAL: 140 mg/dL (ref 100–199)
HDL: 35 mg/dL — AB (ref >39)
LDL Calculated: 81 mg/dL (ref 0–99)
TRIGLYCERIDES: 120 mg/dL (ref 0–149)
VLDL Cholesterol Cal: 24 mg/dL (ref 5–40)

## 2018-05-03 LAB — PSA: PROSTATE SPECIFIC AG, SERUM: 0.2 ng/mL (ref 0.0–4.0)

## 2018-05-05 ENCOUNTER — Encounter: Payer: Self-pay | Admitting: Unknown Physician Specialty

## 2018-06-02 ENCOUNTER — Other Ambulatory Visit: Payer: Self-pay

## 2018-06-02 MED ORDER — ESCITALOPRAM OXALATE 10 MG PO TABS: 10.0000 mg | ORAL_TABLET | Freq: Every day | ORAL | 1 refills | Status: DC

## 2018-06-02 NOTE — Telephone Encounter (Signed)
Patient last seen 05/02/18 and has appointment 11/03/18.  

## 2018-06-23 ENCOUNTER — Other Ambulatory Visit: Payer: Self-pay

## 2018-06-23 MED ORDER — METOPROLOL SUCCINATE ER 100 MG PO TB24: 100.0000 mg | ORAL_TABLET | Freq: Every day | ORAL | 3 refills | Status: DC

## 2018-06-23 NOTE — Telephone Encounter (Signed)
Patient last seen 05/02/18 and has appointment 11/03/18.

## 2018-08-04 ENCOUNTER — Other Ambulatory Visit: Payer: Self-pay | Admitting: Neurology

## 2018-08-04 MED ORDER — PRAMIPEXOLE DIHYDROCHLORIDE 1 MG PO TABS: 1.0000 mg | ORAL_TABLET | Freq: Three times a day (TID) | ORAL | 1 refills | Status: DC

## 2018-08-11 ENCOUNTER — Encounter: Payer: Self-pay | Admitting: Family Medicine

## 2018-08-11 ENCOUNTER — Ambulatory Visit: Payer: BLUE CROSS/BLUE SHIELD | Admitting: Family Medicine

## 2018-08-11 ENCOUNTER — Telehealth: Payer: Self-pay | Admitting: Family Medicine

## 2018-08-11 ENCOUNTER — Other Ambulatory Visit: Payer: Self-pay

## 2018-08-11 VITALS — BP 140/92 | HR 93 | Temp 98.4°F | Ht 71.0 in | Wt 264.0 lb

## 2018-08-11 DIAGNOSIS — B370 Candidal stomatitis: Secondary | ICD-10-CM

## 2018-08-11 DIAGNOSIS — R509 Fever, unspecified: Secondary | ICD-10-CM | POA: Diagnosis not present

## 2018-08-11 DIAGNOSIS — J029 Acute pharyngitis, unspecified: Secondary | ICD-10-CM | POA: Diagnosis not present

## 2018-08-11 MED ORDER — FLUCONAZOLE 150 MG PO TABS: 150.0000 mg | ORAL_TABLET | Freq: Once | ORAL | 0 refills | Status: AC

## 2018-08-11 MED ORDER — MAGIC MOUTHWASH W/LIDOCAINE: 5.0000 mL | Freq: Three times a day (TID) | ORAL | 1 refills | Status: DC | PRN

## 2018-08-11 NOTE — Telephone Encounter (Signed)
[facility] pharmacist needs a call back with the ingredients for the magic mouthwash.   Addendum---per Apolonio Schneiders ingredients nystatin,benadryl, and lidocaine  Called pharmacist to give hiim

## 2018-08-11 NOTE — Progress Notes (Signed)
BP (!) 140/92   Pulse 93   Temp 98.4 F (36.9 C) (Oral)   Ht 5\' 11"  (1.803 m)   Wt 264 lb (119.7 kg)   SpO2 94%   BMI 36.82 kg/m    Subjective:    Patient ID: Brian Frederick, male    DOB: 04-29-1956, 62 y.o.   MRN: [id number]  HPI: Brian Frederick is a 62 y.o. male  Chief Complaint  Patient presents with  . Sore Throat    x 1 week/pt states unable to taste food and feels like he has a sore on the roof of mouth, states tongue sweling. have taken OTC tylenol and sinus medication but did not help  . Headache   Worsening sore, swollen throat, headache, low grade fevers, sore tongue, mouth ulcer x 1 week. Denies Cp, SOB, N/V/D. Taking tylenol and OTC cold medications with no relief. Multiple sick contacts recently. Nonsmoker, no hx of pulmonary dz.    Relevant past medical, surgical, family and social history reviewed and updated as indicated. Interim medical history since our last visit reviewed. Allergies and medications reviewed and updated.  Review of Systems  Per HPI unless specifically indicated above     Objective:    BP (!) 140/92   Pulse 93   Temp 98.4 F (36.9 C) (Oral)   Ht 5\' 11"  (1.803 m)   Wt 264 lb (119.7 kg)   SpO2 94%   BMI 36.82 kg/m   Wt Readings from Last 3 Encounters:  08/11/18 264 lb (119.7 kg)  05/02/18 271 lb 6.4 oz (123.1 kg)  04/22/18 272 lb 4 oz (123.5 kg)    Physical Exam  Constitutional: He is oriented to person, place, and time. He appears well-developed and well-nourished. No distress.  HENT:  Head: Atraumatic.  Tongue with white coating over a portion of it Oropharynx erythematous with b/l tonsillar edema.  0.5 cm oval ulceration on hard palate to the right  Eyes: Conjunctivae and EOM are normal.  Neck: Normal range of motion. Neck supple.  Cardiovascular: Normal rate and regular rhythm.  Pulmonary/Chest: Effort normal and breath sounds normal. No respiratory distress. He has no wheezes. He has no rales.  Musculoskeletal: Normal  range of motion.  Lymphadenopathy:    He has no cervical adenopathy.  Neurological: He is alert and oriented to person, place, and time.  Skin: Skin is warm and dry. No rash noted.  Psychiatric: He has a normal mood and affect. His behavior is normal.  Nursing note and vitals reviewed.   Results for orders placed or performed in visit on 08/11/18  Rapid Strep screen([facility])  Result Value Ref Range   Strep Gp A Ag, IA W/Reflex Negative Negative  Culture, Group A Strep  Result Value Ref Range   Strep A Culture Negative       Assessment & Plan:   Problem List Items Addressed This Visit    None    Visit Diagnoses    Pharyngitis, unspecified etiology    -  Primary   Rapid strep neg, possibly just due to the thrush but if persisting will start abx as well. Salt water gargles, magic mouthwash, OTC pain relievers prn   Relevant Orders   Rapid Strep screen([facility]) (Completed)   Culture, Group A Strep (Completed)   Thrush       Will start magic mouthwash, diflucan tablet and monitor for improvement   Relevant Medications   magic mouthwash w/lidocaine SOLN   Fever, unspecified fever cause  Push fluids, OTC pain relievers, monitor closely. Call if persistent       Follow up plan: Return if symptoms worsen or fail to improve.

## 2018-08-11 NOTE — Telephone Encounter (Signed)
Called pharmacy spoke with Darrick Meigs gave directions per Apolonio Schneiders, swish and spit and can change to 120 ml

## 2018-08-11 NOTE — Telephone Encounter (Signed)
[facility]  Phamacy calling back, need direction. Swish and spit or swallow? Ok to Switch quantity to 156ml?  Call back [phone]

## 2018-08-11 NOTE — Telephone Encounter (Signed)
Swish and spit, ok to change quantity

## 2018-08-13 ENCOUNTER — Telehealth: Payer: Self-pay | Admitting: Family Medicine

## 2018-08-13 MED ORDER — AZITHROMYCIN 250 MG PO TABS: ORAL_TABLET | ORAL | 0 refills | Status: DC

## 2018-08-13 NOTE — Telephone Encounter (Signed)
Left message on machine for pt that medication was sent to Pharmacy.

## 2018-08-13 NOTE — Telephone Encounter (Signed)
Copied from [place] [id number]. Topic: Quick Communication - See Telephone Encounter >> Aug 13, 2018  8:14 AM Margot Ables wrote: CRM for notification. See Telephone encounter for: 08/13/18. Pt mouth is some better, throat is still sore, pt now has yeast infection in groin. Requesting that RX for antibiotics be sent in to the pharmacy as was discussed 8/26 with Apolonio Schneiders. Please call to notify if sent in.  [facility] [address] [place] [phone] (Phone) [phone] (Fax)

## 2018-08-13 NOTE — Telephone Encounter (Signed)
Seen by rachel

## 2018-08-13 NOTE — Telephone Encounter (Signed)
Antibiotic sent, can get some clotrimazole cream OTC for groin rash

## 2018-08-13 NOTE — Addendum Note (Signed)
Addended by: Merrie Roof E on: 08/13/2018 11:43 AM   Modules accepted: Orders

## 2018-08-14 LAB — CULTURE, GROUP A STREP: Strep A Culture: NEGATIVE

## 2018-08-14 LAB — RAPID STREP SCREEN (MED CTR MEBANE ONLY): Strep Gp A Ag, IA W/Reflex: NEGATIVE

## 2018-08-14 NOTE — Patient Instructions (Signed)
Follow up as needed

## 2018-08-25 ENCOUNTER — Telehealth: Payer: Self-pay | Admitting: Family Medicine

## 2018-08-25 NOTE — Telephone Encounter (Signed)
Called and left VM to call us back  Copied from [place] [id number]. Topic: General - Other >> Aug 25, 2018  9:20 AM Judyann Munson wrote: Reason for CRM:  Patient is requesting a call back in regards to last visit that  he had stated he is wondering if he needs to  have more antibiotics called in. Please advise

## 2018-08-27 ENCOUNTER — Telehealth: Payer: Self-pay | Admitting: Family Medicine

## 2018-08-27 NOTE — Telephone Encounter (Signed)
Called and spoke with patient. He is better but has lingering cough. Has appointment to come see Dr. Julian Hy, DNP on Friday 08/29/18.

## 2018-08-27 NOTE — Telephone Encounter (Signed)
-----   Message from Stark Klein sent at 08/25/2018  4:32 PM EDT ----- Pt returned call

## 2018-08-27 NOTE — Telephone Encounter (Signed)
Returned pt call, he's improved but not completely better. Still having groin rash and some issues with scratchy throat and taste issues. Has appt scheduled for Friday morning with Malachy Mood. Recommended OTC lotrimin cream for groin rash

## 2018-08-29 ENCOUNTER — Encounter: Payer: Self-pay | Admitting: Unknown Physician Specialty

## 2018-08-29 ENCOUNTER — Ambulatory Visit: Payer: BLUE CROSS/BLUE SHIELD | Admitting: Unknown Physician Specialty

## 2018-08-29 VITALS — BP 160/92 | HR 81 | Temp 97.7°F | Ht 71.0 in | Wt 252.6 lb

## 2018-08-29 DIAGNOSIS — R7301 Impaired fasting glucose: Secondary | ICD-10-CM | POA: Diagnosis not present

## 2018-08-29 DIAGNOSIS — K140 Glossitis: Secondary | ICD-10-CM | POA: Diagnosis not present

## 2018-08-29 DIAGNOSIS — E119 Type 2 diabetes mellitus without complications: Secondary | ICD-10-CM

## 2018-08-29 DIAGNOSIS — E1159 Type 2 diabetes mellitus with other circulatory complications: Secondary | ICD-10-CM | POA: Insufficient documentation

## 2018-08-29 DIAGNOSIS — G4730 Sleep apnea, unspecified: Secondary | ICD-10-CM

## 2018-08-29 DIAGNOSIS — I129 Hypertensive chronic kidney disease with stage 1 through stage 4 chronic kidney disease, or unspecified chronic kidney disease: Secondary | ICD-10-CM

## 2018-08-29 DIAGNOSIS — R0683 Snoring: Secondary | ICD-10-CM | POA: Insufficient documentation

## 2018-08-29 LAB — BAYER DCA HB A1C WAIVED

## 2018-08-29 LAB — GLUCOSE PICCOLO, WAIVED: GLUCOSE PICCOLO, WAIVED: 485 mg/dL — AB (ref 73–118)

## 2018-08-29 MED ORDER — MAGIC MOUTHWASH W/LIDOCAINE: 5.0000 mL | Freq: Three times a day (TID) | ORAL | 1 refills | Status: DC | PRN

## 2018-08-29 MED ORDER — NYSTATIN 100000 UNIT/ML MT SUSP: 5.0000 mL | Freq: Four times a day (QID) | OROMUCOSAL | 0 refills | Status: DC

## 2018-08-29 MED ORDER — METFORMIN HCL 500 MG PO TABS: 1000.0000 mg | ORAL_TABLET | Freq: Two times a day (BID) | ORAL | 3 refills | Status: DC

## 2018-08-29 NOTE — Assessment & Plan Note (Addendum)
Glucose is 485 and Hgb A1C was greater than 14.  CMP in May shows normal kidney function.  Start Metformin with slow taper up.  Discussed self taper.  Start Toujeo 10 units QHS.  Use the following taper studies:  Base your long acting insulin on your fasting (usually in the morning) blood sugar.  Increase long acting (daily insulin) 2 units if fasting blood sugar is greater than 120.  Decrease by 2 units if fasting blood sugar is less than 95.  Refer to lifestyle center.  Stop sugar drinks

## 2018-08-29 NOTE — Assessment & Plan Note (Signed)
Wakes self up snoring.  Suspect sleep apnea due to obesity.

## 2018-08-29 NOTE — Progress Notes (Signed)
BP (!) 160/92 (BP Location: Left Arm, Cuff Size: Normal)   Pulse 81   Temp 97.7 F (36.5 C) (Oral)   Ht 5\' 11"  (1.803 m)   Wt 252 lb 9.6 oz (114.6 kg)   SpO2 95%   BMI 35.23 kg/m    Subjective:    Patient ID: Brian Frederick, male    DOB: 1956-11-17, 62 y.o.   MRN: [id number]  HPI: Brian Frederick is a 62 y.o. male  Chief Complaint  Patient presents with  . Thrush    pt states his thrush is better but still not completely gone   Pt states he has had thrush.  He has received a Zpack, anti-fungal, and a mouth wash.  Notes were reviewed.  States things are better, just not "right."  Food doesn't taste right, but food doesn't taste right.  Chart review shows visit on 8/26 with thrush and  multiple phone calls with continued symptoms.     Last labs showed impaired fasting glucose and Hgb A1C was 6.0.    Insomnia - frustrated because he sleeps for 3 hours, wakes up, watches TV and then goes back to sleep again.  Never did get a sleep study due to cost.    Family History  Problem Relation Age of Onset  . Cancer Mother        breast  . Heart disease Father 83       CABG  . Diabetes Father   . CAD Father   . Hyperlipidemia Father   . Hypertension Father   . Cancer Sister        breast  . Diabetes Paternal Grandmother   . Hypothyroidism Sister   . Stroke Maternal Grandfather      Relevant past medical, surgical, family and social history reviewed and updated as indicated. Interim medical history since our last visit reviewed. Allergies and medications reviewed and updated.  Review of Systems  Constitutional: Positive for fatigue.  HENT: Negative.   Eyes: Negative.   Respiratory: Negative.   Cardiovascular: Negative.   Gastrointestinal: Negative.   Musculoskeletal: Negative.   Psychiatric/Behavioral: Negative.     Per HPI unless specifically indicated above     Objective:    BP (!) 160/92 (BP Location: Left Arm, Cuff Size: Normal)   Pulse 81   Temp 97.7 F  (36.5 C) (Oral)   Ht 5\' 11"  (1.803 m)   Wt 252 lb 9.6 oz (114.6 kg)   SpO2 95%   BMI 35.23 kg/m   Wt Readings from Last 3 Encounters:  08/29/18 252 lb 9.6 oz (114.6 kg)  08/11/18 264 lb (119.7 kg)  05/02/18 271 lb 6.4 oz (123.1 kg)    Physical Exam  Constitutional: He is oriented to person, place, and time. He appears well-developed and well-nourished. No distress.  HENT:  Head: Normocephalic and atraumatic.  Some white patches in mouth.  Geographic tongue  Eyes: Conjunctivae and lids are normal. Right eye exhibits no discharge. Left eye exhibits no discharge. No scleral icterus.  Neck: Normal range of motion. Neck supple. No JVD present. Carotid bruit is not present.  Cardiovascular: Normal rate, regular rhythm and normal heart sounds.  Pulmonary/Chest: Effort normal and breath sounds normal. No respiratory distress.  Abdominal: Normal appearance. There is no splenomegaly or hepatomegaly.  Musculoskeletal: Normal range of motion.  Neurological: He is alert and oriented to person, place, and time.  Skin: Skin is warm, dry and intact. No rash noted. No pallor.  Psychiatric: He has  a normal mood and affect. His behavior is normal. Judgment and thought content normal.    Results for orders placed or performed in visit on 08/11/18  Rapid Strep screen([facility])  Result Value Ref Range   Strep Gp A Ag, IA W/Reflex Negative Negative  Culture, Group A Strep  Result Value Ref Range   Strep A Culture Negative       Assessment & Plan:   Problem List Items Addressed This Visit      Unprioritized   Benign hypertension with chronic kidney disease    Not to goal.  Currently just on metoprolol.  Add ACE or ARB on one week visit.  Check microalbumin      Diabetes mellitus without complication (HCC)    Glucose is 485 and Hgb A1C was greater than 14.  CMP in May shows normal kidney function.  Start Metformin with slow taper up.  Discussed self taper.  Start Toujeo 10 units QHS.   Use the following taper studies:  Base your long acting insulin on your fasting (usually in the morning) blood sugar.  Increase long acting (daily insulin) 2 units if fasting blood sugar is greater than 120.  Decrease by 2 units if fasting blood sugar is less than 95.  Refer to lifestyle center.  Stop sugar drinks       Relevant Medications   metFORMIN (GLUCOPHAGE) 500 MG tablet   Other Relevant Orders   Amb Referral to Nutrition and Diabetic E   RESOLVED: IFG (impaired fasting glucose) - Primary   Relevant Orders   Glucose Hemocue Waived   Bayer DCA Hb A1c Waived   Vitamin B12   Glucose Piccolo, Waived   Sleep apnea    Wakes self up snoring.  Suspect sleep apnea due to obesity.        Relevant Orders   Ambulatory referral to Sleep Studies    Other Visit Diagnoses    Glossitis       Due to thrush and high blood sugar.  Start Nystatin swich and swallow   Relevant Orders   Bayer DCA Hb A1c Waived      Discussed pt with Dr. Ezekiel Slocumb Check blood sugar, CMP, and Microalbumin next visit  Greater than 50% of this  40 minute visit was spent in counseling/coordination of care regarding    Follow up plan: Return in about 1 week (around 09/05/2018).

## 2018-08-29 NOTE — Assessment & Plan Note (Signed)
Not to goal.  Currently just on metoprolol.  Add ACE or ARB on one week visit.  Check microalbumin

## 2018-08-29 NOTE — Patient Instructions (Signed)
Base your long acting insulin on your fasting (usually in the morning) blood sugar.  Increase long acting (daily insulin) 2 units if fasting blood sugar is greater than 120.  Decrease by 2 units if fasting blood sugar is less than 95.    Start Metformin 500 mg daily and increase by one additional every week.  Goal is 500 mg 2 twice a day

## 2018-08-30 LAB — VITAMIN B12: Vitamin B-12: 437 pg/mL (ref 232–1245)

## 2018-09-01 ENCOUNTER — Telehealth: Payer: Self-pay | Admitting: Unknown Physician Specialty

## 2018-09-01 NOTE — Telephone Encounter (Signed)
Copied from [place] [phone] [id number]. Topic: Quick Communication - See Telephone Encounter >> Sep 01, 2018 10:49 AM Rutherford Nail, NT wrote: CRM for notification. See Telephone encounter for: 09/01/18. Patient wife, Juliann Pulse, calling and states that they need to know how to take the toujeo. States that she is certain that they are not doing it correctly. Please advise.  CB#: [phone]

## 2018-09-01 NOTE — Telephone Encounter (Signed)
Routing to provider for clarification on medication. Instructions in office visit note. Can I read those directions to the patient?

## 2018-09-01 NOTE — Telephone Encounter (Signed)
Called and gave the directions to the patient's wife per [place] office visit notes with the patient. Patient's wife verbalized understanding.

## 2018-09-01 NOTE — Progress Notes (Signed)
Brian Frederick was seen today in the movement disorders clinic for neurologic consultation at the request of Kathrine Haddock, NP.  The consultation is for the evaluation of tremor on the L.   04/22/09 update: Patient is seen today in follow-up.  Patient was diagnosed with Parkinson's disease last visit and started on pramipexole and work to 0.5 mg 3 times per day.  It slowed tremor down some but its not gone.  Patient denies compulsive behaviors.  Denies sleep attacks.  Pt denies falls.  Pt denies lightheadedness, near syncope.  No hallucinations.  Mood has been good.  He has gained weight but reports he retired at the beginning of the year and hasn't been eating or exercising like he should.  Does have a stationary bike.  09/02/18 update: Patient is seen today in follow-up for Parkinson's disease.  His pramipexole was increased last visit so that he is now taking 1 mg 3 times per day. He states that his ankle was swelling.  He cut back to 1mg , 0.5 mg in the afternoon, and he d/c the nighttime dosage because he was having trouble sleeping.  He is still having trouble sleeping.  He has had no compulsive behaviors or sleep attacks.  No hallucinations.  No lightheadedness or near syncope.  Records are reviewed since last visit.  He saw his nurse practitioner on August 29, 2018.  Sleep study was ordered for suspected sleep apnea.  In addition, his hemoglobin A1c was greater than 14.  Metformin was added.  He didn't know that he was diabetic previously.  He was dx after having thrush  PREVIOUS MEDICATIONS: none to date  ALLERGIES:   Allergies  Allergen Reactions  . Amoxil [Amoxicillin] Swelling  . Penicillin G Benzathine Swelling    CURRENT MEDICATIONS:  Outpatient Encounter Medications as of 09/02/2018  Medication Sig  . aspirin 81 MG tablet Take 81 mg by mouth daily.  [name] Kitchen atorvastatin (LIPITOR) 20 MG tablet take 1 tablet by mouth once daily  . escitalopram (LEXAPRO) 10 MG tablet Take 1 tablet (10  mg total) by mouth daily.  . lansoprazole (PREVACID) 30 MG capsule Take 1 capsule (30 mg total) by mouth daily.  . magic mouthwash w/lidocaine SOLN Take 5 mLs by mouth 3 (three) times daily as needed for mouth pain.  . meloxicam (MOBIC) 15 MG tablet Take 1 tablet (15 mg total) by mouth daily.  . metFORMIN (GLUCOPHAGE) 500 MG tablet Take 2 tablets (1,000 mg total) by mouth 2 (two) times daily with a meal. Slow taper up.  Start with 500 mg and add one 500 mg pill weekly  . metoprolol succinate (TOPROL-XL) 100 MG 24 hr tablet Take 1 tablet (100 mg total) by mouth daily. Take with or immediately following a meal.  . pramipexole (MIRAPEX) 1 MG tablet Take 1 tablet (1 mg total) by mouth 3 (three) times daily.  . Tadalafil (CIALIS) 2.5 MG TABS Take 1 tablet (2.5 mg total) daily by mouth.  . [DISCONTINUED] nystatin (MYCOSTATIN) 100000 UNIT/ML suspension Take 5 mLs (500,000 Units total) by mouth 4 (four) times daily.   No facility-administered encounter medications on file as of 09/02/2018.     PAST MEDICAL HISTORY:   Past Medical History:  Diagnosis Date  . Anxiety   . BPH (benign prostatic hypertrophy)   . Cancer ([place])    basal cell  . Chronic kidney disease   . Hyperlipidemia   . Hypertension   . Hypogonadism in male   . Osteoarthritis   .  Prediabetes   . SVT (supraventricular tachycardia) ([place])     PAST SURGICAL HISTORY:   Past Surgical History:  Procedure Laterality Date  . BASAL CELL CARCINOMA EXCISION    . cardio oblation    . CHOLECYSTECTOMY    . COLONOSCOPY WITH PROPOFOL N/A 04/12/2017   Procedure: COLONOSCOPY WITH PROPOFOL;  Surgeon: [name], MD;  Location: [facility];  Service: Endoscopy;  Laterality: N/A;  . JOINT REPLACEMENT Left    shoulder  . POLYPECTOMY N/A 04/12/2017   Procedure: POLYPECTOMY;  Surgeon: [name], MD;  Location: [facility];  Service: Endoscopy;  Laterality: N/A;    SOCIAL HISTORY:   Social History   Socioeconomic History  .  Marital status: Married    Spouse name: Not on file  . Number of children: Not on file  . Years of education: Not on file  . Highest education level: Not on file  Occupational History  . Occupation: retired    Comment: Manufacturing systems engineer  . Financial resource strain: Not on file  . Food insecurity:    Worry: Not on file    Inability: Not on file  . Transportation needs:    Medical: Not on file    Non-medical: Not on file  Tobacco Use  . Smoking status: Never Smoker  . Smokeless tobacco: Never Used  Substance and Sexual Activity  . Alcohol use: No    Alcohol/week: 0.0 standard drinks  . Drug use: No  . Sexual activity: Yes  Lifestyle  . Physical activity:    Days per week: Not on file    Minutes per session: Not on file  . Stress: Not on file  Relationships  . Social connections:    Talks on phone: Not on file    Gets together: Not on file    Attends religious service: Not on file    Active member of club or organization: Not on file    Attends meetings of clubs or organizations: Not on file    Relationship status: Not on file  . Intimate partner violence:    Fear of current or ex partner: Not on file    Emotionally abused: Not on file    Physically abused: Not on file    Forced sexual activity: Not on file  Other Topics Concern  . Not on file  Social History Narrative  . Not on file    FAMILY HISTORY:   Family Status  Relation Name Status  . Mother  Deceased at age 77  . Father  Deceased at age 92  . Sister  Alive  . PGM  Deceased  . Sister  Alive  . Daughter  Alive  . MGM  Deceased  . MGF  Deceased  . PGF  Deceased  . Sister  Alive  . Daughter  Alive  . Daughter  Alive    ROS:  Review of Systems  Constitutional: Positive for weight loss.  HENT: Negative.   Eyes: Negative.   Respiratory: Negative.   Cardiovascular: Negative.   Gastrointestinal: Positive for constipation (improving).  Musculoskeletal: Negative.   Skin: Negative.     Endo/Heme/Allergies: Negative.      PHYSICAL EXAMINATION:    VITALS:   Vitals:   09/02/18 1035  BP: 124/80  Pulse: 78  SpO2: 94%  Weight: 254 lb (115.2 kg)  Height: 5\' 11"  (1.803 m)   Wt Readings from Last 3 Encounters:  09/02/18 254 lb (115.2 kg)  08/29/18 252 lb 9.6 oz (114.6  kg)  08/11/18 264 lb (119.7 kg)     GEN:  The patient appears stated age and is in NAD. HEENT:  Normocephalic, atraumatic.  The mucous membranes are moist. The superficial temporal arteries are without ropiness or tenderness. CV:  RRR Lungs:  CTAB Neck/HEME:  There are no carotid bruits bilaterally.  Neurological examination:  Orientation: The patient is alert and oriented x3. Cranial nerves: There is good facial symmetry. The speech is fluent and clear. Soft palate rises symmetrically and there is no tongue deviation. Hearing is intact to conversational tone. Sensation: Sensation is intact to light touch throughout Motor: Strength is 5/5 in the bilateral upper and lower extremities.   Shoulder shrug is equal and symmetric.  There is no pronator drift.  Movement examination: Tone: There is mild increased tone in the LUE and LLE Abnormal movements: There is LUE resting tremor that increases with distraction (same as previous) Coordination:  There is mild trouble with hand opening/closing on the L but otherwise RAMs are good (same as last visit) Gait and Station: The patient has no difficulty arising out of a deep-seated chair without the use of the hands. The patient's stride length is normal.  Arm swing is good.  He is able to run down the hall without trouble    Labs:    Chemistry      Component Value Date/Time   NA 143 05/02/2018 0841   NA 142 09/02/2013 0220   K 4.1 05/02/2018 0841   K 4.2 09/02/2013 0220   CL 100 05/02/2018 0841   CL 107 09/02/2013 0220   CO2 23 05/02/2018 0841   CO2 25 09/02/2013 0220   BUN 15 05/02/2018 0841   BUN 23 (H) 09/02/2013 0220   CREATININE 1.19  05/02/2018 0841   CREATININE 1.52 (H) 09/02/2013 0220      Component Value Date/Time   CALCIUM 9.2 05/02/2018 0841   CALCIUM 9.4 09/02/2013 0220   ALKPHOS 99 05/02/2018 0841   ALKPHOS 107 09/02/2013 0220   AST 25 05/02/2018 0841   AST 30 09/02/2013 0220   ALT 28 05/02/2018 0841   ALT 38 09/02/2013 0220   BILITOT 0.4 05/02/2018 0841   BILITOT 0.3 09/02/2013 0220     Lab Results  Component Value Date   TSH 2.670 05/02/2018     ASSESSMENT/PLAN:  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  We discussed treatment options as well as prognostic indicators.  Patient education was provided.  -We discussed that it used to be thought that levodopa would increase risk of melanoma but now it is believed that Parkinsons itself likely increases risk of melanoma. he is to get regular skin checks.  His last visit was November.  Told him to update dermatologist on this dx  -increase pramipexole 1 mg back to tid at 7am/11am/4pm.  Told him not cause of sleepiness or constipation (constipation from the disease)  2. uncontrolled DM, just dx  -HgA1C >14.  Talked about proper diet and exercise.  A1C went from 6.0 less than year ago to >14.  Awaiting nutrition cx  3.  Weight loss  -most due to being sick before dx with DM  4.  insomnia  -add melatonin.  He doesn't want additional meds right now.  He   -PSG was ordered.  He isn't sure that he wants to follow through because of cost.  5.  R shoulder pain  -due to have sx  next month.  Told him to call surgeon as they may not want to proceed yet given uncontrolled, newly dx DM  6.  Follow up is anticipated in the next few months, sooner should new neurologic issues arise.  Much greater than 50% of this visit was spent in counseling and coordinating care.  Total face to face time:  25 min   Cc:  Kathrine Haddock, NP

## 2018-09-01 NOTE — Telephone Encounter (Signed)
Yes, I did give them written instructions as well.  Take it once a day and increase dose as written based on fasting blood sugar.  He started with 10 units.

## 2018-09-02 ENCOUNTER — Encounter: Payer: Self-pay | Admitting: Neurology

## 2018-09-02 ENCOUNTER — Ambulatory Visit: Payer: BLUE CROSS/BLUE SHIELD | Admitting: Neurology

## 2018-09-02 VITALS — BP 124/80 | HR 78 | Ht 71.0 in | Wt 254.0 lb

## 2018-09-02 DIAGNOSIS — M25511 Pain in right shoulder: Secondary | ICD-10-CM | POA: Diagnosis not present

## 2018-09-02 DIAGNOSIS — G20 Parkinson's disease: Secondary | ICD-10-CM

## 2018-09-02 DIAGNOSIS — G4700 Insomnia, unspecified: Secondary | ICD-10-CM

## 2018-09-02 DIAGNOSIS — K5901 Slow transit constipation: Secondary | ICD-10-CM

## 2018-09-02 DIAGNOSIS — E119 Type 2 diabetes mellitus without complications: Secondary | ICD-10-CM

## 2018-09-02 NOTE — Patient Instructions (Addendum)
Take pramipexole 1 mg, 1 tablet at 7am/11am/4pm  Try melatonin - 3mg -8mg  - 1 tablet 1 hour before beds

## 2018-09-05 ENCOUNTER — Encounter: Payer: Self-pay | Admitting: Unknown Physician Specialty

## 2018-09-05 ENCOUNTER — Ambulatory Visit: Payer: BLUE CROSS/BLUE SHIELD | Admitting: Unknown Physician Specialty

## 2018-09-05 VITALS — BP 133/83 | HR 73 | Temp 99.0°F | Ht 71.0 in | Wt 257.6 lb

## 2018-09-05 DIAGNOSIS — E119 Type 2 diabetes mellitus without complications: Secondary | ICD-10-CM

## 2018-09-05 DIAGNOSIS — E1169 Type 2 diabetes mellitus with other specified complication: Secondary | ICD-10-CM | POA: Insufficient documentation

## 2018-09-05 DIAGNOSIS — R809 Proteinuria, unspecified: Secondary | ICD-10-CM | POA: Diagnosis not present

## 2018-09-05 DIAGNOSIS — E669 Obesity, unspecified: Secondary | ICD-10-CM | POA: Insufficient documentation

## 2018-09-05 DIAGNOSIS — E1129 Type 2 diabetes mellitus with other diabetic kidney complication: Secondary | ICD-10-CM | POA: Insufficient documentation

## 2018-09-05 LAB — MICROALBUMIN, URINE WAIVED
Creatinine, Urine Waived: 200 mg/dL (ref 10–300)
Microalb, Ur Waived: 80 mg/L — ABNORMAL HIGH (ref 0–19)

## 2018-09-05 MED ORDER — INSULIN GLARGINE 300 UNIT/ML ~~LOC~~ SOPN: 60.0000 [IU] | PEN_INJECTOR | Freq: Every day | SUBCUTANEOUS | 3 refills | Status: DC

## 2018-09-05 MED ORDER — LISINOPRIL 2.5 MG PO TABS: 2.5000 mg | ORAL_TABLET | Freq: Every day | ORAL | 1 refills | Status: DC

## 2018-09-05 MED ORDER — INSULIN PEN NEEDLE 32G X 4 MM MISC: 1.0000 [IU] | Freq: Every morning | 3 refills | Status: DC

## 2018-09-05 NOTE — Assessment & Plan Note (Addendum)
Start Lisionpril 2.5 mg.  Pt ed on side-effects.  CMP in 2 weeks

## 2018-09-05 NOTE — Assessment & Plan Note (Addendum)
Starting lifestyle classes on Wednesday.  Continue insulin titration as directed.  Continue Metformin titration.

## 2018-09-05 NOTE — Progress Notes (Signed)
BP 133/83   Pulse 73   Temp 99 F (37.2 C) (Oral)   Ht 5\' 11"  (1.803 m)   Wt 257 lb 9.6 oz (116.8 kg)   SpO2 98%   BMI 35.93 kg/m    Subjective:    Patient ID: Brian Frederick, male    DOB: 1956-10-01, 62 y.o.   MRN: [id number]  HPI: [name] is a 62 y.o. male  Chief Complaint  Patient presents with  . Hyperglycemia    1 week f/up   Diabetes: Last visit started on daily insulin.  Currently taking 18u with self titration.  Started on Metformin.  Pt reports doing much better with BS now 220.   No hypoglycemic episodes  No hyperglycemic episodes Feet problems: none Blood Sugars averaging: eye exam within last year Last Hgb A1C: greater than 14  Relevant past medical, surgical, family and social history reviewed and updated as indicated. Interim medical history since our last visit reviewed. Allergies and medications reviewed and updated.  Review of Systems  Per HPI unless specifically indicated above     Objective:    BP 133/83   Pulse 73   Temp 99 F (37.2 C) (Oral)   Ht 5\' 11"  (1.803 m)   Wt 257 lb 9.6 oz (116.8 kg)   SpO2 98%   BMI 35.93 kg/m   Wt Readings from Last 3 Encounters:  09/05/18 257 lb 9.6 oz (116.8 kg)  09/02/18 254 lb (115.2 kg)  08/29/18 252 lb 9.6 oz (114.6 kg)    Physical Exam  Constitutional: He is oriented to person, place, and time. He appears well-developed and well-nourished. No distress.  HENT:  Head: Normocephalic and atraumatic.  Eyes: Conjunctivae and lids are normal. Right eye exhibits no discharge. Left eye exhibits no discharge. No scleral icterus.  Neck: Normal range of motion. Neck supple. No JVD present. Carotid bruit is not present.  Cardiovascular: Normal rate, regular rhythm and normal heart sounds.  Pulmonary/Chest: Effort normal and breath sounds normal. No respiratory distress.  Abdominal: Normal appearance. There is no splenomegaly or hepatomegaly.  Musculoskeletal: Normal range of motion.  Neurological: He  is alert and oriented to person, place, and time.  Skin: Skin is warm, dry and intact. No rash noted. No pallor.  Psychiatric: He has a normal mood and affect. His behavior is normal. Judgment and thought content normal.    Results for orders placed or performed in visit on 08/29/18  Vitamin B12  Result Value Ref Range   Vitamin B-12 437 232 - 1,245 pg/mL  Bayer DCA Hb A1c Waived  Result Value Ref Range   HB A1C (BAYER DCA - WAIVED) >14.0 (H) <7.0 %  Glucose Piccolo, Waived  Result Value Ref Range   Glucose Piccolo, Waived 485 (H) 73 - 118 mg/dL      Assessment & Plan:   Problem List Items Addressed This Visit      Unprioritized   Diabetes mellitus without complication ([place]) - Primary    Starting lifestyle classes on Wednesday.  Continue insulin titration as directed.  Continue Metformin titration.       Relevant Medications   lisinopril (PRINIVIL,ZESTRIL) 2.5 MG tablet   Insulin Glargine (TOUJEO MAX SOLOSTAR) 300 UNIT/ML SOPN   Other Relevant Orders   Comprehensive metabolic panel   Microalbumin, Urine Waived   Microalbuminuria    Start Lisionpril 2.5 mg.  Pt ed on side-effects.  CMP in 2 weeks          Follow  up plan: Return in about 2 weeks (around 09/19/2018).

## 2018-09-06 LAB — COMPREHENSIVE METABOLIC PANEL
ALK PHOS: 101 IU/L (ref 39–117)
ALT: 55 IU/L — AB (ref 0–44)
AST: 48 IU/L — ABNORMAL HIGH (ref 0–40)
Albumin/Globulin Ratio: 2.2 (ref 1.2–2.2)
Albumin: 4.2 g/dL (ref 3.6–4.8)
BUN / CREAT RATIO: 20 (ref 10–24)
BUN: 17 mg/dL (ref 8–27)
Bilirubin Total: 0.5 mg/dL (ref 0.0–1.2)
CALCIUM: 9.3 mg/dL (ref 8.6–10.2)
CO2: 25 mmol/L (ref 20–29)
CREATININE: 0.87 mg/dL (ref 0.76–1.27)
Chloride: 100 mmol/L (ref 96–106)
GFR, EST AFRICAN AMERICAN: 107 mL/min/{1.73_m2} (ref >59)
GFR, EST NON AFRICAN AMERICAN: 92 mL/min/{1.73_m2} (ref >59)
GLOBULIN, TOTAL: 1.9 g/dL (ref 1.5–4.5)
Glucose: 199 mg/dL — ABNORMAL HIGH (ref 65–99)
Potassium: 3.9 mmol/L (ref 3.5–5.2)
Sodium: 141 mmol/L (ref 134–144)
Total Protein: 6.1 g/dL (ref 6.0–8.5)

## 2018-09-08 NOTE — Progress Notes (Signed)
Notified pt by mychart

## 2018-09-10 ENCOUNTER — Encounter: Payer: BLUE CROSS/BLUE SHIELD | Attending: Unknown Physician Specialty | Admitting: Dietician

## 2018-09-10 ENCOUNTER — Encounter: Payer: Self-pay | Admitting: Dietician

## 2018-09-10 VITALS — Ht 71.0 in | Wt 261.1 lb

## 2018-09-10 DIAGNOSIS — E119 Type 2 diabetes mellitus without complications: Secondary | ICD-10-CM

## 2018-09-10 DIAGNOSIS — Z794 Long term (current) use of insulin: Secondary | ICD-10-CM | POA: Diagnosis not present

## 2018-09-10 NOTE — Patient Instructions (Signed)
   Increase portions of low-carb "free" veggies for better nutrition and help control portions of other foods.   Allow for some small-medium fruit portions for nutrition and fiber. Combine with protein to minimize effect on blood sugar.   Continue with healthy choices and regular eating pattern, great job!  Stay active as much as possible to help combat any insulin resistance in the body and burn calories and sugar.

## 2018-09-10 NOTE — Progress Notes (Signed)
Medical Nutrition Therapy: Visit start time: 6962  end time: 1145  Assessment:  Diagnosis: Type 2 diabetes Past medical history: HTN, hypertriglyceridemia, Parkinson's disease Psychosocial issues/ stress concerns: none  Preferred learning method:  . Hands-on   Current weight: 261.1lbs Height: 5'11" Medications, supplements: reconciled list in medical record  Progress and evaluation: Patient reports improvement in BGs since diagnosis 2 weeks ago; he is testing fasting BGs with results now in low 200s, down from 300s.  He has made significant changes to reduce carbohydrate intake, especially breads and sweets. He reports being sick for 1 month recently, including thrush infection, which triggered diabetes to develop.  Physical activity: none at this time  Dietary Intake:  Usual eating pattern includes 3 meals and 3 snacks per day. Dining out frequency: 2-3 meals per week.  Breakfast: 6-7am eggs with cheese and sausage, stopped toast Snack: cheese, pepperoni slices Lunch: soup, deli meat chicken or ham, stopped bread Snack: same as am, sometimes with crackers; protein bar Supper: cubed steak, fish, 9/24 ravioli; burger and brown rice; salad Snack: peanuts/ walnuts or pretzels; carrots and celery with Ranch dip Beverages: water, sugar free flavoring, stopped coffee (was using cream and sugar)  Nutrition Care Education: Topics covered: diabetes, weight control Basic nutrition: basic food groups, appropriate nutrient balance, appropriate meal and snack schedule, general nutrition guidelines    Weight control: benefits of weight control, importance of lowfat and low sugar food choices, appropriate food portions Advanced nutrition: food label reading Diabetes: appropriate meal and snack schedule, appropriate carb intake and balance, diabetes medications and how they affect BGs,  Hyperlipidemia: healthy and unhealthy fats, role of fiber   Nutritional Diagnosis:  [place]-2.2 Altered  nutrition-related laboratory As related to diabetes.  As evidenced by patient with recent HbA1C of >14%; patient report of home blood glucose results. [place]-3.3 Overweight/obesity As related to excess calories and inactivity.  As evidenced by pateint with BMI of 36, and patient report of dietary intake.  Intervention: Instruction as noted above.   Commended patient and spouse for changes made thus far.    Advised consistent carb intake with meals; set goals with direction from patient.    Patient declined scheduling follow-up at this time, will schedule after next MD visit if needed.   Education Materials given:  . General diet guidelines for Diabetes . Plate Planner with food lists . Food lists/ Planning A Balanced Meal . Sample meal pattern/ menus . Diabetes and You booklet ([place]) . Goals/ instructions   Learner/ who was taught:  . Patient  . Spouse/ partner  Level of understanding: [name] Kitchen Verbalizes/ demonstrates competency   Demonstrated degree of understanding via:   Teach back Learning barriers: . None   Willingness to learn/ readiness for change: . Eager, change in progress   Monitoring and Evaluation:  Dietary intake, exercise, BG control, and body weight      follow up: prn

## 2018-09-19 ENCOUNTER — Encounter: Payer: Self-pay | Admitting: Unknown Physician Specialty

## 2018-09-19 ENCOUNTER — Ambulatory Visit: Payer: BLUE CROSS/BLUE SHIELD | Admitting: Unknown Physician Specialty

## 2018-09-19 DIAGNOSIS — E119 Type 2 diabetes mellitus without complications: Secondary | ICD-10-CM

## 2018-09-19 NOTE — Assessment & Plan Note (Signed)
Patient will continue to take Metformin 1000mg  BID, decrease insulin glargine to 30 units/day, and add Ozempic 0.25mg  weekly for the next 4 weeks.  He has been instructed to continue AM blood sugar checks with decrease by 2 units if blood glucose is less than 90

## 2018-09-19 NOTE — Progress Notes (Signed)
BP 135/83 (BP Location: Right Arm, Patient Position: Sitting, Cuff Size: Normal)   Pulse 76   Temp 97.7 F (36.5 C) (Oral)   Ht 5\' 11"  (1.803 m)   Wt 258 lb 6.4 oz (117.2 kg)   SpO2 98%   BMI 36.04 kg/m    Subjective:    Patient ID: Brian Frederick, male    DOB: Feb 22, 1956, 62 y.o.   MRN: [id number]  HPI: Brian Frederick is a 62 y.o. male  Chief Complaint  Patient presents with  . Follow-up    2 week follow-up, no complaints.   Diabetes: Using medications without difficulties. Patient able to teach back how he takes his medications without prompting. States blood sugars are now below 130 with basal insulin at 30 units.   No hypoglycemic episodes No hyperglycemic episodes Feet problems: Not assessed at this visit. Patient with no complaints at this time. Blood Sugars averaging: eye exam within last year: Needs evaluated at physical exam in 1 month Last Hgb A1C: >14    Relevant past medical, surgical, family and social history reviewed and updated as indicated. Interim medical history since our last visit reviewed. Allergies and medications reviewed and updated.   Per HPI unless specifically indicated above     Objective:    BP 135/83 (BP Location: Right Arm, Patient Position: Sitting, Cuff Size: Normal)   Pulse 76   Temp 97.7 F (36.5 C) (Oral)   Ht 5\' 11"  (1.803 m)   Wt 258 lb 6.4 oz (117.2 kg)   SpO2 98%   BMI 36.04 kg/m   Wt Readings from Last 3 Encounters:  09/19/18 258 lb 6.4 oz (117.2 kg)  09/10/18 261 lb 1.6 oz (118.4 kg)  09/05/18 257 lb 9.6 oz (116.8 kg)    Physical Exam  Constitutional: He is oriented to person, place, and time. He appears well-developed and well-nourished. No distress.  HENT:  Head: Normocephalic and atraumatic.  Mouth/Throat: Oropharynx is clear and moist.  Eyes: Conjunctivae and lids are normal. Right eye exhibits no discharge. Left eye exhibits no discharge. No scleral icterus.  Neck: Normal range of motion. Neck supple. No  JVD present. Carotid bruit is not present.  Cardiovascular: Normal rate, regular rhythm and normal heart sounds.  Pulmonary/Chest: Effort normal and breath sounds normal. No respiratory distress.  Abdominal: Soft. Normal appearance and bowel sounds are normal. There is no splenomegaly or hepatomegaly. There is no tenderness.  Musculoskeletal: Normal range of motion.  Neurological: He is alert and oriented to person, place, and time.  Skin: Skin is warm, dry and intact. No rash noted. No pallor.  Psychiatric: He has a normal mood and affect. His behavior is normal. Judgment and thought content normal.    Results for orders placed or performed in visit on 09/05/18  Comprehensive metabolic panel  Result Value Ref Range   Glucose 199 (H) 65 - 99 mg/dL   BUN 17 8 - 27 mg/dL   Creatinine, Ser 0.87 0.76 - 1.27 mg/dL   GFR calc non Af Amer 92 >59 mL/min/1.73   GFR calc Af Amer 107 >59 mL/min/1.73   BUN/Creatinine Ratio 20 10 - 24   Sodium 141 134 - 144 mmol/L   Potassium 3.9 3.5 - 5.2 mmol/L   Chloride 100 96 - 106 mmol/L   CO2 25 20 - 29 mmol/L   Calcium 9.3 8.6 - 10.2 mg/dL   Total Protein 6.1 6.0 - 8.5 g/dL   Albumin 4.2 3.6 - 4.8 g/dL  Globulin, Total 1.9 1.5 - 4.5 g/dL   Albumin/Globulin Ratio 2.2 1.2 - 2.2   Bilirubin Total 0.5 0.0 - 1.2 mg/dL   Alkaline Phosphatase 101 39 - 117 IU/L   AST 48 (H) 0 - 40 IU/L   ALT 55 (H) 0 - 44 IU/L  Microalbumin, Urine Waived  Result Value Ref Range   Microalb, Ur Waived 80 (H) 0 - 19 mg/L   Creatinine, Urine Waived 200 10 - 300 mg/dL   Microalb/Creat Ratio 30-300 (H) <30 mg/g      Assessment & Plan:   Problem List Items Addressed This Visit      Unprioritized   Diabetes mellitus without complication ([place])    Patient will continue to take Metformin 1000mg  BID, decrease insulin glargine to 30 units/day, and add Ozempic 0.25mg  weekly for the next 4 weeks.  He has been instructed to continue AM blood sugar checks with decrease by 2 units if  blood glucose is less than 90          Follow up plan: Follow up in one month

## 2018-11-03 ENCOUNTER — Other Ambulatory Visit: Payer: Self-pay

## 2018-11-03 ENCOUNTER — Ambulatory Visit: Payer: BLUE CROSS/BLUE SHIELD | Admitting: Nurse Practitioner

## 2018-11-03 ENCOUNTER — Encounter: Payer: Self-pay | Admitting: Nurse Practitioner

## 2018-11-03 ENCOUNTER — Ambulatory Visit: Payer: BLUE CROSS/BLUE SHIELD | Admitting: Unknown Physician Specialty

## 2018-11-03 VITALS — BP 127/85 | HR 75 | Temp 98.7°F | Ht 71.0 in | Wt 255.0 lb

## 2018-11-03 DIAGNOSIS — E119 Type 2 diabetes mellitus without complications: Secondary | ICD-10-CM

## 2018-11-03 DIAGNOSIS — I129 Hypertensive chronic kidney disease with stage 1 through stage 4 chronic kidney disease, or unspecified chronic kidney disease: Secondary | ICD-10-CM

## 2018-11-03 DIAGNOSIS — E7800 Pure hypercholesterolemia, unspecified: Secondary | ICD-10-CM

## 2018-11-03 LAB — LIPID PANEL PICCOLO, WAIVED
CHOLESTEROL PICCOLO, WAIVED: 111 mg/dL (ref <200)
Chol/HDL Ratio Piccolo,Waive: 3.3 mg/dL
HDL Chol Piccolo, Waived: 34 mg/dL — ABNORMAL LOW (ref >59)
LDL Chol Calc Piccolo Waived: 57 mg/dL (ref <100)
TRIGLYCERIDES PICCOLO,WAIVED: 100 mg/dL (ref <150)
VLDL Chol Calc Piccolo,Waive: 20 mg/dL (ref <30)

## 2018-11-03 LAB — BAYER DCA HB A1C WAIVED: HB A1C (BAYER DCA - WAIVED): 7.3 % — ABNORMAL HIGH (ref <7.0)

## 2018-11-03 MED ORDER — BLOOD GLUCOSE MONITOR KIT: PACK | 2 refills | Status: AC

## 2018-11-03 NOTE — Patient Instructions (Signed)
Diabetes Mellitus and Nutrition When you have diabetes (diabetes mellitus), it is very important to have healthy eating habits because your blood sugar (glucose) levels are greatly affected by what you eat and drink. Eating healthy foods in the appropriate amounts, at about the same times every day, can help you:  Control your blood glucose.  Lower your risk of heart disease.  Improve your blood pressure.  Reach or maintain a healthy weight.  Every person with diabetes is different, and each person has different needs for a meal plan. Your health care provider may recommend that you work with a diet and nutrition specialist (dietitian) to make a meal plan that is best for you. Your meal plan may vary depending on factors such as:  The calories you need.  The medicines you take.  Your weight.  Your blood glucose, blood pressure, and cholesterol levels.  Your activity level.  Other health conditions you have, such as heart or kidney disease.  How do carbohydrates affect me? Carbohydrates affect your blood glucose level more than any other type of food. Eating carbohydrates naturally increases the amount of glucose in your blood. Carbohydrate counting is a method for keeping track of how many carbohydrates you eat. Counting carbohydrates is important to keep your blood glucose at a healthy level, especially if you use insulin or take certain oral diabetes medicines. It is important to know how many carbohydrates you can safely have in each meal. This is different for every person. Your dietitian can help you calculate how many carbohydrates you should have at each meal and for snack. Foods that contain carbohydrates include:  Bread, cereal, rice, pasta, and crackers.  Potatoes and corn.  Peas, beans, and lentils.  Milk and yogurt.  Fruit and juice.  Desserts, such as cakes, cookies, ice cream, and candy.  How does alcohol affect me? Alcohol can cause a sudden decrease in blood  glucose (hypoglycemia), especially if you use insulin or take certain oral diabetes medicines. Hypoglycemia can be a life-threatening condition. Symptoms of hypoglycemia (sleepiness, dizziness, and confusion) are similar to symptoms of having too much alcohol. If your health care provider says that alcohol is safe for you, follow these guidelines:  Limit alcohol intake to no more than 1 drink per day for nonpregnant women and 2 drinks per day for men. One drink equals 12 oz of beer, 5 oz of wine, or 1 oz of hard liquor.  Do not drink on an empty stomach.  Keep yourself hydrated with water, diet soda, or unsweetened iced tea.  Keep in mind that regular soda, juice, and other mixers may contain a lot of sugar and must be counted as carbohydrates.  What are tips for following this plan? Reading food labels  Start by checking the serving size on the label. The amount of calories, carbohydrates, fats, and other nutrients listed on the label are based on one serving of the food. Many foods contain more than one serving per package.  Check the total grams (g) of carbohydrates in one serving. You can calculate the number of servings of carbohydrates in one serving by dividing the total carbohydrates by 15. For example, if a food has 30 g of total carbohydrates, it would be equal to 2 servings of carbohydrates.  Check the number of grams (g) of saturated and trans fats in one serving. Choose foods that have low or no amount of these fats.  Check the number of milligrams (mg) of sodium in one serving. Most people   should limit total sodium intake to less than 2,300 mg per day.  Always check the nutrition information of foods labeled as "low-fat" or "nonfat". These foods may be higher in added sugar or refined carbohydrates and should be avoided.  Talk to your dietitian to identify your daily goals for nutrients listed on the label. Shopping  Avoid buying canned, premade, or processed foods. These  foods tend to be high in fat, sodium, and added sugar.  Shop around the outside edge of the grocery store. This includes fresh fruits and vegetables, bulk grains, fresh meats, and fresh dairy. Cooking  Use low-heat cooking methods, such as baking, instead of high-heat cooking methods like deep frying.  Cook using healthy oils, such as olive, canola, or sunflower oil.  Avoid cooking with butter, cream, or high-fat meats. Meal planning  Eat meals and snacks regularly, preferably at the same times every day. Avoid going long periods of time without eating.  Eat foods high in fiber, such as fresh fruits, vegetables, beans, and whole grains. Talk to your dietitian about how many servings of carbohydrates you can eat at each meal.  Eat 4-6 ounces of lean protein each day, such as lean meat, chicken, fish, eggs, or tofu. 1 ounce is equal to 1 ounce of meat, chicken, or fish, 1 egg, or 1/4 cup of tofu.  Eat some foods each day that contain healthy fats, such as avocado, nuts, seeds, and fish. Lifestyle   Check your blood glucose regularly.  Exercise at least 30 minutes 5 or more days each week, or as told by your health care provider.  Take medicines as told by your health care provider.  Do not use any products that contain nicotine or tobacco, such as cigarettes and e-cigarettes. If you need help quitting, ask your health care provider.  Work with a counselor or diabetes educator to identify strategies to manage stress and any emotional and social challenges. What are some questions to ask my health care provider?  Do I need to meet with a diabetes educator?  Do I need to meet with a dietitian?  What number can I call if I have questions?  When are the best times to check my blood glucose? Where to find more information:  American Diabetes Association: diabetes.org/food-and-fitness/food  Academy of Nutrition and Dietetics:  www.eatright.org/resources/health/diseases-and-conditions/diabetes  National Institute of Diabetes and Digestive and Kidney Diseases (NIH): www.niddk.nih.gov/health-information/diabetes/overview/diet-eating-physical-activity Summary  A healthy meal plan will help you control your blood glucose and maintain a healthy lifestyle.  Working with a diet and nutrition specialist (dietitian) can help you make a meal plan that is best for you.  Keep in mind that carbohydrates and alcohol have immediate effects on your blood glucose levels. It is important to count carbohydrates and to use alcohol carefully. This information is not intended to replace advice given to you by your health care provider. Make sure you discuss any questions you have with your health care provider. Document Released: 08/30/2005 Document Revised: 01/07/2017 Document Reviewed: 01/07/2017 Elsevier Interactive Patient Education  2018 Elsevier Inc.  

## 2018-11-03 NOTE — Assessment & Plan Note (Signed)
Chronic, ongoing.  Continue Atorvastatin.  Lipid panel today TCHOL 111 and LDL 57.

## 2018-11-03 NOTE — Progress Notes (Signed)
BP 127/85   Pulse 75   Temp 98.7 F (37.1 C) (Oral)   Ht 5\' 11"  (1.803 m)   Wt 255 lb (115.7 kg)   SpO2 97%   BMI 35.57 kg/m    Subjective:    Patient ID: Brian Frederick, male    DOB: [birth year], 62 y.o.   MRN: [id number]  HPI: Brian Frederick is a 62 y.o. male presents for f/u T2DM and HTN/HLD  Chief Complaint  Patient presents with  . Diabetes    72m f/u  . Hyperlipidemia  . Hypertension   DIABETES At last visit 4 weeks ago Ozempic was added, along with his daily Glargine and Metformin d/t A1C >14.  Is aware that A1C may still be elevated today d/t timing since last A1C.  FSBS at home have improved.  Toujeo currently at 24 units at night, previously was 40 units prior to [place].  Ozempic currently on 0.5MG  weekly.  Metformin currently at 1000MG  BID and he reports some GI issues, belching.  Is scheduled for shoulder surgery in December and goal A1C for ortho is <8, at goal today. Hypoglycemic episodes:no Polydipsia/polyuria: no Visual disturbance: no Chest pain: no Paresthesias: no Glucose Monitoring: yes  Accucheck frequency: Daily  Fasting glucose: 90 - 120 in morning -- he reports these are improved from previous  Post prandial:  Evening:  Before meals: Taking Insulin?: yes  Long acting insulin:  Short acting insulin: Blood Pressure Monitoring: weekly Retinal Examination: Not up to Date Foot Exam: Up to Date Diabetic Education: Completed Pneumovax: Up to Date Influenza: Up to Date Aspirin: yes   HYPERTENSION / HYPERLIPIDEMIA Continues Lisinopril which offers kidney protection. Satisfied with current treatment? yes Duration of hypertension: chronic BP monitoring frequency: weekly BP range:  BP medication side effects: no Past BP meds: none Duration of hyperlipidemia: chronic Cholesterol medication side effects: no Cholesterol supplements: none Past cholesterol medications: none Medication compliance: excellent compliance Aspirin: yes Recent stressors:  no Recurrent headaches: no Visual changes: no Palpitations: no Dyspnea: no Chest pain: no Lower extremity edema: no Dizzy/lightheaded: no  Relevant past medical, surgical, family and social history reviewed and updated as indicated. Interim medical history since our last visit reviewed. Allergies and medications reviewed and updated.  Review of Systems  Constitutional: Negative for activity change, appetite change, diaphoresis, fatigue and fever.  Respiratory: Negative for cough, chest tightness, shortness of breath and wheezing.   Cardiovascular: Negative for chest pain, palpitations and leg swelling.  Gastrointestinal: Negative for abdominal distention, abdominal pain, constipation, diarrhea, nausea and vomiting.  Endocrine: Negative for cold intolerance, heat intolerance, polydipsia, polyphagia and polyuria.  Genitourinary: Negative.   Musculoskeletal: Negative.   Neurological: Negative for dizziness, syncope, weakness, numbness and headaches.  Psychiatric/Behavioral: Negative for behavioral problems, confusion and sleep disturbance. The patient is not nervous/anxious.    Per HPI unless specifically indicated above     Objective:    BP 127/85   Pulse 75   Temp 98.7 F (37.1 C) (Oral)   Ht 5\' 11"  (1.803 m)   Wt 255 lb (115.7 kg)   SpO2 97%   BMI 35.57 kg/m   Wt Readings from Last 3 Encounters:  11/03/18 255 lb (115.7 kg)  09/19/18 258 lb 6.4 oz (117.2 kg)  09/10/18 261 lb 1.6 oz (118.4 kg)    Physical Exam  Constitutional: He is oriented to person, place, and time. He appears well-developed and well-nourished.  HENT:  Head: Normocephalic and atraumatic.  Right Ear: Hearing normal. No drainage.  Left Ear: Hearing normal. No drainage.  Mouth/Throat: Uvula is midline and mucous membranes are normal.  Eyes: Pupils are equal, round, and reactive to light. Conjunctivae, EOM and lids are normal. Right eye exhibits no discharge. Left eye exhibits no discharge.  Neck:  Trachea normal and normal range of motion. Neck supple. No JVD present. Carotid bruit is not present. No thyromegaly present.  Cardiovascular: Normal rate, regular rhythm, S1 normal, S2 normal and normal heart sounds. Exam reveals no gallop.  No murmur heard. Pulmonary/Chest: Effort normal and breath sounds normal.  Abdominal: Soft. Bowel sounds are normal. There is no splenomegaly or hepatomegaly.  Musculoskeletal: Normal range of motion.  Neurological: He is alert and oriented to person, place, and time. He has normal reflexes.  Skin: Skin is warm, dry and intact. Capillary refill takes less than 2 seconds. No rash noted.  Psychiatric: He has a normal mood and affect. His behavior is normal. Judgment and thought content normal.  Nursing note and vitals reviewed.  Diabetic Foot Exam - Simple   Simple Foot Form Visual Inspection No deformities, no ulcerations, no other skin breakdown bilaterally:  Yes Sensation Testing See comments:  Yes Pulse Check Posterior Tibialis and Dorsalis pulse intact bilaterally:  Yes Comments Slight decrease sensation in bilateral feet 6/10.      Results for orders placed or performed in visit on 09/05/18  Comprehensive metabolic panel  Result Value Ref Range   Glucose 199 (H) 65 - 99 mg/dL   BUN 17 8 - 27 mg/dL   Creatinine, Ser 0.87 0.76 - 1.27 mg/dL   GFR calc non Af Amer 92 >59 mL/min/1.73   GFR calc Af Amer 107 >59 mL/min/1.73   BUN/Creatinine Ratio 20 10 - 24   Sodium 141 134 - 144 mmol/L   Potassium 3.9 3.5 - 5.2 mmol/L   Chloride 100 96 - 106 mmol/L   CO2 25 20 - 29 mmol/L   Calcium 9.3 8.6 - 10.2 mg/dL   Total Protein 6.1 6.0 - 8.5 g/dL   Albumin 4.2 3.6 - 4.8 g/dL   Globulin, Total 1.9 1.5 - 4.5 g/dL   Albumin/Globulin Ratio 2.2 1.2 - 2.2   Bilirubin Total 0.5 0.0 - 1.2 mg/dL   Alkaline Phosphatase 101 39 - 117 IU/L   AST 48 (H) 0 - 40 IU/L   ALT 55 (H) 0 - 44 IU/L  Microalbumin, Urine Waived  Result Value Ref Range   Microalb, Ur  Waived 80 (H) 0 - 19 mg/L   Creatinine, Urine Waived 200 10 - 300 mg/dL   Microalb/Creat Ratio 30-300 (H) <30 mg/g      Assessment & Plan:   Problem List Items Addressed This Visit      Cardiovascular and Mediastinum   Benign hypertension with chronic kidney disease    Chronic, goal <130/90.  BP at goal today.  Continue Lisinopril and Metoprolol.  Labs in 3 months.        Endocrine   Diabetes mellitus without complication ([facility])    Chronic, ongoing.  Last A1C >14 and today 7.3%.  Improved for surgery, with goal <8.  Continue current medication regimen with sample of Ozempic provided. Continue to reduce Toujeo dose based on FSBS at home per previous instructions.  Return in 3 months.      Relevant Orders   Bayer DCA Hb A1c Waived (STAT)   Lipid Panel [place]o, Waived     Other   Hypercholesterolemia    Chronic, ongoing.  Continue Atorvastatin.  Lipid  panel today TCHOL 111 and LDL 57.            Follow up plan: Return in about 3 months (around 02/03/2019) for T2DM.

## 2018-11-03 NOTE — Assessment & Plan Note (Addendum)
Chronic, ongoing.  Last A1C >14 and today 7.3%.  Improved for surgery, with goal <8.  Continue current medication regimen with sample of Ozempic provided. Continue to reduce Toujeo dose based on FSBS at home per previous instructions.  Return in 3 months.

## 2018-11-03 NOTE — Assessment & Plan Note (Signed)
Chronic, goal <130/90.  BP at goal today.  Continue Lisinopril and Metoprolol.  Labs in 3 months.

## 2018-11-14 ENCOUNTER — Other Ambulatory Visit: Payer: Self-pay | Admitting: Family Medicine

## 2018-11-28 HISTORY — PX: SHOULDER SURGERY: SHX246

## 2018-12-21 ENCOUNTER — Other Ambulatory Visit: Payer: Self-pay | Admitting: Unknown Physician Specialty

## 2018-12-22 NOTE — Telephone Encounter (Signed)
Requested Prescriptions  Pending Prescriptions Disp Refills  . lansoprazole (PREVACID) 30 MG capsule [Pharmacy Med Name: LANSOPRAZOLE 30MG  DR CAPSULES] 90 capsule 0    Sig: TAKE 1 CAPSULE BY MOUTH ONCE DAILY     Gastroenterology: Proton Pump Inhibitors Passed - 12/21/2018 12:27 PM      Passed - Valid encounter within last 12 months    Recent Outpatient Visits          1 month ago Diabetes mellitus without complication ([place])   [facility] [place], Jolene T, NP   3 months ago Diabetes mellitus without complication ([place])   [place] Kathrine Haddock, NP   3 months ago Diabetes mellitus without complication ([place])   [facility] Kathrine Haddock, NP   3 months ago IFG (impaired fasting glucose)   [facility] Kathrine Haddock, NP   4 months ago Pharyngitis, unspecified etiology   [place], Lilia Argue, PA-C      Future Appointments            In 1 month Cannady, Barbaraann Faster, NP [employer] [employer]

## 2019-01-01 NOTE — Progress Notes (Signed)
Brian Frederick was seen today in the movement disorders clinic for neurologic consultation at the request of Kathrine Haddock, NP.  The consultation is for the evaluation of tremor on the L.   04/22/09 update: Patient is seen today in follow-up.  Patient was diagnosed with Parkinson's disease last visit and started on pramipexole and work to 0.5 mg 3 times per day.  It slowed tremor down some but its not gone.  Patient denies compulsive behaviors.  Denies sleep attacks.  Pt denies falls.  Pt denies lightheadedness, near syncope.  No hallucinations.  Mood has been good.  He has gained weight but reports he retired at the beginning of the year and hasn't been eating or exercising like he should.  Does have a stationary bike.  09/02/18 update: Patient is seen today in follow-up for Parkinson's disease.  His pramipexole was increased last visit so that he is now taking 1 mg 3 times per day. He states that his ankle was swelling.  He cut back to 84m, 0.5 mg in the afternoon, and he d/c the nighttime dosage because he was having trouble sleeping.  He is still having trouble sleeping.  He has had no compulsive behaviors or sleep attacks.  No hallucinations.  No lightheadedness or near syncope.  Records are reviewed since last visit.  He saw his nurse practitioner on August 29, 2018.  Sleep study was ordered for suspected sleep apnea.  In addition, his hemoglobin A1c was greater than 14.  Metformin was added.  He didn't know that he was diabetic previously.  He was dx after having thrush  01/06/19 update: Patient seen today in follow-up for Parkinson's disease.  Patient is on pramipexole, 1 mg 3 times per day.  He reports no sleep attacks.  No compulsive behaviors. No lightheadedness or near syncope.  He does state that he recently slipped going up a wooden ramp that was wet and slippery and he had stuff in his hands.  He fell on his knees.  I have reviewed records since our last visit.  His A1c is much better  than it was last visit.  It is now down to 7.3.  He had a total shoulder replacement last month and just started PT.  Its not really painful.  The arm is still stiff, however.  PREVIOUS MEDICATIONS: none to date  ALLERGIES:   Allergies  Allergen Reactions  . Amoxil [Amoxicillin] Swelling  . Penicillin G Benzathine Swelling    CURRENT MEDICATIONS:  Outpatient Encounter Medications as of 01/06/2019  Medication Sig  . aspirin 81 MG tablet Take 81 mg by mouth daily.  .[name] Kitchenatorvastatin (LIPITOR) 20 MG tablet take 1 tablet by mouth once daily  . blood glucose meter kit and supplies KIT Dispense based on patient and insurance preference. Use up to four times daily as directed. (FOR ICD-9 250.00, 250.01).  .[name] Kitchenescitalopram (LEXAPRO) 10 MG tablet TAKE 1 TABLET BY MOUTH ONCE DAILY  . Insulin Glargine (TOUJEO MAX SOLOSTAR) 300 UNIT/ML SOPN Inject 60 Units into the skin daily.  . Insulin Pen Needle 32G X 4 MM MISC 1 Units by Does not apply route every morning. Pen needles  . lansoprazole (PREVACID) 30 MG capsule TAKE 1 CAPSULE BY MOUTH ONCE DAILY  . lisinopril (PRINIVIL,ZESTRIL) 2.5 MG tablet Take 1 tablet (2.5 mg total) by mouth daily.  . meloxicam (MOBIC) 15 MG tablet Take 1 tablet (15 mg total) by mouth daily.  . metFORMIN (GLUCOPHAGE) 500 MG tablet TAKE 2 TABLETS  BY MOUTH TWICE DAILY WITH FOOD. SLOW TAPER UP. START WITH 1 TABLET AND ADD 1 TABLET EVERY WEEK.  . metoprolol succinate (TOPROL-XL) 100 MG 24 hr tablet Take 1 tablet (100 mg total) by mouth daily. Take with or immediately following a meal.  . pramipexole (MIRAPEX) 1 MG tablet Take 1 tablet (1 mg total) by mouth 3 (three) times daily.  . Tadalafil (CIALIS) 2.5 MG TABS Take 1 tablet (2.5 mg total) daily by mouth.  . [DISCONTINUED] metFORMIN (GLUCOPHAGE) 500 MG tablet Take 2 tablets (1,000 mg total) by mouth 2 (two) times daily with a meal. Slow taper up.  Start with 500 mg and add one 500 mg pill weekly   No facility-administered encounter  medications on file as of 01/06/2019.     PAST MEDICAL HISTORY:   Past Medical History:  Diagnosis Date  . Anxiety   . BPH (benign prostatic hypertrophy)   . Cancer ([place])    basal cell  . Chronic kidney disease   . Hyperlipidemia   . Hypertension   . Hypogonadism in male   . Osteoarthritis   . Prediabetes   . SVT (supraventricular tachycardia) ([place])     PAST SURGICAL HISTORY:   Past Surgical History:  Procedure Laterality Date  . BASAL CELL CARCINOMA EXCISION    . cardio oblation    . CHOLECYSTECTOMY    . COLONOSCOPY WITH PROPOFOL N/A 04/12/2017   Procedure: COLONOSCOPY WITH PROPOFOL;  Surgeon: [name], MD;  Location: [facility];  Service: Endoscopy;  Laterality: N/A;  . JOINT REPLACEMENT Left    shoulder  . POLYPECTOMY N/A 04/12/2017   Procedure: POLYPECTOMY;  Surgeon: [name], MD;  Location: [facility];  Service: Endoscopy;  Laterality: N/A;  . SHOULDER SURGERY Right 11/28/2018    SOCIAL HISTORY:   Social History   Socioeconomic History  . Marital status: Married    Spouse name: Not on file  . Number of children: Not on file  . Years of education: Not on file  . Highest education level: Not on file  Occupational History  . Occupation: retired    Comment: Manufacturing systems engineer  . Financial resource strain: Not on file  . Food insecurity:    Worry: Not on file    Inability: Not on file  . Transportation needs:    Medical: Not on file    Non-medical: Not on file  Tobacco Use  . Smoking status: Never Smoker  . Smokeless tobacco: Never Used  Substance and Sexual Activity  . Alcohol use: No    Alcohol/week: 0.0 standard drinks  . Drug use: No  . Sexual activity: Yes  Lifestyle  . Physical activity:    Days per week: Not on file    Minutes per session: Not on file  . Stress: Not on file  Relationships  . Social connections:    Talks on phone: Not on file    Gets together: Not on file    Attends religious service: Not on file     Active member of club or organization: Not on file    Attends meetings of clubs or organizations: Not on file    Relationship status: Not on file  . Intimate partner violence:    Fear of current or ex partner: Not on file    Emotionally abused: Not on file    Physically abused: Not on file    Forced sexual activity: Not on file  Other Topics Concern  . Not on file  Social History Narrative  . Not on file    FAMILY HISTORY:   Family Status  Relation Name Status  . Mother  Deceased at age 14  . Father  Deceased at age 51  . Sister  Alive  . PGM  Deceased  . Sister  Alive  . Daughter  Alive  . MGM  Deceased  . MGF  Deceased  . PGF  Deceased  . Sister  Alive  . Daughter  Alive  . Daughter  Alive    ROS:  Review of Systems  Constitutional: Negative.   HENT: Negative.   Eyes: Negative.   Respiratory: Negative.   Cardiovascular: Negative.   Gastrointestinal: Negative.   Genitourinary: Negative.   Skin: Negative.      PHYSICAL EXAMINATION:    VITALS:   Vitals:   01/06/19 1459  BP: 128/76  Pulse: 84  SpO2: 94%  Weight: 263 lb (119.3 kg)  Height: '5\' 11"'$  (1.803 m)   Wt Readings from Last 3 Encounters:  01/06/19 263 lb (119.3 kg)  11/03/18 255 lb (115.7 kg)  09/19/18 258 lb 6.4 oz (117.2 kg)    GEN:  The patient appears stated age and is in NAD. HEENT:  Normocephalic, atraumatic.  The mucous membranes are moist. The superficial temporal arteries are without ropiness or tenderness. CV:  RRR Lungs:  CTAB Neck/HEME:  There are no carotid bruits bilaterally.  Neurological examination:  Orientation: The patient is alert and oriented x3. Cranial nerves: There is good facial symmetry. The speech is fluent and clear. Soft palate rises symmetrically and there is no tongue deviation. Hearing is intact to conversational tone. Sensation: Sensation is intact to light touch throughout Motor: Strength is 5/5 in the left upper and bilateral lower extremities.  He has  trouble abducting the right arm above 90 degrees.  Shoulder shrug is equal and symmetric.  There is no pronator drift.  Movement examination: Tone: There is good tone in the UE/LE Abnormal movements: There is LUE resting tremor.   Coordination:  There is no decremation, with any form of RAMS, including alternating supination and pronation of the forearm, hand opening and closing, finger taps, heel taps bilaterally.  Minimal trouble with toe taps on the L Gait and Station: The patient has no difficulty arising out of a deep-seated chair without the use of the hands. The patient's stride length is normal.  Arm swing is good.    Labs:    Chemistry      Component Value Date/Time   NA 141 09/05/2018 0920   NA 142 09/02/2013 0220   K 3.9 09/05/2018 0920   K 4.2 09/02/2013 0220   CL 100 09/05/2018 0920   CL 107 09/02/2013 0220   CO2 25 09/05/2018 0920   CO2 25 09/02/2013 0220   BUN 17 09/05/2018 0920   BUN 23 (H) 09/02/2013 0220   CREATININE 0.87 09/05/2018 0920   CREATININE 1.52 (H) 09/02/2013 0220      Component Value Date/Time   CALCIUM 9.3 09/05/2018 0920   CALCIUM 9.4 09/02/2013 0220   ALKPHOS 101 09/05/2018 0920   ALKPHOS 107 09/02/2013 0220   AST 48 (H) 09/05/2018 0920   AST 30 09/02/2013 0220   ALT 55 (H) 09/05/2018 0920   ALT 38 09/02/2013 0220   BILITOT 0.5 09/05/2018 0920   BILITOT 0.3 09/02/2013 0220     Lab Results  Component Value Date   TSH 2.670 05/02/2018     ASSESSMENT/PLAN:  1. idiopathic PD.  Dx  on 01/16/18  -We discussed the diagnosis as well as pathophysiology of the disease.  We discussed treatment options as well as prognostic indicators.  Patient education was provided.  -We discussed that it used to be thought that levodopa would increase risk of melanoma but now it is believed that Parkinsons itself likely increases risk of melanoma. he is to get regular skin checks.    -much better than pramipexole, 1 mg tid.  Risks, benefits, side effects and  alternative therapies were discussed.  The opportunity to ask questions was given and they were answered to the best of my ability.  The patient expressed understanding and willingness to follow the outlined treatment protocols.  -Talked about getting back to safe, cardiovascular exercise once he is able to.  He just had a shoulder replacement and is working on rehabbing that.  2. Diabetes mellitus  -control is much better than previous  3.  insomnia  -add melatonin.  He doesn't want additional meds right now.   -PSG was ordered but states that insurance denied it.  He has no EDS, however.  4.  Follow up is anticipated in the next few months, sooner should new neurologic issues arise.  Much greater than 50% of this visit was spent in counseling and coordinating care.  Total face to face time:  25 min  Cc:  [name], NP

## 2019-01-05 ENCOUNTER — Other Ambulatory Visit: Payer: Self-pay | Admitting: Unknown Physician Specialty

## 2019-01-05 NOTE — Telephone Encounter (Signed)
Please review for tapered medication, metformin 500 mg tab.

## 2019-01-06 ENCOUNTER — Ambulatory Visit: Payer: PRIVATE HEALTH INSURANCE | Admitting: Neurology

## 2019-01-06 ENCOUNTER — Encounter: Payer: Self-pay | Admitting: Neurology

## 2019-01-06 VITALS — BP 128/76 | HR 84 | Ht 71.0 in | Wt 263.0 lb

## 2019-01-06 DIAGNOSIS — G20 Parkinson's disease: Secondary | ICD-10-CM

## 2019-01-06 NOTE — Patient Instructions (Signed)
You look good.  When you can get back to exercise, do that.  Continue pramipexole, 1 mg three times per day.

## 2019-01-13 ENCOUNTER — Other Ambulatory Visit: Payer: Self-pay | Admitting: Nurse Practitioner

## 2019-01-13 MED ORDER — SEMAGLUTIDE 7 MG PO TABS: 7.0000 mg | ORAL_TABLET | Freq: Every day | ORAL | 3 refills | Status: DC

## 2019-01-13 NOTE — Progress Notes (Signed)
Ozempic injection samples not available in office.  Have been instructed by reps to send in oral dosing and provide coupon at this time for patient to assist with cost.  Script sent.

## 2019-01-15 ENCOUNTER — Telehealth: Payer: Self-pay | Admitting: Nurse Practitioner

## 2019-01-15 NOTE — Telephone Encounter (Signed)
PA submitted via cover my meds for Rybelsus, awaiting approval or denial.

## 2019-01-15 NOTE — Telephone Encounter (Signed)
Copied from [place] [phone]. Topic: Quick Communication - Rx Refill/Question >> Jan 15, 2019 10:17 AM Margot Ables wrote: Medication: RYBELSUS (Semaglutide 7 MG TABS) - pt states the pharmacy told him the medication is on hold for prior authorization from provider - pt states that he would like a return call for status update on PA

## 2019-01-19 ENCOUNTER — Telehealth: Payer: Self-pay | Admitting: Nurse Practitioner

## 2019-01-19 NOTE — Telephone Encounter (Signed)
Copied from [place] [id number]. Topic: Quick Communication - See Telephone Encounter >> Jan 19, 2019  8:43 AM Blase Mess A wrote: CRM for notification. See Telephone encounter for: 01/19/19.  Patient is calling regarding a medication the [employer] wrote.   The Patient was told from the pharmacy that the medication is $700.  The patient is wanting to know can he get the sample for Ozempic or if there is a different thought? Please advise [phone]

## 2019-01-19 NOTE — Telephone Encounter (Signed)
Spoke with pt and asked pt to come by and pick up discount card to make that will make his medication $10 if it is still too expensive with his insurance.

## 2019-02-03 ENCOUNTER — Other Ambulatory Visit: Payer: Self-pay

## 2019-02-03 ENCOUNTER — Ambulatory Visit (INDEPENDENT_AMBULATORY_CARE_PROVIDER_SITE_OTHER): Payer: PRIVATE HEALTH INSURANCE | Admitting: Nurse Practitioner

## 2019-02-03 ENCOUNTER — Encounter: Payer: Self-pay | Admitting: Nurse Practitioner

## 2019-02-03 VITALS — BP 130/76 | HR 81 | Temp 98.0°F | Ht 71.0 in | Wt 261.5 lb

## 2019-02-03 DIAGNOSIS — E119 Type 2 diabetes mellitus without complications: Secondary | ICD-10-CM | POA: Diagnosis not present

## 2019-02-03 DIAGNOSIS — E7800 Pure hypercholesterolemia, unspecified: Secondary | ICD-10-CM

## 2019-02-03 DIAGNOSIS — I129 Hypertensive chronic kidney disease with stage 1 through stage 4 chronic kidney disease, or unspecified chronic kidney disease: Secondary | ICD-10-CM | POA: Diagnosis not present

## 2019-02-03 LAB — BAYER DCA HB A1C WAIVED: HB A1C: 5.7 % (ref <7.0)

## 2019-02-03 MED ORDER — SEMAGLUTIDE(0.25 OR 0.5MG/DOS) 2 MG/1.5ML ~~LOC~~ SOPN: 0.2500 mg | PEN_INJECTOR | SUBCUTANEOUS | 5 refills | Status: DC

## 2019-02-03 MED ORDER — SEMAGLUTIDE(0.25 OR 0.5MG/DOS) 2 MG/1.5ML ~~LOC~~ SOPN: 0.2500 mg | PEN_INJECTOR | SUBCUTANEOUS | 1 refills | Status: DC

## 2019-02-03 NOTE — Assessment & Plan Note (Addendum)
Chronic, ongoing. Discussed decreased control with recent medication changes. Recommended pt stop Rybelsus oral and start Ozempic injectable today. Provided sample of Ozempic with coupon card (to start at 0.25 MG weekly x 4 weeks and then increase to 0.5 MG weekly as he previously did). A1C today 5.7%. Continue metformin and Toujeo as ordered, with reduction in Toujeo dose based on FSBS findings with re initiation of injectable Ozempic. Continue to monitor BG daily. Foot exam due next visit. Discussed need for ophthalmology exam annually. Follow-up in 3 months.

## 2019-02-03 NOTE — Patient Instructions (Addendum)
Diabetes Mellitus and Nutrition, Adult  When you have diabetes (diabetes mellitus), it is very important to have healthy eating habits because your blood sugar (glucose) levels are greatly affected by what you eat and drink. Eating healthy foods in the appropriate amounts, at about the same times every day, can help you:  · Control your blood glucose.  · Lower your risk of heart disease.  · Improve your blood pressure.  · Reach or maintain a healthy weight.  Every person with diabetes is different, and each person has different needs for a meal plan. Your health care provider may recommend that you work with a diet and nutrition specialist (dietitian) to make a meal plan that is best for you. Your meal plan may vary depending on factors such as:  · The calories you need.  · The medicines you take.  · Your weight.  · Your blood glucose, blood pressure, and cholesterol levels.  · Your activity level.  · Other health conditions you have, such as heart or kidney disease.  How do carbohydrates affect me?  Carbohydrates, also called carbs, affect your blood glucose level more than any other type of food. Eating carbs naturally raises the amount of glucose in your blood. Carb counting is a method for keeping track of how many carbs you eat. Counting carbs is important to keep your blood glucose at a healthy level, especially if you use insulin or take certain oral diabetes medicines.  It is important to know how many carbs you can safely have in each meal. This is different for every person. Your dietitian can help you calculate how many carbs you should have at each meal and for each snack.  Foods that contain carbs include:  · Bread, cereal, rice, pasta, and crackers.  · Potatoes and corn.  · Peas, beans, and lentils.  · Milk and yogurt.  · Fruit and juice.  · Desserts, such as cakes, cookies, ice cream, and candy.  How does alcohol affect me?  Alcohol can cause a sudden decrease in blood glucose (hypoglycemia),  especially if you use insulin or take certain oral diabetes medicines. Hypoglycemia can be a life-threatening condition. Symptoms of hypoglycemia (sleepiness, dizziness, and confusion) are similar to symptoms of having too much alcohol.  If your health care provider says that alcohol is safe for you, follow these guidelines:  · Limit alcohol intake to no more than 1 drink per day for nonpregnant women and 2 drinks per day for men. One drink equals 12 oz of beer, 5 oz of wine, or 1½ oz of hard liquor.  · Do not drink on an empty stomach.  · Keep yourself hydrated with water, diet soda, or unsweetened iced tea.  · Keep in mind that regular soda, juice, and other mixers may contain a lot of sugar and must be counted as carbs.  What are tips for following this plan?    Reading food labels  · Start by checking the serving size on the "Nutrition Facts" label of packaged foods and drinks. The amount of calories, carbs, fats, and other nutrients listed on the label is based on one serving of the item. Many items contain more than one serving per package.  · Check the total grams (g) of carbs in one serving. You can calculate the number of servings of carbs in one serving by dividing the total carbs by 15. For example, if a food has 30 g of total carbs, it would be equal to 2   servings of carbs.  · Check the number of grams (g) of saturated and trans fats in one serving. Choose foods that have low or no amount of these fats.  · Check the number of milligrams (mg) of salt (sodium) in one serving. Most people should limit total sodium intake to less than 2,300 mg per day.  · Always check the nutrition information of foods labeled as "low-fat" or "nonfat". These foods may be higher in added sugar or refined carbs and should be avoided.  · Talk to your dietitian to identify your daily goals for nutrients listed on the label.  Shopping  · Avoid buying canned, premade, or processed foods. These foods tend to be high in fat, sodium,  and added sugar.  · Shop around the outside edge of the grocery store. This includes fresh fruits and vegetables, bulk grains, fresh meats, and fresh dairy.  Cooking  · Use low-heat cooking methods, such as baking, instead of high-heat cooking methods like deep frying.  · Cook using healthy oils, such as olive, canola, or sunflower oil.  · Avoid cooking with butter, cream, or high-fat meats.  Meal planning  · Eat meals and snacks regularly, preferably at the same times every day. Avoid going long periods of time without eating.  · Eat foods high in fiber, such as fresh fruits, vegetables, beans, and whole grains. Talk to your dietitian about how many servings of carbs you can eat at each meal.  · Eat 4-6 ounces (oz) of lean protein each day, such as lean meat, chicken, fish, eggs, or tofu. One oz of lean protein is equal to:  ? 1 oz of meat, chicken, or fish.  ? 1 egg.  ? ¼ cup of tofu.  · Eat some foods each day that contain healthy fats, such as avocado, nuts, seeds, and fish.  Lifestyle  · Check your blood glucose regularly.  · Exercise regularly as told by your health care provider. This may include:  ? 150 minutes of moderate-intensity or vigorous-intensity exercise each week. This could be brisk walking, biking, or water aerobics.  ? Stretching and doing strength exercises, such as yoga or weightlifting, at least 2 times a week.  · Take medicines as told by your health care provider.  · Do not use any products that contain nicotine or tobacco, such as cigarettes and e-cigarettes. If you need help quitting, ask your health care provider.  · Work with a counselor or diabetes educator to identify strategies to manage stress and any emotional and social challenges.  Questions to ask a health care provider  · Do I need to meet with a diabetes educator?  · Do I need to meet with a dietitian?  · What number can I call if I have questions?  · When are the best times to check my blood glucose?  Where to find more  information:  · American Diabetes Association: diabetes.org  · Academy of Nutrition and Dietetics: www.eatright.org  · National Institute of Diabetes and Digestive and Kidney Diseases (NIH): www.niddk.nih.gov  Summary  · A healthy meal plan will help you control your blood glucose and maintain a healthy lifestyle.  · Working with a diet and nutrition specialist (dietitian) can help you make a meal plan that is best for you.  · Keep in mind that carbohydrates (carbs) and alcohol have immediate effects on your blood glucose levels. It is important to count carbs and to use alcohol carefully.  This information is not intended to   replace advice given to you by your health care provider. Make sure you discuss any questions you have with your health care provider.  Document Released: 08/30/2005 Document Revised: 07/03/2017 Document Reviewed: 01/07/2017  Elsevier Interactive Patient Education © 2019 [employer]

## 2019-02-03 NOTE — Addendum Note (Signed)
Addended by: [name] T on: 02/03/2019 10:15 AM   Modules accepted: Orders

## 2019-02-03 NOTE — Assessment & Plan Note (Signed)
Chronic, stable. Well controlled with current medication regimen. Continue medication as prescribed. Continue to monitor BP weekly. Follow-up in 3 months.

## 2019-02-03 NOTE — Progress Notes (Signed)
BP 130/76 (BP Location: Left Arm, Patient Position: Sitting, Cuff Size: Normal)   Pulse 81   Temp 98 F (36.7 C) (Oral)   Ht 5' 11"  (1.803 m)   Wt 261 lb 8 oz (118.6 kg)   SpO2 97%   BMI 36.47 kg/m    Subjective:    Patient ID: Brian Frederick, male    DOB: 03-10-1956, 63 y.o.   MRN: [id number]  HPI: Brian Frederick is a 63 y.o. male  Chief Complaint  Patient presents with  . Diabetes    follow up on his diabetes   DIABETES Continues on Toujeo, Metformin, and Ozempic (Rybelsus) oral.  Was previously taking Ozempic injectionable, but had issues with coverage.  Last A1C in November was 7.3, trending downwards with addition of Ozempic as previous in September had been 14.   Since transition to Rybelsus (oral) pt reports increased FBG levels and Toujeo requirements from 24 units to 36 units. Pt reports strict adherence to diet and exercise. Pt reports he has not had ophthalmology exam due to recent shoulder surgery, but plans to make appointment in near future.     Hypoglycemic episodes:no Polydipsia/polyuria: no Visual disturbance: no Chest pain: no Paresthesias: no Glucose Monitoring: yes  Accucheck frequency: Daily  Fasting glucose: high 98 to 158  Taking Insulin?: yes  Long acting insulin: Toujeo  Blood Pressure Monitoring: weekly Retinal Examination: Not up to Date Foot Exam:  Due May 2020 with annual exam Diabetic Education: Completed Pneumovax: Up to Date Influenza: Up to Date Aspirin: yes   HYPERTENSION / HYPERLIPIDEMIA Pt taking atorvastatin (Lipitor) 68m for HLD and lisinopril 2.539mand metoprolol-XL 10061maily for HTN. Satisfied with current treatment? yes Duration of hypertension: chronic BP monitoring frequency: weekly BP range: 120's-130's/80's BP medication side effects: no Duration of hyperlipidemia: chronic Cholesterol medication side effects: no Cholesterol supplements: none Medication compliance: good compliance Aspirin: yes Recent stressors:  no Recurrent headaches: no Visual changes: no Palpitations: no Dyspnea: no Chest pain: no Lower extremity edema: no Dizzy/lightheaded: no  Relevant past medical, surgical, family and social history reviewed and updated as indicated. Interim medical history since our last visit reviewed. Allergies and medications reviewed and updated.  Review of Systems  Constitutional: Negative for activity change, diaphoresis, fatigue and fever.  Respiratory: Negative for cough, chest tightness, shortness of breath and wheezing.   Cardiovascular: Negative for chest pain, palpitations and leg swelling.  Gastrointestinal: Negative for abdominal distention, abdominal pain, constipation, diarrhea, nausea and vomiting.  Endocrine: Negative for cold intolerance, heat intolerance, polydipsia, polyphagia and polyuria.  Musculoskeletal: Negative.   Skin: Negative.   Neurological: Negative for dizziness, syncope, weakness, light-headedness, numbness and headaches.  Psychiatric/Behavioral: Negative.     Per HPI unless specifically indicated above     Objective:    BP 130/76 (BP Location: Left Arm, Patient Position: Sitting, Cuff Size: Normal)   Pulse 81   Temp 98 F (36.7 C) (Oral)   Ht 5' 11"  (1.803 m)   Wt 261 lb 8 oz (118.6 kg)   SpO2 97%   BMI 36.47 kg/m   Wt Readings from Last 3 Encounters:  02/03/19 261 lb 8 oz (118.6 kg)  01/06/19 263 lb (119.3 kg)  11/03/18 255 lb (115.7 kg)    Physical Exam Vitals signs and nursing note reviewed.  Constitutional:      Appearance: He is well-developed.  HENT:     Head: Normocephalic and atraumatic.     Right Ear: Hearing normal. No drainage.  Left Ear: Hearing normal. No drainage.     Mouth/Throat:     Pharynx: Uvula midline.  Eyes:     General: Lids are normal.        Right eye: No discharge.        Left eye: No discharge.     Conjunctiva/sclera: Conjunctivae normal.     Pupils: Pupils are equal, round, and reactive to light.  Neck:      Musculoskeletal: Normal range of motion and neck supple.     Thyroid: No thyromegaly.     Vascular: No carotid bruit or JVD.     Trachea: Trachea normal.  Cardiovascular:     Rate and Rhythm: Normal rate and regular rhythm.     Heart sounds: Normal heart sounds, S1 normal and S2 normal. No murmur. No gallop.   Pulmonary:     Effort: Pulmonary effort is normal.     Breath sounds: Normal breath sounds.  Abdominal:     General: Bowel sounds are normal.     Palpations: Abdomen is soft. There is no hepatomegaly or splenomegaly.  Musculoskeletal: Normal range of motion.     Right lower leg: No edema.     Left lower leg: No edema.  Skin:    General: Skin is warm and dry.     Capillary Refill: Capillary refill takes less than 2 seconds.     Findings: No rash.  Neurological:     Mental Status: He is alert and oriented to person, place, and time.     Deep Tendon Reflexes: Reflexes are normal and symmetric.  Psychiatric:        Mood and Affect: Mood normal.        Behavior: Behavior normal.        Thought Content: Thought content normal.        Judgment: Judgment normal.     Results for orders placed or performed in visit on 11/03/18  Bayer DCA Hb A1c Waived (STAT)  Result Value Ref Range   HB A1C (BAYER DCA - WAIVED) 7.3 (H) <7.0 %  Lipid Panel Piccolo, Waived  Result Value Ref Range   Cholesterol Piccolo, Waived 111 <200 mg/dL   HDL Chol Piccolo, Waived 34 (L) >59 mg/dL   Triglycerides Piccolo,Waived 100 <150 mg/dL   Chol/HDL Ratio Piccolo,Waive 3.3 mg/dL   LDL Chol Calc Piccolo Waived 57 <100 mg/dL   VLDL Chol Calc Piccolo,Waive 20 <30 mg/dL      Assessment & Plan:   Problem List Items Addressed This Visit      Cardiovascular and Mediastinum   Benign hypertension with chronic kidney disease    Chronic, stable. Well controlled with current medication regimen. Continue medication as prescribed. Continue to monitor BP weekly. Follow-up in 3 months.         Endocrine    Diabetes mellitus without complication ([place]) - Primary    Chronic, ongoing. Discussed decreased control with recent medication changes. Recommended pt stop Rybelsus oral and start Ozempic injectable today. Provided sample of Ozempic with coupon card (to start at 0.25 MG weekly x 4 weeks and then increase to 0.5 MG weekly as he previously did). A1C today 5.7%. Continue metformin and Toujeo as ordered, with reduction in Toujeo dose based on FSBS findings with re initiation of injectable Ozempic. Continue to monitor BG daily. Foot exam due next visit. Discussed need for ophthalmology exam annually. Follow-up in 3 months.       Relevant Medications   Semaglutide,0.25 or 0.5MG/DOS, (OZEMPIC, 0.25  OR 0.5 MG/DOSE,) 2 MG/1.5ML SOPN   Other Relevant Orders   Bayer DCA Hb A1c Waived   Comp Met (CMET)     Other   Hypercholesterolemia    Chronic, stable on current regimen. Labs due next visit. Continue medication regimen as prescribed. Follow-up in 3 months.          NOTE WRITTEN BY [employer] DNP STUDENT.  ASSESSMENT AND PLAN OF CARE REVIEWED WITH STUDENT, AGREE WITH ABOVE FINDINGS AND PLAN.  Follow up plan: Return in about 3 months (around 05/04/2019) for Annual physical plus T2DM, HTN/HLD.

## 2019-02-03 NOTE — Assessment & Plan Note (Signed)
Chronic, stable on current regimen. Labs due next visit. Continue medication regimen as prescribed. Follow-up in 3 months.

## 2019-02-04 ENCOUNTER — Telehealth: Payer: Self-pay

## 2019-02-04 LAB — COMPREHENSIVE METABOLIC PANEL
ALK PHOS: 104 IU/L (ref 39–117)
ALT: 28 IU/L (ref 0–44)
AST: 33 IU/L (ref 0–40)
Albumin/Globulin Ratio: 2.4 — ABNORMAL HIGH (ref 1.2–2.2)
Albumin: 4.7 g/dL (ref 3.8–4.8)
BILIRUBIN TOTAL: 0.5 mg/dL (ref 0.0–1.2)
BUN/Creatinine Ratio: 18 (ref 10–24)
BUN: 20 mg/dL (ref 8–27)
CHLORIDE: 96 mmol/L (ref 96–106)
CO2: 25 mmol/L (ref 20–29)
Calcium: 9.5 mg/dL (ref 8.6–10.2)
Creatinine, Ser: 1.13 mg/dL (ref 0.76–1.27)
GFR calc non Af Amer: 69 mL/min/{1.73_m2} (ref >59)
GFR, EST AFRICAN AMERICAN: 80 mL/min/{1.73_m2} (ref >59)
GLUCOSE: 100 mg/dL — AB (ref 65–99)
Globulin, Total: 2 g/dL (ref 1.5–4.5)
Potassium: 4.6 mmol/L (ref 3.5–5.2)
Sodium: 139 mmol/L (ref 134–144)
TOTAL PROTEIN: 6.7 g/dL (ref 6.0–8.5)

## 2019-02-04 NOTE — Progress Notes (Signed)
Normal test results noted.  Please call patient and make them aware of normal results and will continue to monitor at regular visits.  Have a great day.  Look forward to seeing you at your next visit.

## 2019-02-04 NOTE — Telephone Encounter (Signed)
Prior authorization for Ozempic was initiated via [email or web address]. Key: [place] card was given to patient per provider yesterday.

## 2019-02-06 NOTE — Telephone Encounter (Signed)
At this time we will provide him Ozempic sample until his next visit.  He has one sample currently.  Then I will discuss with him trying two of the alternative options for 30 days.  Thanks.

## 2019-02-06 NOTE — Telephone Encounter (Signed)
Routing to [employer]

## 2019-02-06 NOTE — Telephone Encounter (Signed)
Prior authorization was denied due to unmet criteria 1 as outlined in plan guideline below. Please use two of the following formulary medication for at least 30 days each: Trulicity, Byetta, Victoza, Bydureon, Tanzeum.   Please advise.

## 2019-02-06 NOTE — Telephone Encounter (Signed)
Called pt and advised of Jolene's message. °

## 2019-02-15 ENCOUNTER — Other Ambulatory Visit: Payer: Self-pay | Admitting: Nurse Practitioner

## 2019-02-16 NOTE — Telephone Encounter (Signed)
Requested Prescriptions  Pending Prescriptions Disp Refills  . escitalopram (LEXAPRO) 10 MG tablet [Pharmacy Med Name: ESCITALOPRAM 10MG  TABLETS] 90 tablet 0    Sig: TAKE 1 TABLET BY MOUTH EVERY DAY     Psychiatry:  Antidepressants - SSRI Passed - 02/15/2019  4:30 PM      Passed - Valid encounter within last 6 months    Recent Outpatient Visits          1 week ago Diabetes mellitus without complication ([place])   [facility], Jolene T, NP   3 months ago Diabetes mellitus without complication ([place])   [place], Jolene T, NP   5 months ago Diabetes mellitus without complication ([place])   [place] Kathrine Haddock, NP   5 months ago Diabetes mellitus without complication ([place])   [facility] Kathrine Haddock, NP   5 months ago IFG (impaired fasting glucose)   [facility] Kathrine Haddock, NP      Future Appointments            In 2 months Cannady, Barbaraann Faster, NP [employer]

## 2019-02-19 ENCOUNTER — Other Ambulatory Visit: Payer: Self-pay | Admitting: Unknown Physician Specialty

## 2019-03-02 ENCOUNTER — Telehealth: Payer: Self-pay | Admitting: Nurse Practitioner

## 2019-03-02 MED ORDER — INSULIN DETEMIR 100 UNIT/ML FLEXPEN: 60.0000 [IU] | PEN_INJECTOR | Freq: Every day | SUBCUTANEOUS | 5 refills | Status: DC

## 2019-03-02 NOTE — Telephone Encounter (Signed)
Message relayed to patient. Verbalized understanding and denied questions.   

## 2019-03-02 NOTE — Telephone Encounter (Signed)
Copied from [place] [id number]. Topic: General - Other >> Mar 02, 2019 10:13 AM Leward Quan A wrote: Reason for CRM: Patient called to say that he contacted his pharmacy to get a  refill on Insulin Glargine (TOUJEO MAX SOLOSTAR) 300 UNIT/ML SOPN. Per pharmacy the insurance will not cover this medicine because it say this is not formulary. Please advise. Ph# [phone]

## 2019-03-21 ENCOUNTER — Other Ambulatory Visit: Payer: Self-pay | Admitting: Nurse Practitioner

## 2019-04-08 ENCOUNTER — Other Ambulatory Visit: Payer: Self-pay

## 2019-04-08 NOTE — Progress Notes (Unsigned)
Insurance only pays for #30 for quantity for pen needles.  Quantity changed and faxed back to pharmacy. Authorized by Merrie Roof, PA-C in absence of [facility].

## 2019-04-15 ENCOUNTER — Other Ambulatory Visit: Payer: Self-pay | Admitting: Nurse Practitioner

## 2019-05-02 ENCOUNTER — Other Ambulatory Visit: Payer: Self-pay | Admitting: Nurse Practitioner

## 2019-05-02 NOTE — Telephone Encounter (Signed)
Requested Prescriptions  Pending Prescriptions Disp Refills  . LEVEMIR FLEXTOUCH 100 UNIT/ML Pen [Pharmacy Med Name: LEVEMIR FLEX TOUCH PEN INJ 3ML] 3 mL 0    Sig: ADMINISTER 60 UNITS UNDER THE SKIN AT BEDTIME     Endocrinology:  Diabetes - Insulins Passed - 05/02/2019 11:27 AM      Passed - HBA1C is between 0 and 7.9 and within 180 days    HB A1C (BAYER DCA - WAIVED)  Date Value Ref Range Status  02/03/2019 5.7 <7.0 % Final    Comment:                                          Diabetic Adult            <7.0                                       Healthy Adult        4.3 - 5.7                                                           (DCCT/NGSP) American Diabetes Association's Summary of Glycemic Recommendations for Adults with Diabetes: Hemoglobin A1c <7.0%. More stringent glycemic goals (A1c <6.0%) may further reduce complications at the cost of increased risk of hypoglycemia.          Passed - Valid encounter within last 6 months    Recent Outpatient Visits          2 months ago Diabetes mellitus without complication ([place])   [facility] [place], Jolene T, NP   6 months ago Diabetes mellitus without complication ([place])   [place], Jolene T, NP   7 months ago Diabetes mellitus without complication ([place])   [place] Kathrine Haddock, NP   7 months ago Diabetes mellitus without complication ([place])   [facility] Kathrine Haddock, NP   8 months ago IFG (impaired fasting glucose)   [facility] Kathrine Haddock, NP      Future Appointments            In 3 days Cannady, Barbaraann Faster, NP [employer]

## 2019-05-05 ENCOUNTER — Encounter: Payer: Self-pay | Admitting: Nurse Practitioner

## 2019-05-05 ENCOUNTER — Ambulatory Visit (INDEPENDENT_AMBULATORY_CARE_PROVIDER_SITE_OTHER): Payer: PRIVATE HEALTH INSURANCE | Admitting: Nurse Practitioner

## 2019-05-05 ENCOUNTER — Other Ambulatory Visit: Payer: Self-pay

## 2019-05-05 VITALS — BP 114/78 | HR 75 | Wt 263.0 lb

## 2019-05-05 DIAGNOSIS — E1169 Type 2 diabetes mellitus with other specified complication: Secondary | ICD-10-CM | POA: Diagnosis not present

## 2019-05-05 DIAGNOSIS — F5101 Primary insomnia: Secondary | ICD-10-CM | POA: Insufficient documentation

## 2019-05-05 DIAGNOSIS — R0683 Snoring: Secondary | ICD-10-CM

## 2019-05-05 DIAGNOSIS — E1159 Type 2 diabetes mellitus with other circulatory complications: Secondary | ICD-10-CM | POA: Diagnosis not present

## 2019-05-05 DIAGNOSIS — E785 Hyperlipidemia, unspecified: Secondary | ICD-10-CM

## 2019-05-05 MED ORDER — TRAZODONE HCL 50 MG PO TABS: 25.0000 mg | ORAL_TABLET | Freq: Every day | ORAL | 2 refills | Status: DC

## 2019-05-05 MED ORDER — LISINOPRIL 2.5 MG PO TABS: ORAL_TABLET | ORAL | 2 refills | Status: DC

## 2019-05-05 MED ORDER — INSULIN DETEMIR 100 UNIT/ML FLEXPEN: PEN_INJECTOR | SUBCUTANEOUS | 1 refills | Status: DC

## 2019-05-05 MED ORDER — ESCITALOPRAM OXALATE 10 MG PO TABS: 10.0000 mg | ORAL_TABLET | Freq: Every day | ORAL | 0 refills | Status: DC

## 2019-05-05 NOTE — Assessment & Plan Note (Addendum)
Suspect sleep apnea, could not afford testing.  Will place CCM referral to determine assistance available to obtain testing.

## 2019-05-05 NOTE — Assessment & Plan Note (Signed)
Recommend continued focus on health diet choices and regular physical activity (30 minutes 5 days a week). 

## 2019-05-05 NOTE — Addendum Note (Signed)
Addended by: [name] T on: 05/05/2019 09:10 AM   Modules accepted: Orders

## 2019-05-05 NOTE — Assessment & Plan Note (Addendum)
Chronic, ongoing with HTN and h/o open heart surgery.  Last A1C below goal will order outpatient labs to review current A1C and adjust medications as needed.  Continue current medication regimen at this time.  CCM referral to determine assistance available as poor adherence to regimen is due to ability to afford.  Possibly switch to Trulicity if assistance available.  BP often below goal at home, continue current BP meds.  Follow-up in 3 months.

## 2019-05-05 NOTE — Assessment & Plan Note (Signed)
Chronic, ongoing.  Continue current medication regimen.  Obtain outpatient labs and adjust medication as needed.  Return in 3 months.

## 2019-05-05 NOTE — Assessment & Plan Note (Signed)
Possibly related to sleep apnea, but can not afford testing.  Will place CCM referral for further assistance.  At this time will trial reduction of Lexapro over next 2 weeks to discontinue and add Trazodone 25 MG QHS which will benefit both mood and sleep pattern.  Return in 4 weeks for follow-up.

## 2019-05-05 NOTE — Progress Notes (Signed)
BP 114/78    Pulse 75    Wt 263 lb (119.3 kg)    BMI 36.68 kg/m    Subjective:    Patient ID: Brian Frederick, male    DOB: 1956-08-17, 63 y.o.   MRN: [id number]  HPI: Brian Frederick is a 63 y.o. male  Chief Complaint  Patient presents with   Medication Refill    lisinopril levemir and lexapro     This visit was completed via Doximity due to the restrictions of the COVID-19 pandemic. All issues as above were discussed and addressed. Physical exam was done as above through visual confirmation on Doximity. If it was felt that the patient should be evaluated in the office, they were directed there. The patient verbally consented to this visit.  Location of the patient: home  Location of the provider: home  Those involved with this call:   Provider: [name], DNP  CMA: Tiffany Reel, CMA  Front Desk/Registration: Linard Millers   Time spent on call: 20 minutes with patient face to face via video conference. More than 50% of this time was spent in counseling and coordination of care. 10 minutes total spent in review of patient's record and preparation of their chart. I verified patient identity using two factors (patient name and date of birth). Patient consents verbally to being seen via telemedicine visit today.    DIABETES A1C in February was 5.7%, which trended down from previous of 7.3%.  Continues in Levemir, Ozempic, and Metformin.  Currently taking Levemir 35 units.  He has been sparsely using Ozempic, when uses he injects 0.25 MG every Wednesday.  Has not increased dose due to making pen last.  Endorses difficulty obtaining medications due to cost.  Discussed CCM team with him and he is interested in referral. Hypoglycemic episodes:no Polydipsia/polyuria: no Visual disturbance: no Chest pain: no Paresthesias: no Glucose Monitoring: yes  Accucheck frequency: Daily  Fasting glucose:  108 this morning, 103 to 135  Post prandial:  Evening:  Before meals: Taking  Insulin?: yes  Long acting insulin: Levemir 35 units  Short acting insulin: Blood Pressure Monitoring: rarely Retinal Examination: Not up to Date Foot Exam: Not up to Date Pneumovax: Up to Date Influenza: Up to Date Aspirin: yes   HYPERTENSION / HYPERLIPIDEMIA Taking Atorvastatin 89m for HLD and lisinopril 2.545mand metoprolol-XL 10010maily for HTN. Satisfied with current treatment? yes Duration of hypertension: chronic BP monitoring frequency: rarely BP range: 120-130/80, with occasional SBP in 140 range BP medication side effects: no Duration of hyperlipidemia: chronic Cholesterol medication side effects: no Cholesterol supplements: none Medication compliance: good compliance Aspirin: yes Recent stressors: no Recurrent headaches: no Visual changes: no Palpitations: no Dyspnea: no Chest pain: no Lower extremity edema: no Dizzy/lightheaded: no  INSOMNIA Is retired, used to have trouble sleeping when working due to busy profession but that improved for awhGoodrich CorporationNow over past few months has had issues sleeping again, wakes up a few hours after falling asleep.  He does endorse snoring, but was unable to afford sleep apnea testing.  Takes Lexapro 10 MG for mood, but reports he feels he could come off this as mood has improved with retirement.  Discussed addition of Trazodone and tapering off Lexapro. Educated on benefit of Trazodone for both mood and sleep.  Discussed alternate prescription medications for sleep and risk/benefits of all.  He has tried Melatonin and Tylenol PM without benefit. Duration: months == 3 months Satisfied with sleep quality: yes Difficulty falling  asleep: no Difficulty staying asleep: yes Waking a few hours after sleep onset: yes, 4 hours after sleeps, then watches television for awhile and goes back to sleep Early morning awakenings: no Daytime hypersomnolence: no Wakes feeling refreshed: no Good sleep hygiene: no Apnea: no Snoring:  no Depressed/anxious mood: no Recent stress: no Restless legs/nocturnal leg cramps: no Chronic pain/arthritis: no History of sleep study: was ordered, but could not afford and insurance would not cover Treatments attempted: melatonin and benadryl   Relevant past medical, surgical, family and social history reviewed and updated as indicated. Interim medical history since our last visit reviewed. Allergies and medications reviewed and updated.  Review of Systems  Constitutional: Negative for activity change, diaphoresis, fatigue and fever.  Respiratory: Negative for cough, chest tightness, shortness of breath and wheezing.   Cardiovascular: Negative for chest pain, palpitations and leg swelling.  Gastrointestinal: Negative for abdominal distention, abdominal pain, constipation, diarrhea, nausea and vomiting.  Endocrine: Negative for cold intolerance, heat intolerance, polydipsia, polyphagia and polyuria.  Musculoskeletal: Negative.   Skin: Negative.   Neurological: Negative for dizziness, syncope, weakness, light-headedness, numbness and headaches.  Psychiatric/Behavioral: Positive for sleep disturbance.    Per HPI unless specifically indicated above     Objective:    BP 114/78    Pulse 75    Wt 263 lb (119.3 kg)    BMI 36.68 kg/m   Wt Readings from Last 3 Encounters:  05/05/19 263 lb (119.3 kg)  02/03/19 261 lb 8 oz (118.6 kg)  01/06/19 263 lb (119.3 kg)    Physical Exam Vitals signs and nursing note reviewed.  Constitutional:      General: He is awake. He is not in acute distress.    Appearance: He is well-developed. He is obese. He is not ill-appearing.  HENT:     Head: Normocephalic.     Right Ear: Hearing normal. No drainage.     Left Ear: Hearing normal. No drainage.  Eyes:     General: Lids are normal.        Right eye: No discharge.        Left eye: No discharge.     Conjunctiva/sclera: Conjunctivae normal.  Neck:     Musculoskeletal: Normal range of motion.   Cardiovascular:     Comments: Unable to auscultate due to virtual exam only Pulmonary:     Effort: Pulmonary effort is normal. No accessory muscle usage or respiratory distress.     Comments: Unable to auscultate due to virtual exam only Neurological:     Mental Status: He is alert and oriented to person, place, and time.  Psychiatric:        Mood and Affect: Mood normal.        Behavior: Behavior normal. Behavior is cooperative.        Thought Content: Thought content normal.        Judgment: Judgment normal.     Results for orders placed or performed in visit on 02/03/19  Bayer DCA Hb A1c Waived  Result Value Ref Range   HB A1C (BAYER DCA - WAIVED) 5.7 <7.0 %  Comp Met (CMET)  Result Value Ref Range   Glucose 100 (H) 65 - 99 mg/dL   BUN 20 8 - 27 mg/dL   Creatinine, Ser 1.13 0.76 - 1.27 mg/dL   GFR calc non Af Amer 69 >59 mL/min/1.73   GFR calc Af Amer 80 >59 mL/min/1.73   BUN/Creatinine Ratio 18 10 - 24   Sodium 139 134 -  144 mmol/L   Potassium 4.6 3.5 - 5.2 mmol/L   Chloride 96 96 - 106 mmol/L   CO2 25 20 - 29 mmol/L   Calcium 9.5 8.6 - 10.2 mg/dL   Total Protein 6.7 6.0 - 8.5 g/dL   Albumin 4.7 3.8 - 4.8 g/dL   Globulin, Total 2.0 1.5 - 4.5 g/dL   Albumin/Globulin Ratio 2.4 (H) 1.2 - 2.2   Bilirubin Total 0.5 0.0 - 1.2 mg/dL   Alkaline Phosphatase 104 39 - 117 IU/L   AST 33 0 - 40 IU/L   ALT 28 0 - 44 IU/L      Assessment & Plan:   Problem List Items Addressed This Visit      Cardiovascular and Mediastinum   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H[facility]) - Primary    Chronic, ongoing with HTN and h/o open heart surgery.  Last A1C below goal will order outpatient labs to review current A1C and adjust medications as needed.  Continue current medication regimen at this time.  CCM referral to determine assistance available as poor adherence to regimen is due to ability to afford.  Possibly switch to Trulicity if assistance available.  BP often below goal  at home, continue current BP meds.  Follow-up in 3 months.      Relevant Orders   Bayer DCA Hb A1c Waived   Basic Metabolic Panel (BMET)   Ambulatory referral to Chronic Care Management Services     Endocrine   Hyperlipidemia associated with type 2 diabetes mellitus (HCC)    Chronic, ongoing.  Continue current medication regimen.  Obtain outpatient labs and adjust medication as needed.  Return in 3 months.      Relevant Orders   Lipid Panel Piccolo, Waived     Other   Morbid obesity ([place])    Recommend continued focus on health diet choices and regular physical activity (30 minutes 5 days a week).      Snoring    Suspect sleep apnea, could not afford testing.  Will place CCM referral to determine assistance available to obtain testing.      Primary insomnia    Possibly related to sleep apnea, but can not afford testing.  Will place CCM referral for further assistance.  At this time will trial reduction of Lexapro over next 2 weeks to discontinue and add Trazodone 25 MG QHS which will benefit both mood and sleep pattern.  Return in 4 weeks for follow-up.      Relevant Orders   Ambulatory referral to Chronic Care Management Services      I discussed the assessment and treatment plan with the patient. The patient was provided an opportunity to ask questions and all were answered. The patient agreed with the plan and demonstrated an understanding of the instructions.   The patient was advised to call back or seek an in-person evaluation if the symptoms worsen or if the condition fails to improve as anticipated.   I provided 21+ minutes of time during this encounter.  Follow up plan: Return in about 4 weeks (around 06/02/2019) for Insomnia.

## 2019-05-05 NOTE — Patient Instructions (Signed)
Carbohydrate Counting for Diabetes Mellitus, Adult  Carbohydrate counting is a method of keeping track of how many carbohydrates you eat. Eating carbohydrates naturally increases the amount of sugar (glucose) in the blood. Counting how many carbohydrates you eat helps keep your blood glucose within normal limits, which helps you manage your diabetes (diabetes mellitus). It is important to know how many carbohydrates you can safely have in each meal. This is different for every person. A diet and nutrition specialist (registered dietitian) can help you make a meal plan and calculate how many carbohydrates you should have at each meal and snack. Carbohydrates are found in the following foods:  Grains, such as breads and cereals.  Dried beans and soy products.  Starchy vegetables, such as potatoes, peas, and corn.  Fruit and fruit juices.  Milk and yogurt.  Sweets and snack foods, such as cake, cookies, candy, chips, and soft drinks. How do I count carbohydrates? There are two ways to count carbohydrates in food. You can use either of the methods or a combination of both. Reading "Nutrition Facts" on packaged food The "Nutrition Facts" list is included on the labels of almost all packaged foods and beverages in the [place] It includes:  The serving size.  Information about nutrients in each serving, including the grams (g) of carbohydrate per serving. To use the "Nutrition Facts":  Decide how many servings you will have.  Multiply the number of servings by the number of carbohydrates per serving.  The resulting number is the total amount of carbohydrates that you will be having. Learning standard serving sizes of other foods When you eat carbohydrate foods that are not packaged or do not include "Nutrition Facts" on the label, you need to measure the servings in order to count the amount of carbohydrates:  Measure the foods that you will eat with a food scale or measuring cup, if needed.   Decide how many standard-size servings you will eat.  Multiply the number of servings by 15. Most carbohydrate-rich foods have about 15 g of carbohydrates per serving. ? For example, if you eat 8 oz (170 g) of strawberries, you will have eaten 2 servings and 30 g of carbohydrates (2 servings x 15 g = 30 g).  For foods that have more than one food mixed, such as soups and casseroles, you must count the carbohydrates in each food that is included. The following list contains standard serving sizes of common carbohydrate-rich foods. Each of these servings has about 15 g of carbohydrates:   hamburger bun or  English muffin.   oz (15 mL) syrup.   oz (14 g) jelly.  1 slice of bread.  1 six-inch tortilla.  3 oz (85 g) cooked rice or pasta.  4 oz (113 g) cooked dried beans.  4 oz (113 g) starchy vegetable, such as peas, corn, or potatoes.  4 oz (113 g) hot cereal.  4 oz (113 g) mashed potatoes or  of a large baked potato.  4 oz (113 g) canned or frozen fruit.  4 oz (120 mL) fruit juice.  4-6 crackers.  6 chicken nuggets.  6 oz (170 g) unsweetened dry cereal.  6 oz (170 g) plain fat-free yogurt or yogurt sweetened with artificial sweeteners.  8 oz (240 mL) milk.  8 oz (170 g) fresh fruit or one small piece of fruit.  24 oz (680 g) popped popcorn. Example of carbohydrate counting Sample meal  3 oz (85 g) chicken breast.  6 oz (170 g)   brown rice.  4 oz (113 g) corn.  8 oz (240 mL) milk.  8 oz (170 g) strawberries with sugar-free whipped topping. Carbohydrate calculation 1. Identify the foods that contain carbohydrates: ? Rice. ? Corn. ? Milk. ? Strawberries. 2. Calculate how many servings you have of each food: ? 2 servings rice. ? 1 serving corn. ? 1 serving milk. ? 1 serving strawberries. 3. Multiply each number of servings by 15 g: ? 2 servings rice x 15 g = 30 g. ? 1 serving corn x 15 g = 15 g. ? 1 serving milk x 15 g = 15 g. ? 1 serving  strawberries x 15 g = 15 g. 4. Add together all of the amounts to find the total grams of carbohydrates eaten: ? 30 g + 15 g + 15 g + 15 g = 75 g of carbohydrates total. Summary  Carbohydrate counting is a method of keeping track of how many carbohydrates you eat.  Eating carbohydrates naturally increases the amount of sugar (glucose) in the blood.  Counting how many carbohydrates you eat helps keep your blood glucose within normal limits, which helps you manage your diabetes.  A diet and nutrition specialist (registered dietitian) can help you make a meal plan and calculate how many carbohydrates you should have at each meal and snack. This information is not intended to replace advice given to you by your health care provider. Make sure you discuss any questions you have with your health care provider. Document Released: 12/03/2005 Document Revised: 06/12/2017 Document Reviewed: 05/16/2016 Elsevier Interactive Patient Education  2019 Elsevier Inc.  

## 2019-05-09 ENCOUNTER — Other Ambulatory Visit: Payer: Self-pay | Admitting: Nurse Practitioner

## 2019-05-09 NOTE — Telephone Encounter (Signed)
Requested Prescriptions  Pending Prescriptions Disp Refills  . metFORMIN (GLUCOPHAGE) 500 MG tablet [Pharmacy Med Name: METFORMIN 500MG TABLETS] 120 tablet 0    Sig: TAKE 2 TABLETS BY MOUTH TWICE DAILY WITH FOOD     Endocrinology:  Diabetes - Biguanides Passed - 05/09/2019  4:03 PM      Passed - Cr in normal range and within 360 days    Creatinine  Date Value Ref Range Status  09/02/2013 1.52 (H) 0.60 - 1.30 mg/dL Final   Creatinine, Ser  Date Value Ref Range Status  02/03/2019 1.13 0.76 - 1.27 mg/dL Final         Passed - HBA1C is between 0 and 7.9 and within 180 days    HB A1C (BAYER DCA - WAIVED)  Date Value Ref Range Status  02/03/2019 5.7 <7.0 % Final    Comment:                                          Diabetic Adult            <7.0                                       Healthy Adult        4.3 - 5.7                                                           (DCCT/NGSP) American Diabetes Association's Summary of Glycemic Recommendations for Adults with Diabetes: Hemoglobin A1c <7.0%. More stringent glycemic goals (A1c <6.0%) may further reduce complications at the cost of increased risk of hypoglycemia.          Passed - eGFR in normal range and within 360 days    EGFR (African American)  Date Value Ref Range Status  09/02/2013 58 (L)  Final   GFR calc Af Amer  Date Value Ref Range Status  02/03/2019 80 >59 mL/min/1.73 Final   EGFR (Non-African Amer.)  Date Value Ref Range Status  09/02/2013 50 (L)  Final    Comment:    eGFR values <69m/min/1.73 m2 may be an indication of chronic kidney disease (CKD). Calculated eGFR is useful in patients with stable renal function. The eGFR calculation will not be reliable in acutely ill patients when serum creatinine is changing rapidly. It is not useful in  patients on dialysis. The eGFR calculation may not be applicable to patients at the low and high extremes of body sizes, pregnant women, and vegetarians.    GFR calc  non Af Amer  Date Value Ref Range Status  02/03/2019 69 >59 mL/min/1.73 Final         Passed - Valid encounter within last 6 months    Recent Outpatient Visits          4 days ago 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[place] T, [place]   3 months ago Diabetes mellitus without complication ([place]   [place]   6 months ago Diabetes mellitus without complication ([place]   [place]  [name] T, NP   7 months ago Diabetes mellitus without complication ([place])   [facility] Kathrine Haddock, NP   8 months ago Diabetes mellitus without complication [facility])   [place] Kathrine Haddock, NP      Future Appointments            In 3 weeks Cannady, Barbaraann Faster, NP [employer]

## 2019-05-12 ENCOUNTER — Other Ambulatory Visit: Payer: PRIVATE HEALTH INSURANCE

## 2019-05-12 ENCOUNTER — Telehealth: Payer: Self-pay

## 2019-05-12 ENCOUNTER — Other Ambulatory Visit: Payer: Self-pay

## 2019-05-12 DIAGNOSIS — E1169 Type 2 diabetes mellitus with other specified complication: Secondary | ICD-10-CM

## 2019-05-12 DIAGNOSIS — E1159 Type 2 diabetes mellitus with other circulatory complications: Secondary | ICD-10-CM

## 2019-05-12 LAB — BAYER DCA HB A1C WAIVED: HB A1C (BAYER DCA - WAIVED): 6.1 % (ref <7.0)

## 2019-05-12 LAB — LIPID PANEL PICCOLO, WAIVED
Chol/HDL Ratio Piccolo,Waive: 3.4 mg/dL
Cholesterol Piccolo, Waived: 123 mg/dL (ref <200)
HDL Chol Piccolo, Waived: 36 mg/dL — ABNORMAL LOW (ref >59)
LDL Chol Calc Piccolo Waived: 52 mg/dL (ref <100)
Triglycerides Piccolo,Waived: 175 mg/dL — ABNORMAL HIGH (ref <150)
VLDL Chol Calc Piccolo,Waive: 35 mg/dL — ABNORMAL HIGH (ref <30)

## 2019-05-12 NOTE — Telephone Encounter (Signed)
Called patient from recall.  No answer. LMOV.  This is the second attempt per recall list.

## 2019-05-13 ENCOUNTER — Other Ambulatory Visit: Payer: Self-pay | Admitting: Nurse Practitioner

## 2019-05-13 ENCOUNTER — Ambulatory Visit: Payer: Self-pay | Admitting: Pharmacist

## 2019-05-13 DIAGNOSIS — E1159 Type 2 diabetes mellitus with other circulatory complications: Secondary | ICD-10-CM

## 2019-05-13 LAB — BASIC METABOLIC PANEL
BUN/Creatinine Ratio: 18 (ref 10–24)
BUN: 19 mg/dL (ref 8–27)
CO2: 23 mmol/L (ref 20–29)
Calcium: 9.1 mg/dL (ref 8.6–10.2)
Chloride: 103 mmol/L (ref 96–106)
Creatinine, Ser: 1.06 mg/dL (ref 0.76–1.27)
GFR calc Af Amer: 86 mL/min/{1.73_m2} (ref >59)
GFR calc non Af Amer: 74 mL/min/{1.73_m2} (ref >59)
Glucose: 95 mg/dL (ref 65–99)
Potassium: 4.4 mmol/L (ref 3.5–5.2)
Sodium: 142 mmol/L (ref 134–144)

## 2019-05-13 MED ORDER — METFORMIN HCL ER 500 MG PO TB24: 1000.0000 mg | ORAL_TABLET | Freq: Two times a day (BID) | ORAL | 2 refills | Status: DC

## 2019-05-13 MED ORDER — DULAGLUTIDE 0.75 MG/0.5ML ~~LOC~~ SOAJ: 0.7500 mg | SUBCUTANEOUS | 3 refills | Status: DC

## 2019-05-13 NOTE — Patient Instructions (Signed)
Visit Information  Goals Addressed            This Visit's Progress     Patient Stated   . "I can't afford my diabetes medications" (pt-stated)       Current Barriers:  . Financial Barriers- patient changed from a commercial BCBS plan to commercial Ambetter plan this calendar year, and has had barriers in receiving medications due to formulary preferences . He had been maintained on Ozempic w/ samples, but clinic is out of samples currently. Per PA note on 06/18/5008, Trulicity and Victoza were preferred over Ozempic . Patient previously managed on Toujeo, but switched to Levemir due to formulary preference. Levemir is often most effective when dosed BID. Appears Basaglar is also preferred via electronic medical record ordering  . Reports well controlled blood sugars at this time: 100s-120s fasting. Most recent A1c 6.1%. . Patient also notes that to control GI upset/diarrhea, he takes metformin 1000 mg BID w/ a probiotic, and this generally prevents problems. Has never tried metformin ER  Pharmacist Clinical Goal(s):  [name] Kitchen Over the next 90 days, patient will work with primary care provider and PharmD to address needs related to optimized diabetic medication management  Interventions: . Comprehensive medication review performed.  . Reviewed lab results with patient, praised for continued A1c control . Discussed mechanism of action of metformin, GLP1, and insulins, as well as long term benefits. Explained that goal would be to maximize GLP1 medication for weight loss/ASCVD risk reduction benefit and minimize insulin need . Recommend d/c Ozempic and order Trulicity 3.81 mg weekly. Encouraged patient to download Trulicity coupon card available on the manufacturer website and use in combination with insurance.  . Recommend d/c metformin IR and order metformin XR 500 mg, 2 tab BID. Patient counseled that he can complete current supply before switching, but that hopefully XR formulation would prevent  need for daily probiotic  . Would recommend reducing Levemir to 25 units daily with concurrent GLP1 start to reduce risk of hypoglycemia. In the future, could consider changing to [place], if insulin is still needed once GLP1 is maximized.   Patient Self Care Activities:  . Self administers medications as prescribed . Calls provider office for new concerns or questions  . Checks fasting blood sugar daily as directed  Initial goal documentation        The patient verbalized understanding of instructions provided today and declined a print copy of patient instruction materials.    Plan: - Will collaborate with RN CM Janci Minor for any support with OSA testing financial burden - Will collaborate with PCP on medication management recommendations above  Catie Darnelle Maffucci, PharmD Clinical Pharmacist [place] [phone]

## 2019-05-13 NOTE — Progress Notes (Signed)
[facility] CCM pharmacist note 05/13/2019 for changes made.

## 2019-05-13 NOTE — Chronic Care Management (AMB) (Signed)
Chronic Care Management   Note  05/13/2019 Name: Brian Frederick MRN: [id number] DOB: [birth year]   Subjective:  Brian Frederick is a 63 y.o. year old male who is a primary care patient of Cannady, Barbaraann Faster, NP. The CCM team was consulted for assistance with chronic disease management and care coordination needs.    Contacted patient telephonically for CCM enrollment, medication review, and diabetes review.  Mr. Lasser was given information about Chronic Care Management services today including:  1. CCM service includes personalized support from designated clinical staff supervised by his physician, including individualized plan of care and coordination with other care providers 2. 24/7 contact phone numbers for assistance for urgent and routine care needs. 3. Service will only be billed when office clinical staff spend 20 minutes or more in a month to coordinate care. 4. Only one practitioner may furnish and bill the service in a calendar month. 5. The patient may stop CCM services at any time (effective at the end of the month) by phone call to the office staff. 6. The patient will be responsible for cost sharing (co-pay) of up to 20% of the service fee (after annual deductible is met).  Patient agreed to services and verbal consent obtained.   Review of patient status, including review of consultants reports, laboratory and other test data, was performed as part of comprehensive evaluation and provision of chronic care management services.   Objective:  Lab Results  Component Value Date   CREATININE 1.06 05/12/2019   CREATININE 1.13 02/03/2019   CREATININE 0.87 09/05/2018    Lab Results  Component Value Date   HGBA1C 6.1 05/12/2019       Component Value Date/Time   CHOL 123 05/12/2019 0830   TRIG 175 (H) 05/12/2019 0830   HDL 35 (L) 05/02/2018 0841   VLDL 35 (H) 05/12/2019 0830   LDLCALC 81 05/02/2018 0841   BP Readings from Last 3 Encounters:  05/05/19 114/78    02/03/19 130/76  01/06/19 128/76    Allergies  Allergen Reactions   Amoxil [Amoxicillin] Swelling   Penicillin G Benzathine Swelling    Medications Reviewed Today    Reviewed by De Hollingshead, [facility] (Pharmacist) on 05/13/19 at 1510  Med List Status: <None>  Medication Order Taking? Sig Documenting Provider Last Dose Status Informant  aspirin 81 MG tablet [id number] Yes Take 81 mg by mouth daily. [provider] Taking Active   atorvastatin (LIPITOR) 20 MG tablet [id number] Yes take 1 tablet by mouth once daily Kathrine Haddock, NP Taking Active   blood glucose meter kit and supplies KIT [id number] Yes Dispense based on patient and insurance preference. Use up to four times daily as directed. (FOR ICD-9 250.00, 250.01). [name] T, NP Taking Active   escitalopram (LEXAPRO) 10 MG tablet [id number] Yes Take 1 tablet (10 mg total) by mouth daily. [name] T, NP Taking Active   Insulin Detemir (LEVEMIR FLEXTOUCH) 100 UNIT/ML Pen [id number] Yes ADMINISTER 35 UNITS UNDER THE SKIN AT BEDTIME Cannady, Barbaraann Faster, NP Taking Active            Med Note De Hollingshead   Wed May 13, 2019  3:06 PM) 36  Insulin Pen Needle 32G X 4 MM MISC [id number] Yes 1 Units by Does not apply route every morning. Summary: 1 Units by Does not apply route every morning. Pen needles, Starting Fri 09/05/2018, Normal [provider] Taking Active            Med  Note (BULLOCK, KERI L   Wed Apr 08, 2019 11:55 AM) Insurance only pays for #30 for quantity.    lansoprazole (PREVACID) 30 MG capsule [id number] Yes TAKE 1 CAPSULE BY MOUTH EVERY DAY Cannady, Jolene T, NP Taking Active   lisinopril (ZESTRIL) 2.5 MG tablet [id number] Yes TAKE 1 TABLET(2.5 MG) BY MOUTH DAILY Cannady, Jolene T, NP Taking Active   meloxicam (MOBIC) 15 MG tablet [id number] Yes Take 1 tablet (15 mg total) by mouth daily. Kathrine Haddock, NP Taking Active            Med Note De Hollingshead   Wed May 13, 2019  3:07 PM)  PRN  metFORMIN (GLUCOPHAGE) 500 MG tablet [id number] Yes TAKE 2 TABLETS BY MOUTH TWICE DAILY WITH FOOD Cannady, Jolene T, NP Taking Active   metoprolol succinate (TOPROL-XL) 100 MG 24 hr tablet [id number] Yes Take 1 tablet (100 mg total) by mouth daily. Take with or immediately following a meal. Kathrine Haddock, NP Taking Active   pramipexole (MIRAPEX) 1 MG tablet [id number] Yes Take 1 tablet (1 mg total) by mouth 3 (three) times daily. Ludwig Clarks, DO Taking Active   Semaglutide,0.25 or 0.5MG/DOS, (OZEMPIC, 0.25 OR 0.5 MG/DOSE,) 2 MG/1.5ML SOPN [id number] No Inject 0.25 mg into the skin once a week. Start with 0.25MG once a week x 4 weeks, then increase to 0.5MG weekly.  Patient not taking:  Reported on 05/13/2019   [name] T, NP Not Taking Active   Tadalafil (CIALIS) 2.5 MG TABS [id number] Yes Take 1 tablet (2.5 mg total) daily by mouth. Kathrine Haddock, NP Taking Active   traZODone (DESYREL) 50 MG tablet [id number] Yes Take 0.5 tablets (25 mg total) by mouth at bedtime. [name], NP Taking Active   Med List Note (Reel, Rexene Edison, [facility] 01/15/19 1122):  500         Assessment:   Goals Addressed            This Visit's Progress     Patient Stated    "I can't afford my diabetes medications" (pt-stated)       Current Barriers:   Financial Barriers- patient changed from a commercial BCBS plan to commercial Ambetter plan this calendar year, and has had barriers in receiving medications due to formulary preferences  He had been maintained on Ozempic w/ samples, but clinic is out of samples currently. Per PA note on 8/88/2800, Trulicity and Victoza were preferred over Ozempic  Patient previously managed on Toujeo, but switched to Levemir due to formulary preference. Levemir is often most effective when dosed BID. Appears Basaglar is also preferred via electronic medical record ordering   Reports well controlled blood sugars at this time: 100s-120s fasting. Most recent A1c  6.1%.  Patient also notes that to control GI upset/diarrhea, he takes metformin 1000 mg BID w/ a probiotic, and this generally prevents problems. Has never tried metformin ER  Pharmacist Clinical Goal(s):   Over the next 90 days, patient will work with primary care provider and PharmD to address needs related to optimized diabetic medication management  Interventions:  Comprehensive medication review performed.   Reviewed lab results with patient, praised for continued A1c control  Discussed mechanism of action of metformin, GLP1, and insulins, as well as long term benefits. Explained that goal would be to maximize GLP1 medication for weight loss/ASCVD risk reduction benefit and minimize insulin need  Recommend d/c Ozempic and order Trulicity 3.49 mg weekly. Encouraged patient to download Trulicity coupon card available on  the manufacturer website and use in combination with insurance.   Recommend d/c metformin IR and order metformin XR 500 mg, 2 tab BID. Patient counseled that he can complete current supply before switching, but that hopefully XR formulation would prevent need for daily probiotic   Would recommend reducing Levemir to 25 units daily with concurrent GLP1 start to reduce risk of hypoglycemia. In the future, could consider changing to [place], if insulin is still needed once GLP1 is maximized.   Patient Self Care Activities:   Self administers medications as prescribed  Calls provider office for new concerns or questions   Checks fasting blood sugar daily as directed  Initial goal documentation        Plan: - Will collaborate with RN CM Janci Minor for any support with OSA testing financial burden - Will collaborate with PCP on medication management recommendations above  Catie Darnelle Maffucci, PharmD Clinical Pharmacist [place] [phone]

## 2019-05-14 ENCOUNTER — Telehealth: Payer: Self-pay | Admitting: Nurse Practitioner

## 2019-05-14 NOTE — Telephone Encounter (Signed)
Copied from [place] [id number]. Topic: Quick Communication - Rx Refill/Question >> May 14, 2019  8:36 AM Erick Blinks wrote: Medication: metFORMIN (GLUCOPHAGE-XR) 500 MG 24 hr tablet -Brian Frederick from [facility] is requesting call back to verify which prescripition is correct. Pt has been prescribed XR tablet instead of regular tablet like he has taken previously. Brian Frederick wants to verify if this is the correct prescription? Please advise.   Best Contact: [phone]  Has the patient contacted their pharmacy? No  (Agent: If no, request that the patient contact the pharmacy for the refill.) (Agent: If yes, when and what did the pharmacy advise?)  Preferred Pharmacy (with phone number or street name): [place], [place] AT [facility] [address] [place] [address] Phone: [phone] Fax: [phone]    Agent: Please be advised that RX refills may take up to 3 business days. We ask that you follow-up with your pharmacy.

## 2019-05-14 NOTE — Telephone Encounter (Signed)
PA submitted, awaiting approval or denial.

## 2019-05-14 NOTE — Telephone Encounter (Signed)
Contacted pharmacy, confirmed that metformin was intended to be switched to extended release formulation.   Also inquired if Trulicity was covered; he noted that a PA was required. Forwarding to clinical staff for help with this.   Catie Darnelle Maffucci, PharmD, [place] PGY2 Ambulatory Care Pharmacy Resident, [place] [facility] Phone: [phone]

## 2019-05-15 ENCOUNTER — Telehealth: Payer: Self-pay

## 2019-05-29 ENCOUNTER — Ambulatory Visit: Payer: Self-pay | Admitting: *Deleted

## 2019-05-29 DIAGNOSIS — E1159 Type 2 diabetes mellitus with other circulatory complications: Secondary | ICD-10-CM

## 2019-05-29 DIAGNOSIS — R0683 Snoring: Secondary | ICD-10-CM

## 2019-05-29 NOTE — Chronic Care Management (AMB) (Signed)
  Chronic Care Management   Follow Up Note   05/29/2019 Name: Brian Frederick MRN: [id number] DOB: 1956/12/13  Referred by: [name], NP Reason for referral : Chronic Care Management (DM ) and Care Coordination (Sleep apnea)   Brian Frederick is a 63 y.o. year old male who is a primary care patient of Cannady, Barbaraann Faster, NP. The CCM team was consulted for assistance with chronic disease management and care coordination needs.    Review of patient status, including review of consultants reports, relevant laboratory and other test results, and collaboration with appropriate care team members and the patient's provider was performed as part of comprehensive patient evaluation and provision of chronic care management services.    Goals Addressed            This Visit's Progress   . I can't afford a sleep study (pt-stated)       Current Barriers:  . 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   Interventions:  . Collaborated with [facility] regarding patient's current cost restraints. Spoke with Wells Guiles who stated their sleep center is out of network for [employer]. Wells Guiles states OOP cost for in lab sleep study would be 750$ and an in home study would be 250$. This would not include the cpap or the supplies if patient is found to have OSA. [name] Kitchen Discussed plans with patient for ongoing care management follow up and provided patient with direct contact information for care management team . Will plan to reach back out to patient and see if he would like to contact his insurance for in network provider and go from there.   Patient Self Care Activities:  . Currently UNABLE TO independently to afford sleep study at out of network sleep  lab  Initial goal documentation         The care management team will reach out to the patient again over the next 14 days.  The patient has been provided with contact information for the care management team and has been advised to call with any health related questions or concerns.    Merlene Morse Celest Reitz RN, BSN Nurse Case Editor, [facility]ing [facility] Care Management  [phone]) Business Mobile

## 2019-05-30 NOTE — Patient Instructions (Signed)
Thank you allowing the Chronic Care Management Team to be a part of your care! It was a pleasure speaking with you today!   CCM (Chronic Care Management) Team   Crickett Abbett RN, BSN Nurse Care Coordinator  [phone]  [facility] PharmD  Clinical Pharmacist  [phone]  Eula Fried LCSW Clinical Social Worker [phone]  Goals Addressed            This Visit's Progress   . I can't afford a sleep study (pt-stated)       Current Barriers:  . 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   Interventions:  . Collaborated with [facility] regarding patient's current cost restraints. Spoke with Wells Guiles who stated their sleep center is out of network for [employer]. Wells Guiles states OOP cost for in lab sleep study would be 750$ and an in home study would be 250$. This would not include the cpap or the supplies if patient is found to have OSA. [name] Kitchen Discussed plans with patient for ongoing care management follow up and provided patient with direct contact information for care management team . Will plan to reach back out to patient and see if he would like to contact his insurance for in network provider and go from there.   Patient Self Care Activities:  . Currently UNABLE TO independently to afford sleep study at out of network sleep lab  Initial goal documentation        The patient verbalized understanding of instructions provided today and declined a print copy of patient instruction materials.   The care management team will reach out to the patient again over the next 14 days.  The patient has been provided with contact information for the care management team and has been advised to call with any health related  questions or concerns.

## 2019-05-31 ENCOUNTER — Other Ambulatory Visit: Payer: Self-pay | Admitting: Nurse Practitioner

## 2019-06-03 ENCOUNTER — Ambulatory Visit (INDEPENDENT_AMBULATORY_CARE_PROVIDER_SITE_OTHER): Payer: PRIVATE HEALTH INSURANCE | Admitting: Nurse Practitioner

## 2019-06-03 ENCOUNTER — Telehealth: Payer: Self-pay

## 2019-06-03 ENCOUNTER — Encounter: Payer: Self-pay | Admitting: Nurse Practitioner

## 2019-06-03 ENCOUNTER — Ambulatory Visit: Payer: Self-pay | Admitting: Pharmacist

## 2019-06-03 ENCOUNTER — Other Ambulatory Visit: Payer: Self-pay

## 2019-06-03 VITALS — BP 132/88 | HR 78 | Temp 98.7°F | Ht 71.0 in | Wt 270.0 lb

## 2019-06-03 DIAGNOSIS — M1990 Unspecified osteoarthritis, unspecified site: Secondary | ICD-10-CM

## 2019-06-03 DIAGNOSIS — Z0000 Encounter for general adult medical examination without abnormal findings: Secondary | ICD-10-CM

## 2019-06-03 DIAGNOSIS — F419 Anxiety disorder, unspecified: Secondary | ICD-10-CM

## 2019-06-03 DIAGNOSIS — Z8679 Personal history of other diseases of the circulatory system: Secondary | ICD-10-CM | POA: Insufficient documentation

## 2019-06-03 DIAGNOSIS — E1159 Type 2 diabetes mellitus with other circulatory complications: Secondary | ICD-10-CM

## 2019-06-03 DIAGNOSIS — G20 Parkinson's disease: Secondary | ICD-10-CM

## 2019-06-03 DIAGNOSIS — G20A1 Parkinson's disease without dyskinesia, without mention of fluctuations: Secondary | ICD-10-CM

## 2019-06-03 DIAGNOSIS — R809 Proteinuria, unspecified: Secondary | ICD-10-CM

## 2019-06-03 DIAGNOSIS — K219 Gastro-esophageal reflux disease without esophagitis: Secondary | ICD-10-CM

## 2019-06-03 DIAGNOSIS — E1129 Type 2 diabetes mellitus with other diabetic kidney complication: Secondary | ICD-10-CM

## 2019-06-03 DIAGNOSIS — E1169 Type 2 diabetes mellitus with other specified complication: Secondary | ICD-10-CM | POA: Diagnosis not present

## 2019-06-03 DIAGNOSIS — E785 Hyperlipidemia, unspecified: Secondary | ICD-10-CM

## 2019-06-03 LAB — MICROALBUMIN, URINE WAIVED
Creatinine, Urine Waived: 100 mg/dL (ref 10–300)
Microalb, Ur Waived: 10 mg/L (ref 0–19)
Microalb/Creat Ratio: <30 mg/g (ref <30)

## 2019-06-03 NOTE — Chronic Care Management (AMB) (Signed)
  Chronic Care Management   Follow Up Note   06/03/2019 Name: Brian Frederick MRN: [id number] DOB: 06/27/1956  Referred by: [name], NP Reason for referral : Chronic Care Management (Medication Management)   Brian Frederick is a 63 y.o. year old male who is a primary care patient of Cannady, Barbaraann Faster, NP. The CCM team was consulted for assistance with chronic disease management and care coordination needs.    Met with patient during primary care provider appointment. Provided with the information from [place], RN CM regarding sleep studies   [facility] ([place], [phone])  in-home sleep study for $250, but NOT in network with patient's insurance.   Review of patient status, including review of consultants reports, relevant laboratory and other test results, and collaboration with appropriate care team members and the patient's provider was performed as part of comprehensive patient evaluation and provision of chronic care management services.    Goals Addressed            This Visit's Progress     Patient Stated   . "I can't afford my diabetes medications" (pt-stated)       Current Barriers:  . Financial Barriers- patient changed from a commercial BCBS plan to commercial [employer] plan this calendar year, and has had barriers in receiving medications due to formulary preferences . Patient has been taking Trulicity 8.56 mg x 2 weeks and Levemir 35 units QHS, as well as switching to metformin ER 1000 mg BID. Notes GI tolerability; also notes a slight lump under a Levemir injection site, asks if this is normal. Denies affordability concerns today  Pharmacist Clinical Goal(s):  [name] Kitchen Over the next 90 days, patient will work with primary care provider and PharmD to address needs related to optimized diabetic medication management  Interventions: . Encouraged to rotate insulin injection sites to reduce risk of lipohypertrophy. Encouraged to  avoid lump area for injections.  . Reminded of Trulicity coupon card availability if future financial concerns with copayment. He noted that cost was $25/1 month.   Patient Self Care Activities:  . Self administers medications as prescribed . Calls provider office for new concerns or questions  . Checks fasting blood sugar daily as directed  Please see past updates related to this goal by clicking on the "Past Updates" button in the selected goal         Plan:  - PharmD will outreach patient in 3-4 weeks for continued support in medication management  Catie Darnelle Maffucci, PharmD Clinical Pharmacist [place] [phone]

## 2019-06-03 NOTE — Assessment & Plan Note (Signed)
Followed by neurology, continue collaboration.

## 2019-06-03 NOTE — Assessment & Plan Note (Signed)
Recommend continued focus on health diet choices and regular physical activity (30 minutes 5 days a week). 

## 2019-06-03 NOTE — Assessment & Plan Note (Signed)
Pain to right knee ongoing, intermittent.  Will schedule next week for injection.

## 2019-06-03 NOTE — Patient Instructions (Signed)
Carbohydrate Counting for Diabetes Mellitus, Adult  Carbohydrate counting is a method of keeping track of how many carbohydrates you eat. Eating carbohydrates naturally increases the amount of sugar (glucose) in the blood. Counting how many carbohydrates you eat helps keep your blood glucose within normal limits, which helps you manage your diabetes (diabetes mellitus). It is important to know how many carbohydrates you can safely have in each meal. This is different for every person. A diet and nutrition specialist (registered dietitian) can help you make a meal plan and calculate how many carbohydrates you should have at each meal and snack. Carbohydrates are found in the following foods:  Grains, such as breads and cereals.  Dried beans and soy products.  Starchy vegetables, such as potatoes, peas, and corn.  Fruit and fruit juices.  Milk and yogurt.  Sweets and snack foods, such as cake, cookies, candy, chips, and soft drinks. How do I count carbohydrates? There are two ways to count carbohydrates in food. You can use either of the methods or a combination of both. Reading "Nutrition Facts" on packaged food The "Nutrition Facts" list is included on the labels of almost all packaged foods and beverages in the [place] It includes:  The serving size.  Information about nutrients in each serving, including the grams (g) of carbohydrate per serving. To use the "Nutrition Facts":  Decide how many servings you will have.  Multiply the number of servings by the number of carbohydrates per serving.  The resulting number is the total amount of carbohydrates that you will be having. Learning standard serving sizes of other foods When you eat carbohydrate foods that are not packaged or do not include "Nutrition Facts" on the label, you need to measure the servings in order to count the amount of carbohydrates:  Measure the foods that you will eat with a food scale or measuring cup, if needed.   Decide how many standard-size servings you will eat.  Multiply the number of servings by 15. Most carbohydrate-rich foods have about 15 g of carbohydrates per serving. ? For example, if you eat 8 oz (170 g) of strawberries, you will have eaten 2 servings and 30 g of carbohydrates (2 servings x 15 g = 30 g).  For foods that have more than one food mixed, such as soups and casseroles, you must count the carbohydrates in each food that is included. The following list contains standard serving sizes of common carbohydrate-rich foods. Each of these servings has about 15 g of carbohydrates:   hamburger bun or  English muffin.   oz (15 mL) syrup.   oz (14 g) jelly.  1 slice of bread.  1 six-inch tortilla.  3 oz (85 g) cooked rice or pasta.  4 oz (113 g) cooked dried beans.  4 oz (113 g) starchy vegetable, such as peas, corn, or potatoes.  4 oz (113 g) hot cereal.  4 oz (113 g) mashed potatoes or  of a large baked potato.  4 oz (113 g) canned or frozen fruit.  4 oz (120 mL) fruit juice.  4-6 crackers.  6 chicken nuggets.  6 oz (170 g) unsweetened dry cereal.  6 oz (170 g) plain fat-free yogurt or yogurt sweetened with artificial sweeteners.  8 oz (240 mL) milk.  8 oz (170 g) fresh fruit or one small piece of fruit.  24 oz (680 g) popped popcorn. Example of carbohydrate counting Sample meal  3 oz (85 g) chicken breast.  6 oz (170 g)   brown rice.  4 oz (113 g) corn.  8 oz (240 mL) milk.  8 oz (170 g) strawberries with sugar-free whipped topping. Carbohydrate calculation 1. Identify the foods that contain carbohydrates: ? Rice. ? Corn. ? Milk. ? Strawberries. 2. Calculate how many servings you have of each food: ? 2 servings rice. ? 1 serving corn. ? 1 serving milk. ? 1 serving strawberries. 3. Multiply each number of servings by 15 g: ? 2 servings rice x 15 g = 30 g. ? 1 serving corn x 15 g = 15 g. ? 1 serving milk x 15 g = 15 g. ? 1 serving  strawberries x 15 g = 15 g. 4. Add together all of the amounts to find the total grams of carbohydrates eaten: ? 30 g + 15 g + 15 g + 15 g = 75 g of carbohydrates total. Summary  Carbohydrate counting is a method of keeping track of how many carbohydrates you eat.  Eating carbohydrates naturally increases the amount of sugar (glucose) in the blood.  Counting how many carbohydrates you eat helps keep your blood glucose within normal limits, which helps you manage your diabetes.  A diet and nutrition specialist (registered dietitian) can help you make a meal plan and calculate how many carbohydrates you should have at each meal and snack. This information is not intended to replace advice given to you by your health care provider. Make sure you discuss any questions you have with your health care provider. Document Released: 12/03/2005 Document Revised: 06/12/2017 Document Reviewed: 05/16/2016 Elsevier Interactive Patient Education  2019 Elsevier Inc.  

## 2019-06-03 NOTE — Assessment & Plan Note (Signed)
Chronic, ongoing.  Continue current medication regimen.   

## 2019-06-03 NOTE — Assessment & Plan Note (Signed)
Stable on Lexapro.  Continue low dose medication and monitor.  Denies SI/HI. 

## 2019-06-03 NOTE — Progress Notes (Signed)
BP 132/88   Pulse 78   Temp 98.7 F (37.1 C) (Oral)   Ht _0  (1.803 m)   Wt 270 lb (122.5 kg)   SpO2 96%   BMI 37.66 kg/m    Subjective:    Patient ID: Brian Frederick, male    DOB: [birth year], 63 y.o.   MRN: [id number]  HPI: Brian Frederick is a 63 y.o. male presenting on 06/03/2019 for comprehensive medical examination. Current medical complaints include:none  He currently lives with: wife Interim Problems from his last visit: no   DIABETES Changed to Trulicity and Metformin XR on 05/13/2019 with reduction in Levemir to 25 units.  May 2020 A1C 6.1%. Hypoglycemic episodes:no Polydipsia/polyuria: no Visual disturbance: no Chest pain: no Paresthesias: no Glucose Monitoring: yes  Accucheck frequency: Daily  Fasting glucose: 100-120  Post prandial:  Evening:  Before meals: Taking Insulin?: yes  Long acting insulin: Levemir 25 units  Short acting insulin: Blood Pressure Monitoring: weekly Retinal Examination: Up to Date Foot Exam: Up to Date Pneumovax: Up to Date Influenza: Up to Date Aspirin: yes   HYPERTENSION / HYPERLIPIDEMIA Takes Lisinopril 2.5 MG daily, Metoprolol 100 MG daily, and Lipitor 20 MG. Satisfied with current treatment? yes Duration of hypertension: chronic BP monitoring frequency: weekly BP range: 120-130/80's, sometimes has SBP 150 or DBP 90 but not often BP medication side effects: no Duration of hyperlipidemia: chronic Cholesterol medication side effects: no Cholesterol supplements: none Medication compliance: good compliance Aspirin: yes Recent stressors: no Recurrent headaches: no Visual changes: no Palpitations: no Dyspnea: no Chest pain: no Lower extremity edema: no Dizzy/lightheaded: no   PARKINSON"S DISEASE: Diagnosed in 2015.  Sees Dr. Carles Collet.  Continues on Mirapex.  Denies any recent falls or decline in function.  Reports good control on current regimen.  Sees Dr. Carles Collet in a few months.  ANXIETY/STRESS On Lexapro 10 MG daily.  Duration:controlled Anxious mood: no  Excessive worrying: no Irritability: no  Sweating: no Nausea: no Palpitations:no Hyperventilation: no Panic attacks: no Agoraphobia: no  Obscessions/compulsions: no Depressed mood: no Anhedonia: no Weight changes: no Insomnia: yes hard to stay asleep  Hypersomnia: no Fatigue/loss of energy: no Feelings of worthlessness: no Feelings of guilt: no Impaired concentration/indecisiveness: no Suicidal ideations: no  Crying spells: no Recent Stressors/Life Changes: no   Relationship problems: no   Family stress: no     Financial stress: no    Job stress: no    Recent death/loss: no  GERD On Prevacid 30 MG daily. GERD control status: stable  Satisfied with current treatment? yes Heartburn frequency:  Medication side effects: no  Medication compliance: stable Previous GERD medications: Antacid use frequency:   Duration:  Nature:  Location:  Heartburn duration:  Alleviatiating factors:   Aggravating factors:  Dysphagia: no Odynophagia:  no Hematemesis: no Blood in stool: no EGD: no   KNEE PAIN (RIGHT) Has had ongoing knee flares for several years. Was told by Dr. Tamala Julian he had a small mensicus tear, but no surgery recommended.  Has been seen by [facility] in [place] and injections done, but insurance does not cover to go there anymore.   Duration: chronic Involved knee: right Mechanism of injury: unknown Location:lateral Onset: gradual Severity: 5/10  Quality:  dull and aching Frequency: intermittent Radiation: no Aggravating factors: walking and movement  Alleviating factors: Biofreeze and knee brace and rest  Status: fluctuating Treatments attempted: Biofreeze and knee brace and APAP  Relief with NSAIDs?:  No NSAIDs Taken Weakness with weight bearing or  walking: no Sensation of giving way: no Locking: no Popping: no Bruising: no Swelling: no Redness: no Paresthesias/decreased sensation: no Fevers: no    Functional Status Survey: Is the patient deaf or have difficulty hearing?: No Does the patient have difficulty seeing, even when wearing glasses/contacts?: No Does the patient have difficulty concentrating, remembering, or making decisions?: No Does the patient have difficulty walking or climbing stairs?: No Does the patient have difficulty dressing or bathing?: No Does the patient have difficulty doing errands alone such as visiting a doctor's office or shopping?: No  FALL RISK: Fall Risk  06/03/2019 05/05/2019 09/10/2018 09/02/2018 04/22/2018  Falls in the past year? 0 0 No No No  Number falls in past yr: - 0 - - -  Injury with Fall? - 0 - - -  Follow up Falls evaluation completed - - - -    Depression Screen Depression screen [facility] 2/9 06/03/2019 05/05/2019 09/10/2018 05/02/2018 10/25/2017  Decreased Interest 0 0 0 0 0  Down, Depressed, Hopeless 0 0 0 0 0  PHQ - 2 Score 0 0 0 0 0  Altered sleeping 2 3 - 0 1  Tired, decreased energy 0 0 - 1 1  Change in appetite 0 0 - 0 0  Feeling bad or failure about yourself  0 0 - 0 0  Trouble concentrating 0 0 - 0 0  Moving slowly or fidgety/restless 0 0 - 0 0  Suicidal thoughts 0 0 - 0 0  PHQ-9 Score 2 3 - 1 2  Difficult doing work/chores - Not difficult at all - - -    Advanced Directives <no information>  Past Medical History:  Past Medical History:  Diagnosis Date  . Anxiety   . BPH (benign prostatic hypertrophy)   . Cancer ([place])    basal cell  . Chronic kidney disease   . Hyperlipidemia   . Hypertension   . Hypogonadism in male   . Osteoarthritis   . Prediabetes   . SVT (supraventricular tachycardia) [facility])     Surgical History:  Past Surgical History:  Procedure Laterality Date  . BASAL CELL CARCINOMA EXCISION    . cardio oblation    . CHOLECYSTECTOMY    . COLONOSCOPY WITH PROPOFOL N/A 04/12/2017   Procedure: COLONOSCOPY WITH PROPOFOL;  Surgeon: [name], MD;  Location: [place];  Service: Endoscopy;  Laterality:  N/A;  . JOINT REPLACEMENT Left    shoulder  . JOINT REPLACEMENT Right    shoulder  . POLYPECTOMY N/A 04/12/2017   Procedure: POLYPECTOMY;  Surgeon: [name], MD;  Location: [place];  Service: Endoscopy;  Laterality: N/A;  . SHOULDER SURGERY Right 11/28/2018    Medications:  Current Outpatient Medications on File Prior to Visit  Medication Sig  . aspirin 81 MG tablet Take 81 mg by mouth daily.  [name] Kitchen atorvastatin (LIPITOR) 20 MG tablet take 1 tablet by mouth once daily  . blood glucose meter kit and supplies KIT Dispense based on patient and insurance preference. Use up to four times daily as directed. (FOR ICD-9 250.00, 250.01).  . Dulaglutide (TRULICITY) 6.96 EX/5.2WU SOPN Inject 0.75 mg into the skin once a week.  . escitalopram (LEXAPRO) 10 MG tablet TAKE 1 TABLET(10 MG) BY MOUTH DAILY  . Insulin Detemir (LEVEMIR FLEXTOUCH) 100 UNIT/ML Pen ADMINISTER 35 UNITS UNDER THE SKIN AT BEDTIME  . Insulin Pen Needle 32G X 4 MM MISC 1 Units by Does not apply route every morning. Summary: 1 Units by Does not apply  route every morning. Pen needles, Starting Fri 09/05/2018, Normal  . Lancets (ONETOUCH DELICA PLUS [id number]) MISC USE TO TEST BLOOD SUGAR UP TO QID UTD  . lansoprazole (PREVACID) 30 MG capsule TAKE 1 CAPSULE BY MOUTH EVERY DAY  . lisinopril (ZESTRIL) 2.5 MG tablet TAKE 1 TABLET(2.5 MG) BY MOUTH DAILY  . meloxicam (MOBIC) 15 MG tablet Take 1 tablet (15 mg total) by mouth daily.  . metFORMIN (GLUCOPHAGE-XR) 500 MG 24 hr tablet Take 2 tablets (1,000 mg total) by mouth 2 (two) times daily.  . metoprolol succinate (TOPROL-XL) 100 MG 24 hr tablet Take 1 tablet (100 mg total) by mouth daily. Take with or immediately following a meal.  . Multiple Vitamin (MULTIVITAMIN) tablet Take 1 tablet by mouth daily.  . pramipexole (MIRAPEX) 1 MG tablet Take 1 tablet (1 mg total) by mouth 3 (three) times daily.  . Tadalafil (CIALIS) 2.5 MG TABS Take 1 tablet (2.5 mg total) daily by mouth.  .  traZODone (DESYREL) 50 MG tablet Take 0.5 tablets (25 mg total) by mouth at bedtime. (Patient not taking: Reported on 06/03/2019)   No current facility-administered medications on file prior to visit.     Allergies:  Allergies  Allergen Reactions  . Amoxil [Amoxicillin] Swelling  . Penicillin G Benzathine Swelling    Social History:  Social History   Socioeconomic History  . Marital status: Married    Spouse name: Not on file  . Number of children: Not on file  . Years of education: Not on file  . Highest education level: Not on file  Occupational History  . Occupation: retired    Comment: Manufacturing systems engineer  . Financial resource strain: Not on file  . Food insecurity    Worry: Not on file    Inability: Not on file  . Transportation needs    Medical: Not on file    Non-medical: Not on file  Tobacco Use  . Smoking status: Never Smoker  . Smokeless tobacco: Never Used  Substance and Sexual Activity  . Alcohol use: No    Alcohol/week: 0.0 standard drinks  . Drug use: No  . Sexual activity: Yes  Lifestyle  . Physical activity    Days per week: Not on file    Minutes per session: Not on file  . Stress: Not on file  Relationships  . Social Herbalist on phone: Not on file    Gets together: Not on file    Attends religious service: Not on file    Active member of club or organization: Not on file    Attends meetings of clubs or organizations: Not on file    Relationship status: Not on file  . Intimate partner violence    Fear of current or ex partner: Not on file    Emotionally abused: Not on file    Physically abused: Not on file    Forced sexual activity: Not on file  Other Topics Concern  . Not on file  Social History Narrative  . Not on file   Social History   Tobacco Use  Smoking Status Never Smoker  Smokeless Tobacco Never Used   Social History   Substance and Sexual Activity  Alcohol Use No  . Alcohol/week: 0.0 standard drinks     Family History:  Family History  Problem Relation Age of Onset  . Cancer Mother        breast  . Heart disease Father 45  CABG  . Diabetes Father   . CAD Father   . Hyperlipidemia Father   . Hypertension Father   . Cancer Sister        breast  . Diabetes Paternal Grandmother   . Hypothyroidism Sister   . Stroke Maternal Grandfather     Past medical history, surgical history, medications, allergies, family history and social history reviewed with patient today and changes made to appropriate areas of the chart.   Review of Systems - Negative All other ROS negative except what is listed above and in the HPI.      Objective:    BP 132/88   Pulse 78   Temp 98.7 F (37.1 C) (Oral)   Ht '5\' 11"'$  (1.803 m)   Wt 270 lb (122.5 kg)   SpO2 96%   BMI 37.66 kg/m   Wt Readings from Last 3 Encounters:  06/03/19 270 lb (122.5 kg)  05/05/19 263 lb (119.3 kg)  02/03/19 261 lb 8 oz (118.6 kg)    Physical Exam Vitals signs and nursing note reviewed.  Constitutional:      Appearance: He is well-developed.  HENT:     Head: Normocephalic and atraumatic.     Right Ear: Hearing normal. No drainage.     Left Ear: Hearing normal. No drainage.     Mouth/Throat:     Pharynx: Uvula midline.  Eyes:     General: Lids are normal.        Right eye: No discharge.        Left eye: No discharge.     Conjunctiva/sclera: Conjunctivae normal.     Pupils: Pupils are equal, round, and reactive to light.  Neck:     Musculoskeletal: Normal range of motion and neck supple.     Thyroid: No thyromegaly.     Vascular: No carotid bruit or JVD.     Trachea: Trachea normal.  Cardiovascular:     Rate and Rhythm: Normal rate and regular rhythm.     Heart sounds: Normal heart sounds, S1 normal and S2 normal. No murmur. No gallop.   Pulmonary:     Effort: Pulmonary effort is normal.     Breath sounds: Normal breath sounds.  Abdominal:     General: Bowel sounds are normal.     Palpations: Abdomen is  soft. There is no hepatomegaly or splenomegaly.  Musculoskeletal: Normal range of motion.     Right knee: He exhibits normal range of motion, no swelling, no laceration and no erythema. No tenderness found.     Left knee: He exhibits normal range of motion, no swelling, no laceration and no erythema. No tenderness found.     Right lower leg: No edema.     Left lower leg: No edema.     Comments: Mild crepitus to right knee noted.  Skin:    General: Skin is warm and dry.     Capillary Refill: Capillary refill takes less than 2 seconds.     Findings: No rash.  Neurological:     Mental Status: He is alert and oriented to person, place, and time.     Cranial Nerves: Cranial nerves are intact.     Coordination: Coordination is intact.     Gait: Gait is intact.     Deep Tendon Reflexes:     Reflex Scores:      Brachioradialis reflexes are 1+ on the right side and 1+ on the left side.      Patellar reflexes are 1+  on the right side and 1+ on the left side.    Comments: No cogwheel noted bilaterally.  Psychiatric:        Mood and Affect: Mood normal.        Behavior: Behavior normal.        Thought Content: Thought content normal.        Judgment: Judgment normal.    Diabetic Foot Exam - Simple   Simple Foot Form Visual Inspection No deformities, no ulcerations, no other skin breakdown bilaterally: Yes Sensation Testing Intact to touch and monofilament testing bilaterally: Yes Pulse Check Posterior Tibialis and Dorsalis pulse intact bilaterally: Yes Comments     Results for orders placed or performed in visit on 50/03/70  Basic Metabolic Panel (BMET)  Result Value Ref Range   Glucose 95 65 - 99 mg/dL   BUN 19 8 - 27 mg/dL   Creatinine, Ser 1.06 0.76 - 1.27 mg/dL   GFR calc non Af Amer 74 >59 mL/min/1.73   GFR calc Af Amer 86 >59 mL/min/1.73   BUN/Creatinine Ratio 18 10 - 24   Sodium 142 134 - 144 mmol/L   Potassium 4.4 3.5 - 5.2 mmol/L   Chloride 103 96 - 106 mmol/L   CO2  23 20 - 29 mmol/L   Calcium 9.1 8.6 - 10.2 mg/dL  Lipid Panel Piccolo, Waived  Result Value Ref Range   Cholesterol Piccolo, Waived 123 <200 mg/dL   HDL Chol Piccolo, Waived 36 (L) >59 mg/dL   Triglycerides Piccolo,Waived 175 (H) <150 mg/dL   Chol/HDL Ratio Piccolo,Waive 3.4 mg/dL   LDL Chol Calc Piccolo Waived 52 <100 mg/dL   VLDL Chol Calc Piccolo,Waive 35 (H) <30 mg/dL  Bayer DCA Hb A1c Waived  Result Value Ref Range   HB A1C (BAYER DCA - WAIVED) 6.1 <7.0 %      Assessment & Plan:   Problem List Items Addressed This Visit      Cardiovascular and Mediastinum   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HCC)    Chronic, ongoing with HTN and h/o open heart surgery.  Continue current medication regimen, as recent A1C 6.1%.  He is to return in August for A1C check.  Recommend monitoring BP at home three days a week.          Relevant Orders   Microalbumin, Urine Waived   Vitamin B12     Digestive   GERD (gastroesophageal reflux disease)    Chronic, stable with medication.  Continue current medication regimen.  Mag level today.      Relevant Orders   Magnesium     Endocrine   Hyperlipidemia associated with type 2 diabetes mellitus (HCC)    Chronic, ongoing.  Continue current medication regimen.        Diabetes mellitus with proteinuria (HCC)    Continue Lisinopril low dose for kidney protection.  Check urine micro today.          Nervous and Auditory   Parkinson's disease ([place])    Followed by neurology, continue collaboration.        Musculoskeletal and Integument   Osteoarthritis    Pain to right knee ongoing, intermittent.  Will schedule next week for injection.        Other   Morbid obesity ([place])    Recommend continued focus on health diet choices and regular physical activity (30 minutes 5 days a week).      Chronic anxiety    Stable on Lexapro.  Continue low dose  medication and monitor.  Denies SI/HI.       Other Visit Diagnoses     Encounter for annual physical exam    -  Primary   Relevant Orders   TSH   PSA       Discussed aspirin prophylaxis for myocardial infarction prevention and decision was made to continue ASA  LABORATORY TESTING:  Health maintenance labs ordered today as discussed above.   The natural history of prostate cancer and ongoing controversy regarding screening and potential treatment outcomes of prostate cancer has been discussed with the patient. The meaning of a false positive PSA and a false negative PSA has been discussed. He indicates understanding of the limitations of this screening test and wishes to proceed with screening PSA testing.   IMMUNIZATIONS:   - Tdap: Tetanus vaccination status reviewed: last tetanus booster within 10 years. - Influenza: Up to date - Pneumovax: Up to date - Prevnar: Not applicable - Zostavax vaccine: Up to date  SCREENING: - Colonoscopy: Up to date  Discussed with patient purpose of the colonoscopy is to detect colon cancer at curable precancerous or early stages   - AAA Screening: Not applicable  -Hearing Test: Not applicable  -Spirometry: Not applicable   PATIENT COUNSELING:    Sexuality: Discussed sexually transmitted diseases, partner selection, use of condoms, avoidance of unintended pregnancy  and contraceptive alternatives.   Advised to avoid cigarette smoking.  I discussed with the patient that most people either abstain from alcohol or drink within safe limits (<=14/week and <=4 drinks/occasion for males, <=7/weeks and <= 3 drinks/occasion for females) and that the risk for alcohol disorders and other health effects rises proportionally with the number of drinks per week and how often a drinker exceeds daily limits.  Discussed cessation/primary prevention of drug use and availability of treatment for abuse.   Diet: Encouraged to adjust caloric intake to maintain  or achieve ideal body weight, to reduce intake of dietary saturated fat and  total fat, to limit sodium intake by avoiding high sodium foods and not adding table salt, and to maintain adequate dietary potassium and calcium preferably from fresh fruits, vegetables, and low-fat dairy products.    stressed the importance of regular exercise  Injury prevention: Discussed safety belts, safety helmets, smoke detector, smoking near bedding or upholstery.   Dental health: Discussed importance of regular tooth brushing, flossing, and dental visits.   Follow up plan: NEXT PREVENTATIVE PHYSICAL DUE IN 1 YEAR. Return in about 2 months (around 08/03/2019) for T2DM, HTN/HLD.

## 2019-06-03 NOTE — Assessment & Plan Note (Signed)
Continue Lisinopril low dose for kidney protection.  Check urine micro today.

## 2019-06-03 NOTE — Assessment & Plan Note (Signed)
Chronic, ongoing with HTN and h/o open heart surgery.  Continue current medication regimen, as recent A1C 6.1%.  He is to return in August for A1C check.  Recommend monitoring BP at home three days a week.

## 2019-06-03 NOTE — Patient Instructions (Signed)
Visit Information  Goals Addressed            This Visit's Progress     Patient Stated   . "I can't afford my diabetes medications" (pt-stated)       Current Barriers:  . Financial Barriers- patient changed from a commercial [employer] plan to commercial [employer] plan this calendar year, and has had barriers in receiving medications due to formulary preferences . Patient has been taking Trulicity 1.10 mg x 2 weeks and Levemir 35 units QHS, as well as switching to metformin ER 1000 mg BID. Notes GI tolerability; also notes a slight lump under a Levemir injection site, asks if this is normal. Denies affordability concerns today  Pharmacist Clinical Goal(s):  [name] Kitchen Over the next 90 days, patient will work with primary care provider and PharmD to address needs related to optimized diabetic medication management  Interventions: . Encouraged to rotate insulin injection sites to reduce risk of lipohypertrophy. Encouraged to avoid lump area for injections.  . Reminded of Trulicity coupon card availability if future financial concerns with copayment. He noted that cost was $25/1 month.   Patient Self Care Activities:  . Self administers medications as prescribed . Calls provider office for new concerns or questions  . Checks fasting blood sugar daily as directed  Please see past updates related to this goal by clicking on the "Past Updates" button in the selected goal         The patient verbalized understanding of instructions provided today and declined a print copy of patient instruction materials.   Plan:  - PharmD will outreach patient in 3-4 weeks for continued support in medication management  Catie Darnelle Maffucci, PharmD Clinical Pharmacist [place] [phone]

## 2019-06-03 NOTE — Assessment & Plan Note (Signed)
Chronic, stable with medication.  Continue current medication regimen.  Mag level today.

## 2019-06-04 LAB — PSA: Prostate Specific Ag, Serum: 0.2 ng/mL (ref 0.0–4.0)

## 2019-06-04 LAB — MAGNESIUM: Magnesium: 1.9 mg/dL (ref 1.6–2.3)

## 2019-06-04 LAB — TSH: TSH: 2.51 u[IU]/mL (ref 0.450–4.500)

## 2019-06-04 LAB — VITAMIN B12: Vitamin B-12: 643 pg/mL (ref 232–1245)

## 2019-06-09 ENCOUNTER — Other Ambulatory Visit: Payer: Self-pay

## 2019-06-09 ENCOUNTER — Encounter: Payer: Self-pay | Admitting: Nurse Practitioner

## 2019-06-09 ENCOUNTER — Ambulatory Visit (INDEPENDENT_AMBULATORY_CARE_PROVIDER_SITE_OTHER): Payer: PRIVATE HEALTH INSURANCE | Admitting: Nurse Practitioner

## 2019-06-09 ENCOUNTER — Telehealth: Payer: Self-pay

## 2019-06-09 DIAGNOSIS — M1990 Unspecified osteoarthritis, unspecified site: Secondary | ICD-10-CM | POA: Diagnosis not present

## 2019-06-09 NOTE — Assessment & Plan Note (Signed)
Knee injection to right knee.  Educated provided and verbal consent obtained.  To return if any s/s infection or any worsening symptoms.  Injection # 1 for the year.

## 2019-06-09 NOTE — Progress Notes (Signed)
BP (!) 144/87   Pulse 90   Temp 98.3 F (36.8 C) (Oral)   SpO2 97%    Subjective:    Patient ID: Brian Frederick, male    DOB: April 05, 1956, 63 y.o.   MRN: [id number]  HPI: Brian Frederick is a 63 y.o. male  Chief Complaint  Patient presents with  . Knee Pain   KNEE (RIGHT) PAIN Chronic pain with flares.  Has gotten steroid injections before with benefit.  Has gotten relief in the past from it for 3 months.  Duration: chronic Involved knee: right Mechanism of injury: unknown Location:anterior Onset: gradual Severity: 6/10  Quality:  dull and aching Frequency: intermittent Radiation: no Aggravating factors: walking and bending  Alleviating factors: ice and NSAIDs  Status: fluctuating Treatments attempted: ice and APAP  Relief with NSAIDs?:  mild Weakness with weight bearing or walking: no Sensation of giving way: no Locking: no Popping: no Bruising: no Swelling: no Redness: no Paresthesias/decreased sensation: no Fevers: no   Relevant past medical, surgical, family and social history reviewed and updated as indicated. Interim medical history since our last visit reviewed. Allergies and medications reviewed and updated.  Review of Systems  Constitutional: Negative for activity change, diaphoresis, fatigue and fever.  Respiratory: Negative for cough, chest tightness, shortness of breath and wheezing.   Cardiovascular: Negative for chest pain, palpitations and leg swelling.  Gastrointestinal: Negative for abdominal distention, abdominal pain, constipation, diarrhea, nausea and vomiting.  Musculoskeletal: Positive for arthralgias.  Skin: Negative.   Psychiatric/Behavioral: Negative.     Per HPI unless specifically indicated above     Objective:    BP (!) 144/87   Pulse 90   Temp 98.3 F (36.8 C) (Oral)   SpO2 97%   Wt Readings from Last 3 Encounters:  06/03/19 270 lb (122.5 kg)  05/05/19 263 lb (119.3 kg)  02/03/19 261 lb 8 oz (118.6 kg)    Physical  Exam Vitals signs and nursing note reviewed.  Constitutional:      General: He is awake. He is not in acute distress.    Appearance: He is well-developed. He is not ill-appearing.  HENT:     Head: Normocephalic and atraumatic.     Right Ear: Hearing normal. No drainage.     Left Ear: Hearing normal. No drainage.     Mouth/Throat:     Pharynx: Uvula midline.  Eyes:     General: Lids are normal.        Right eye: No discharge.        Left eye: No discharge.     Conjunctiva/sclera: Conjunctivae normal.     Pupils: Pupils are equal, round, and reactive to light.  Neck:     Musculoskeletal: Normal range of motion and neck supple.  Cardiovascular:     Rate and Rhythm: Normal rate and regular rhythm.     Heart sounds: Normal heart sounds, S1 normal and S2 normal. No murmur. No gallop.   Pulmonary:     Effort: Pulmonary effort is normal. No accessory muscle usage or respiratory distress.     Breath sounds: Normal breath sounds.  Abdominal:     General: Bowel sounds are normal.     Palpations: Abdomen is soft.  Musculoskeletal:     Right knee: He exhibits normal range of motion, no swelling, no deformity, no laceration and no erythema. Tenderness (with ROM on occasion noted) found.     Left knee: He exhibits normal range of motion, no swelling and no  laceration. No tenderness found.     Right lower leg: No edema.     Left lower leg: No edema.  Skin:    General: Skin is warm and dry.     Capillary Refill: Capillary refill takes less than 2 seconds.     Findings: No rash.  Neurological:     Mental Status: He is alert and oriented to person, place, and time.  Psychiatric:        Mood and Affect: Mood normal.        Behavior: Behavior normal. Behavior is cooperative.        Thought Content: Thought content normal.        Judgment: Judgment normal.   STEROID INJECTION  Procedure: Knee Intraarticular Steroid Injection   Description: After verbal consent and patient ed on injection.   Area prepped and draped using  semi-sterile technique. Using a lateral  approach, a mixture of 4 cc of  1% Marcaine & 1 cc of Kenalog 40 was injected into knee joint.  A bandage was then placed over the injection site. Complications:  none Post Procedure Instructions: To the ER if any symptoms of erythema or swelling.   Follow Up: PRN   Results for orders placed or performed in visit on 06/03/19  Microalbumin, Urine Waived  Result Value Ref Range   Microalb, Ur Waived 10 0 - 19 mg/L   Creatinine, Urine Waived 100 10 - 300 mg/dL   Microalb/Creat Ratio <30 <30 mg/g  Vitamin B12  Result Value Ref Range   Vitamin B-12 643 232 - 1,245 pg/mL  TSH  Result Value Ref Range   TSH 2.510 0.450 - 4.500 uIU/mL  PSA  Result Value Ref Range   Prostate Specific Ag, Serum 0.2 0.0 - 4.0 ng/mL  Magnesium  Result Value Ref Range   Magnesium 1.9 1.6 - 2.3 mg/dL      Assessment & Plan:   Problem List Items Addressed This Visit      Musculoskeletal and Integument   Osteoarthritis    Knee injection to right knee.  Educated provided and verbal consent obtained.  To return if any s/s infection or any worsening symptoms.  Injection # 1 for the year.          Follow up plan: Return if symptoms worsen or fail to improve.

## 2019-06-09 NOTE — Patient Instructions (Signed)
Knee Injection A knee injection is a procedure to get medicine into your knee joint to relieve the pain, swelling, and stiffness of arthritis. Your health care provider uses a needle to inject medicine, which may also help to lubricate and cushion your knee joint. You may need more than one injection. Tell a health care provider about:  Any allergies you have.  All medicines you are taking, including vitamins, herbs, eye drops, creams, and over-the-counter medicines.  Any problems you or family members have had with anesthetic medicines.  Any blood disorders you have.  Any surgeries you have had.  Any medical conditions you have.  Whether you are pregnant or may be pregnant. What are the risks? Generally, this is a safe procedure. However, problems may occur, including:  Infection.  Bleeding.  Symptoms that get worse.  Damage to the area around your knee.  Allergic reaction to any of the medicines.  Skin reactions from repeated injections. What happens before the procedure?  Ask your health care provider about changing or stopping your regular medicines. This is especially important if you are taking diabetes medicines or blood thinners.  Plan to have someone take you home from the hospital or clinic. What happens during the procedure?   You will sit or lie down in a position for your knee to be treated.  The skin over your kneecap will be cleaned with a germ-killing soap.  You will be given a medicine that numbs the area (local anesthetic). You may feel some stinging.  The medicine will be injected into your knee. The needle is carefully placed between your kneecap and your knee. The medicine is injected into the joint space.  The needle will be removed at the end of the procedure.  A bandage (dressing) may be placed over the injection site. The procedure may vary among health care providers and hospitals. What can I expect after the procedure?  Your blood  pressure, heart rate, breathing rate, and blood oxygen level will be monitored until you leave the hospital or clinic.  You may have to move your knee through its full range of motion. This helps to get all the medicine into your joint space.  You will be watched to make sure that you do not have a reaction to the injected medicine.  You may feel more pain, swelling, and warmth than you did before the injection. This reaction may last about 1-2 days. Follow these instructions at home: Medicines  Take over-the-counter and prescription medicines only as told by your doctor.  Do not drive or use heavy machinery while taking prescription pain medicine.  Do not take medicines such as aspirin and ibuprofen unless your health care provider tells you to take them. Injection site care  Follow instructions from your health care provider about: ? How to take care of your puncture site. ? When and how you should change your dressing. ? When you should remove your dressing.  Check your injection area every day for signs of infection. Check for: ? More redness, swelling, or pain after 2 days. ? Fluid or blood. ? Pus or a bad smell. ? Warmth. Managing pain, stiffness, and swelling   If directed, put ice on the injection area: ? Put ice in a plastic bag. ? Place a towel between your skin and the bag. ? Leave the ice on for 20 minutes, 2-3 times per day.  Do not apply heat to your knee.  Raise (elevate) the injection area above the level   apply heat to your knee.  Raise (elevate) the injection area above the level of your heart while you are sitting or lying down.  General instructions  If you were given a dressing, keep it dry until your health care provider says it can be removed. Ask your health care provider when you can start showering or taking a bath.  Avoid strenuous activities for as long as directed by your health care provider. Ask your health care provider when you can return to your normal activities.  Keep all follow-up visits as told by your health care provider. This is important. You may need more injections.  Contact a health care provider if  you have:  A fever.  Warmth in your injection area.  Fluid, blood, or pus coming from your injection site.  Symptoms at your injection site that last longer than 2 days after your procedure.  Get help right away if:  Your knee:  Turns very red.  Becomes very swollen.  Is in severe pain.  Summary  A knee injection is a procedure to get medicine into your knee joint to relieve the pain, swelling, and stiffness of arthritis.  A needle is carefully placed between your kneecap and your knee to inject medicine into the joint space.  Before the procedure, ask your health care provider about changing or stopping your regular medicines, especially if you are taking diabetes medicines or blood thinners.  Contact your health care provider if you have any problems or questions after your procedure.  This information is not intended to replace advice given to you by your health care provider. Make sure you discuss any questions you have with your health care provider.  Document Released: 02/24/2007 Document Revised: 12/23/2017 Document Reviewed: 12/23/2017  [employer]vier Interactive Patient Education  2019 [employer]

## 2019-06-15 ENCOUNTER — Ambulatory Visit (INDEPENDENT_AMBULATORY_CARE_PROVIDER_SITE_OTHER): Payer: PRIVATE HEALTH INSURANCE | Admitting: Neurology

## 2019-06-15 ENCOUNTER — Encounter: Payer: Self-pay | Admitting: Neurology

## 2019-06-15 ENCOUNTER — Other Ambulatory Visit: Payer: Self-pay

## 2019-06-15 VITALS — BP 133/87 | HR 75 | Temp 98.3°F | Wt 268.4 lb

## 2019-06-15 DIAGNOSIS — G4709 Other insomnia: Secondary | ICD-10-CM

## 2019-06-15 DIAGNOSIS — G20 Parkinson's disease: Secondary | ICD-10-CM

## 2019-06-15 MED ORDER — PRAMIPEXOLE DIHYDROCHLORIDE 1 MG PO TABS: 1.0000 mg | ORAL_TABLET | Freq: Three times a day (TID) | ORAL | 1 refills | Status: DC

## 2019-06-15 MED ORDER — TRIHEXYPHENIDYL HCL 2 MG PO TABS: 2.0000 mg | ORAL_TABLET | Freq: Three times a day (TID) | ORAL | 3 refills | Status: DC

## 2019-06-15 NOTE — Patient Instructions (Signed)
Take trihexyphenidyl - 2 mg - 1 tablet twice per day for 2 weeks.  If tolerating this, you can increase this to 1 tablet three times per day.  The physicians and staff at [facility] are committed to providing excellent care. You may receive a survey requesting feedback about your experience at our office. We strive to receive "very good" responses to the survey questions. If you feel that your experience would prevent you from giving the office a "very good " response, please contact our office to try to remedy the situation. We may be reached at [phone]. Thank you for taking the time out of your busy day to complete the survey.

## 2019-06-15 NOTE — Progress Notes (Signed)
Brian Frederick was seen today in the movement disorders clinic for neurologic consultation at the request of [name], NP.  The consultation is for the evaluation of tremor on the L.   04/22/09 update: Patient is seen today in follow-up.  Patient was diagnosed with Parkinson's disease last visit and started on pramipexole and work to 0.5 mg 3 times per day.  It slowed tremor down some but its not gone.  Patient denies compulsive behaviors.  Denies sleep attacks.  Pt denies falls.  Pt denies lightheadedness, near syncope.  No hallucinations.  Mood has been good.  He has gained weight but reports he retired at the beginning of the year and hasn't been eating or exercising like he should.  Does have a stationary bike.  09/02/18 update: Patient is seen today in follow-up for Parkinson's disease.  His pramipexole was increased last visit so that he is now taking 1 mg 3 times per day. He states that his ankle was swelling.  He cut back to 75m, 0.5 mg in the afternoon, and he d/c the nighttime dosage because he was having trouble sleeping.  He is still having trouble sleeping.  He has had no compulsive behaviors or sleep attacks.  No hallucinations.  No lightheadedness or near syncope.  Records are reviewed since last visit.  He saw his nurse practitioner on August 29, 2018.  Sleep study was ordered for suspected sleep apnea.  In addition, his hemoglobin A1c was greater than 14.  Metformin was added.  He didn't know that he was diabetic previously.  He was dx after having thrush  01/06/19 update: Patient seen today in follow-up for Parkinson's disease.  Patient is on pramipexole, 1 mg 3 times per day.  He reports no sleep attacks.  No compulsive behaviors. No lightheadedness or near syncope.  He does state that he recently slipped going up a wooden ramp that was wet and slippery and he had stuff in his hands.  He fell on his knees.  I have reviewed records since our last visit.  His A1c is much better  than it was last visit.  It is now down to 7.3.  He had a total shoulder replacement last month and just started PT.  Its not really painful.  The arm is still stiff, however.  06/15/19 update: Patient seen today in follow-up for Parkinson's disease.  He is on pramipexole, 1 mg 3 times per day.  He denies any compulsive behaviors.  He denies sleep attacks.  Has had no falls since last visit.  No lightheadedness or near syncope.  No formal exercise program.    Medical records are reviewed since last visit.  He last saw his nurse practitioner on June 23 and had an injection in the right knee.  PREVIOUS MEDICATIONS: none to date  ALLERGIES:   Allergies  Allergen Reactions  . Amoxil [Amoxicillin] Swelling  . Penicillin G Benzathine Swelling    CURRENT MEDICATIONS:  Outpatient Encounter Medications as of 06/15/2019  Medication Sig  . aspirin 81 MG tablet Take 81 mg by mouth daily.  .[name] Kitchenatorvastatin (LIPITOR) 20 MG tablet take 1 tablet by mouth once daily  . blood glucose meter kit and supplies KIT Dispense based on patient and insurance preference. Use up to four times daily as directed. (FOR ICD-9 250.00, 250.01).  . Dulaglutide (TRULICITY) 09.79MGX/2.1JHSOPN Inject 0.75 mg into the skin once a week.  . escitalopram (LEXAPRO) 10 MG tablet TAKE 1 TABLET(10 MG)  BY MOUTH DAILY  . Insulin Detemir (LEVEMIR FLEXTOUCH) 100 UNIT/ML Pen ADMINISTER 35 UNITS UNDER THE SKIN AT BEDTIME  . Insulin Pen Needle 32G X 4 MM MISC 1 Units by Does not apply route every morning. Summary: 1 Units by Does not apply route every morning. Pen needles, Starting Fri 09/05/2018, Normal  . Lancets (ONETOUCH DELICA PLUS [id number]) MISC USE TO TEST BLOOD SUGAR UP TO QID UTD  . lansoprazole (PREVACID) 30 MG capsule TAKE 1 CAPSULE BY MOUTH EVERY DAY  . lisinopril (ZESTRIL) 2.5 MG tablet TAKE 1 TABLET(2.5 MG) BY MOUTH DAILY  . meloxicam (MOBIC) 15 MG tablet Take 1 tablet (15 mg total) by mouth daily.  . metFORMIN (GLUCOPHAGE-XR) 500  MG 24 hr tablet Take 2 tablets (1,000 mg total) by mouth 2 (two) times daily.  . metoprolol succinate (TOPROL-XL) 100 MG 24 hr tablet Take 1 tablet (100 mg total) by mouth daily. Take with or immediately following a meal.  . Multiple Vitamin (MULTIVITAMIN) tablet Take 1 tablet by mouth daily.  . pramipexole (MIRAPEX) 1 MG tablet Take 1 tablet (1 mg total) by mouth 3 (three) times daily.  . [DISCONTINUED] pramipexole (MIRAPEX) 1 MG tablet Take 1 tablet (1 mg total) by mouth 3 (three) times daily.  . [DISCONTINUED] Tadalafil (CIALIS) 2.5 MG TABS Take 1 tablet (2.5 mg total) daily by mouth.  . [DISCONTINUED] traZODone (DESYREL) 50 MG tablet Take 0.5 tablets (25 mg total) by mouth at bedtime.  . trihexyphenidyl (ARTANE) 2 MG tablet Take 1 tablet (2 mg total) by mouth 3 (three) times daily with meals.   No facility-administered encounter medications on file as of 06/15/2019.     PAST MEDICAL HISTORY:   Past Medical History:  Diagnosis Date  . Anxiety   . BPH (benign prostatic hypertrophy)   . Cancer ([place])    basal cell  . Chronic kidney disease   . Hyperlipidemia   . Hypertension   . Hypogonadism in male   . Osteoarthritis   . Prediabetes   . SVT (supraventricular tachycardia) ([place])     PAST SURGICAL HISTORY:   Past Surgical History:  Procedure Laterality Date  . BASAL CELL CARCINOMA EXCISION    . cardio oblation    . CHOLECYSTECTOMY    . COLONOSCOPY WITH PROPOFOL N/A 04/12/2017   Procedure: COLONOSCOPY WITH PROPOFOL;  Surgeon: [name], MD;  Location: [facility];  Service: Endoscopy;  Laterality: N/A;  . JOINT REPLACEMENT Left    shoulder  . JOINT REPLACEMENT Right    shoulder  . POLYPECTOMY N/A 04/12/2017   Procedure: POLYPECTOMY;  Surgeon: [name], MD;  Location: [place];  Service: Endoscopy;  Laterality: N/A;  . SHOULDER SURGERY Right 11/28/2018    SOCIAL HISTORY:   Social History   Socioeconomic History  . Marital status: Married    Spouse  name: Not on file  . Number of children: Not on file  . Years of education: Not on file  . Highest education level: Not on file  Occupational History  . Occupation: retired    Comment: Manufacturing systems engineer  . Financial resource strain: Not on file  . Food insecurity    Worry: Not on file    Inability: Not on file  . Transportation needs    Medical: Not on file    Non-medical: Not on file  Tobacco Use  . Smoking status: Never Smoker  . Smokeless tobacco: Never Used  Substance and Sexual Activity  . Alcohol use: No  Alcohol/week: 0.0 standard drinks  . Drug use: No  . Sexual activity: Yes  Lifestyle  . Physical activity    Days per week: Not on file    Minutes per session: Not on file  . Stress: Not on file  Relationships  . Social Herbalist on phone: Not on file    Gets together: Not on file    Attends religious service: Not on file    Active member of club or organization: Not on file    Attends meetings of clubs or organizations: Not on file    Relationship status: Not on file  . Intimate partner violence    Fear of current or ex partner: Not on file    Emotionally abused: Not on file    Physically abused: Not on file    Forced sexual activity: Not on file  Other Topics Concern  . Not on file  Social History Narrative  . Not on file    FAMILY HISTORY:   Family Status  Relation Name Status  . Mother  Deceased at age 9  . Father  Deceased at age 40  . Sister  Alive  . PGM  Deceased  . Sister  Alive  . Daughter  Alive  . MGM  Deceased  . MGF  Deceased  . PGF  Deceased  . Sister  Alive  . Daughter  Alive  . Daughter  Alive    ROS:  Review of Systems  Constitutional: Negative.   HENT: Negative.   Eyes: Negative.   Respiratory: Negative.   Cardiovascular: Negative.   Gastrointestinal: Negative.   Genitourinary: Negative.   Musculoskeletal: Positive for joint pain (knee).  Skin: Negative.      PHYSICAL EXAMINATION:    VITALS:    Vitals:   06/15/19 1050  BP: 133/87  Pulse: 75  Temp: 98.3 F (36.8 C)  SpO2: 96%  Weight: 268 lb 6.4 oz (121.7 kg)   Wt Readings from Last 3 Encounters:  06/15/19 268 lb 6.4 oz (121.7 kg)  06/03/19 270 lb (122.5 kg)  05/05/19 263 lb (119.3 kg)    GEN:  The patient appears stated age and is in NAD. HEENT:  Normocephalic, atraumatic.  The mucous membranes are moist. The superficial temporal arteries are without ropiness or tenderness. CV:  RRR Lungs:  CTAB Neck/HEME:  There are no carotid bruits bilaterally.  Neurological examination:  Orientation: The patient is alert and oriented x3. Cranial nerves: There is good facial symmetry. The speech is fluent and clear. Soft palate rises symmetrically and there is no tongue deviation. Hearing is intact to conversational tone. Sensation: Sensation is intact to light touch throughout Motor: Strength is 5/5 in the bilateral upper and lower extremities.   Shoulder shrug is equal and symmetric.  There is no pronator drift.  Movement examination: Tone: There is good tone in the UE/LE Abnormal movements: There is LUE resting tremor (same as previous), overall mod Coordination:  There is no decremation, with any form of RAMS, including alternating supination and pronation of the forearm, hand opening and closing, finger taps, heel taps bilaterally.  Minimal trouble with toe taps on the L Gait and Station: The patient has no difficulty arising out of a deep-seated chair without the use of the hands. The patient's stride length is normal.  Arm swing is good.    Labs:    Chemistry      Component Value Date/Time   NA 142 05/12/2019 0844   NA  142 09/02/2013 0220   K 4.4 05/12/2019 0844   K 4.2 09/02/2013 0220   CL 103 05/12/2019 0844   CL 107 09/02/2013 0220   CO2 23 05/12/2019 0844   CO2 25 09/02/2013 0220   BUN 19 05/12/2019 0844   BUN 23 (H) 09/02/2013 0220   CREATININE 1.06 05/12/2019 0844   CREATININE 1.52 (H) 09/02/2013 0220       Component Value Date/Time   CALCIUM 9.1 05/12/2019 0844   CALCIUM 9.4 09/02/2013 0220   ALKPHOS 104 02/03/2019 0840   ALKPHOS 107 09/02/2013 0220   AST 33 02/03/2019 0840   AST 30 09/02/2013 0220   ALT 28 02/03/2019 0840   ALT 38 09/02/2013 0220   BILITOT 0.5 02/03/2019 0840   BILITOT 0.3 09/02/2013 0220     Lab Results  Component Value Date   TSH 2.510 06/03/2019   Lab Results  Component Value Date   HGBA1C 6.1 05/12/2019      ASSESSMENT/PLAN:  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  We discussed treatment options as well as prognostic indicators.  Patient education was provided.  -We discussed that it used to be thought that levodopa would increase risk of melanoma but now it is believed that Parkinsons itself likely increases risk of melanoma. he is to get regular skin checks.    -Continue pramipexole, 1 mg tid.  This was refilled today.  Risks, benefits, side effects and alternative therapies were discussed.  The opportunity to ask questions was given and they were answered to the best of my ability.  The patient expressed understanding and willingness to follow the outlined treatment protocols.  -Discussed importance of safe, cardiovascular exercise that is started to Parkinson's.  -pt really wants to try medication for the tremor.  Understands that this may not help and understands the risk of doing so.  We will start with trihexyphenidyl, 2 mg twice per day for 2 weeks and then if tolerated, he can increase to 2 mg 3 times per day.  Discussed risk, benefits, and side effects including its potential anticholinergic side effects.  He expressed understanding.  2. Diabetes mellitus  -control is much better than previous.   States that his A1C is now 6.1 (from 14)  3.  insomnia  -PSG was ordered but states that insurance denied it.  States that it was then approved with a $250 co-pay but he does not wish to have  this done.  He has no EDS, however.  4.  Follow-up 4 to 6 months, sooner should new neurologic issues arise.  Much greater than 50% of this visit was spent in counseling and coordinating care.  Total face to face time:  25 min  Cc:  [name], NP

## 2019-06-16 ENCOUNTER — Other Ambulatory Visit: Payer: Self-pay | Admitting: Nurse Practitioner

## 2019-06-18 ENCOUNTER — Other Ambulatory Visit: Payer: Self-pay | Admitting: Unknown Physician Specialty

## 2019-06-18 ENCOUNTER — Other Ambulatory Visit: Payer: Self-pay | Admitting: Nurse Practitioner

## 2019-06-18 MED ORDER — METOPROLOL SUCCINATE ER 100 MG PO TB24: ORAL_TABLET | ORAL | 2 refills | Status: DC

## 2019-06-18 NOTE — Telephone Encounter (Signed)
Requested Prescriptions  Pending Prescriptions Disp Refills  . metoprolol succinate (TOPROL-XL) 100 MG 24 hr tablet [Pharmacy Med Name: METOPROLOL ER SUCCINATE 100MG  TABS] 28 tablet     Sig: TAKE 1 TABLET BY MOUTH ONCE DAILY WITH FOOD OR IMMEDIATELY AFTER A MEAL     Cardiovascular:  Beta Blockers Passed - 06/18/2019  2:34 PM      Passed - Last BP in normal range    BP Readings from Last 1 Encounters:  06/15/19 133/87         Passed - Last Heart Rate in normal range    Pulse Readings from Last 1 Encounters:  06/15/19 75         Passed - Valid encounter within last 6 months    Recent Outpatient Visits          1 week ago Osteoarthritis, unspecified osteoarthritis type, unspecified site   [place], Barbaraann Faster, NP   2 weeks ago Encounter for annual physical exam   [employer], [place] T, NP   1 month ago 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

## 2019-06-30 ENCOUNTER — Other Ambulatory Visit: Payer: Self-pay | Admitting: Nurse Practitioner

## 2019-06-30 ENCOUNTER — Telehealth: Payer: Self-pay

## 2019-07-02 ENCOUNTER — Telehealth: Payer: Self-pay

## 2019-07-24 ENCOUNTER — Other Ambulatory Visit: Payer: Self-pay | Admitting: Nurse Practitioner

## 2019-07-30 ENCOUNTER — Other Ambulatory Visit: Payer: Self-pay | Admitting: Nurse Practitioner

## 2019-07-30 NOTE — Telephone Encounter (Signed)
Forwarding medication refill request to PCP for review. 

## 2019-08-05 ENCOUNTER — Other Ambulatory Visit: Payer: Self-pay

## 2019-08-05 ENCOUNTER — Telehealth: Payer: Self-pay

## 2019-08-05 ENCOUNTER — Ambulatory Visit (INDEPENDENT_AMBULATORY_CARE_PROVIDER_SITE_OTHER): Payer: PRIVATE HEALTH INSURANCE | Admitting: Nurse Practitioner

## 2019-08-05 ENCOUNTER — Encounter: Payer: Self-pay | Admitting: Nurse Practitioner

## 2019-08-05 VITALS — BP 130/84 | HR 68 | Temp 98.3°F | Ht 71.0 in | Wt 268.0 lb

## 2019-08-05 DIAGNOSIS — E785 Hyperlipidemia, unspecified: Secondary | ICD-10-CM

## 2019-08-05 DIAGNOSIS — E1169 Type 2 diabetes mellitus with other specified complication: Secondary | ICD-10-CM

## 2019-08-05 DIAGNOSIS — E1129 Type 2 diabetes mellitus with other diabetic kidney complication: Secondary | ICD-10-CM

## 2019-08-05 DIAGNOSIS — R809 Proteinuria, unspecified: Secondary | ICD-10-CM | POA: Diagnosis not present

## 2019-08-05 DIAGNOSIS — E1159 Type 2 diabetes mellitus with other circulatory complications: Secondary | ICD-10-CM

## 2019-08-05 LAB — BAYER DCA HB A1C WAIVED: HB A1C (BAYER DCA - WAIVED): 5.8 % (ref <7.0)

## 2019-08-05 NOTE — Assessment & Plan Note (Signed)
With HTN and h/o open heart surgery.  Ongoing with BP at goal today and on home BP's.  Recommend continued evaluation of BP at home weekly.  Continue current medication regimen.  A1C today 5.8%.  Return in 3 months.

## 2019-08-05 NOTE — Patient Instructions (Signed)
Carbohydrate Counting for Diabetes Mellitus, Adult  Carbohydrate counting is a method of keeping track of how many carbohydrates you eat. Eating carbohydrates naturally increases the amount of sugar (glucose) in the blood. Counting how many carbohydrates you eat helps keep your blood glucose within normal limits, which helps you manage your diabetes (diabetes mellitus). It is important to know how many carbohydrates you can safely have in each meal. This is different for every person. A diet and nutrition specialist (registered dietitian) can help you make a meal plan and calculate how many carbohydrates you should have at each meal and snack. Carbohydrates are found in the following foods:  Grains, such as breads and cereals.  Dried beans and soy products.  Starchy vegetables, such as potatoes, peas, and corn.  Fruit and fruit juices.  Milk and yogurt.  Sweets and snack foods, such as cake, cookies, candy, chips, and soft drinks. How do I count carbohydrates? There are two ways to count carbohydrates in food. You can use either of the methods or a combination of both. Reading "Nutrition Facts" on packaged food The "Nutrition Facts" list is included on the labels of almost all packaged foods and beverages in the [place] It includes:  The serving size.  Information about nutrients in each serving, including the grams (g) of carbohydrate per serving. To use the "Nutrition Facts":  Decide how many servings you will have.  Multiply the number of servings by the number of carbohydrates per serving.  The resulting number is the total amount of carbohydrates that you will be having. Learning standard serving sizes of other foods When you eat carbohydrate foods that are not packaged or do not include "Nutrition Facts" on the label, you need to measure the servings in order to count the amount of carbohydrates:  Measure the foods that you will eat with a food scale or measuring cup, if needed.   Decide how many standard-size servings you will eat.  Multiply the number of servings by 15. Most carbohydrate-rich foods have about 15 g of carbohydrates per serving. ? For example, if you eat 8 oz (170 g) of strawberries, you will have eaten 2 servings and 30 g of carbohydrates (2 servings x 15 g = 30 g).  For foods that have more than one food mixed, such as soups and casseroles, you must count the carbohydrates in each food that is included. The following list contains standard serving sizes of common carbohydrate-rich foods. Each of these servings has about 15 g of carbohydrates:   hamburger bun or  English muffin.   oz (15 mL) syrup.   oz (14 g) jelly.  1 slice of bread.  1 six-inch tortilla.  3 oz (85 g) cooked rice or pasta.  4 oz (113 g) cooked dried beans.  4 oz (113 g) starchy vegetable, such as peas, corn, or potatoes.  4 oz (113 g) hot cereal.  4 oz (113 g) mashed potatoes or  of a large baked potato.  4 oz (113 g) canned or frozen fruit.  4 oz (120 mL) fruit juice.  4-6 crackers.  6 chicken nuggets.  6 oz (170 g) unsweetened dry cereal.  6 oz (170 g) plain fat-free yogurt or yogurt sweetened with artificial sweeteners.  8 oz (240 mL) milk.  8 oz (170 g) fresh fruit or one small piece of fruit.  24 oz (680 g) popped popcorn. Example of carbohydrate counting Sample meal  3 oz (85 g) chicken breast.  6 oz (170 g)   brown rice.  4 oz (113 g) corn.  8 oz (240 mL) milk.  8 oz (170 g) strawberries with sugar-free whipped topping. Carbohydrate calculation 1. Identify the foods that contain carbohydrates: ? Rice. ? Corn. ? Milk. ? Strawberries. 2. Calculate how many servings you have of each food: ? 2 servings rice. ? 1 serving corn. ? 1 serving milk. ? 1 serving strawberries. 3. Multiply each number of servings by 15 g: ? 2 servings rice x 15 g = 30 g. ? 1 serving corn x 15 g = 15 g. ? 1 serving milk x 15 g = 15 g. ? 1 serving  strawberries x 15 g = 15 g. 4. Add together all of the amounts to find the total grams of carbohydrates eaten: ? 30 g + 15 g + 15 g + 15 g = 75 g of carbohydrates total. Summary  Carbohydrate counting is a method of keeping track of how many carbohydrates you eat.  Eating carbohydrates naturally increases the amount of sugar (glucose) in the blood.  Counting how many carbohydrates you eat helps keep your blood glucose within normal limits, which helps you manage your diabetes.  A diet and nutrition specialist (registered dietitian) can help you make a meal plan and calculate how many carbohydrates you should have at each meal and snack. This information is not intended to replace advice given to you by your health care provider. Make sure you discuss any questions you have with your health care provider. Document Released: 12/03/2005 Document Revised: 06/27/2017 Document Reviewed: 05/16/2016 Elsevier Patient Education  2020 [employer]  

## 2019-08-05 NOTE — Progress Notes (Signed)
BP 130/84   Pulse 68   Temp 98.3 F (36.8 C) (Oral)   Ht 5\' 11"  (1.803 m)   Wt 268 lb (121.6 kg)   SpO2 94%   BMI 37.38 kg/m    Subjective:    Patient ID: Brian Frederick, male    DOB: November 18, 1956, 63 y.o.   MRN: [id number]  HPI: Brian Frederick is a 63 y.o. male  Chief Complaint  Patient presents with  . Diabetes  . Hypertension  . Hyperlipidemia   DIABETES Changed to Trulicity and Metformin XR on 05/13/2019 with reduction in Levemir 30-32 units.  May 2020 A1C 6.1%. Hypoglycemic episodes:no Polydipsia/polyuria: no Visual disturbance: no Chest pain: no Paresthesias: no Glucose Monitoring: yes  Accucheck frequency: Daily  Fasting glucose: 111 this morning, 110 range this week in the morning  Post prandial:  Evening:  Before meals: Taking Insulin?: yes  Long acting insulin: Levemir 30 to 32 units.  Short acting insulin: Blood Pressure Monitoring: weekly Retinal Examination: Not up to Date Foot Exam: Up to Date Pneumovax: Up to Date Influenza: Up to Date Aspirin: yes   HYPERTENSION / HYPERLIPIDEMIA Takes Lisinopril 2.5 MG daily, Metoprolol 100 MG daily, and Lipitor 20 MG. Satisfied with current treatment? yes Duration of hypertension: chronic BP monitoring frequency: weekly BP range: 120-130/80's BP medication side effects: no Duration of hyperlipidemia: chronic Cholesterol medication side effects: no Cholesterol supplements: none Medication compliance: good compliance Aspirin: yes Recent stressors: no Recurrent headaches: no Visual changes: no Palpitations: no Dyspnea: no Chest pain: no Lower extremity edema: no Dizzy/lightheaded: no  Relevant past medical, surgical, family and social history reviewed and updated as indicated. Interim medical history since our last visit reviewed. Allergies and medications reviewed and updated.  Review of Systems  Constitutional: Negative for activity change, diaphoresis, fatigue and fever.  Respiratory: Negative  for cough, chest tightness, shortness of breath and wheezing.   Cardiovascular: Negative for chest pain, palpitations and leg swelling.  Gastrointestinal: Negative for abdominal distention, abdominal pain, constipation, diarrhea, nausea and vomiting.  Endocrine: Negative for cold intolerance, heat intolerance, polydipsia, polyphagia and polyuria.  Neurological: Negative for dizziness, syncope, weakness, light-headedness, numbness and headaches.  Psychiatric/Behavioral: Negative.     Per HPI unless specifically indicated above     Objective:    BP 130/84   Pulse 68   Temp 98.3 F (36.8 C) (Oral)   Ht 5\' 11"  (1.803 m)   Wt 268 lb (121.6 kg)   SpO2 94%   BMI 37.38 kg/m   Wt Readings from Last 3 Encounters:  08/05/19 268 lb (121.6 kg)  06/15/19 268 lb 6.4 oz (121.7 kg)  06/03/19 270 lb (122.5 kg)    Physical Exam Vitals signs and nursing note reviewed.  Constitutional:      General: He is awake. He is not in acute distress.    Appearance: He is well-developed. He is obese. He is not ill-appearing.  HENT:     Head: Normocephalic and atraumatic.     Right Ear: Hearing normal. No drainage.     Left Ear: Hearing normal. No drainage.  Eyes:     General: Lids are normal.        Right eye: No discharge.        Left eye: No discharge.     Conjunctiva/sclera: Conjunctivae normal.     Pupils: Pupils are equal, round, and reactive to light.  Neck:     Musculoskeletal: Normal range of motion and neck supple.  Thyroid: No thyromegaly.     Vascular: No carotid bruit.  Cardiovascular:     Rate and Rhythm: Normal rate and regular rhythm.     Heart sounds: Normal heart sounds, S1 normal and S2 normal. No murmur. No gallop.   Pulmonary:     Effort: Pulmonary effort is normal. No accessory muscle usage or respiratory distress.     Breath sounds: Normal breath sounds.  Abdominal:     General: Bowel sounds are normal.     Palpations: Abdomen is soft.  Musculoskeletal: Normal range of  motion.     Right lower leg: No edema.     Left lower leg: No edema.  Skin:    General: Skin is warm and dry.  Neurological:     Mental Status: He is alert and oriented to person, place, and time.     Deep Tendon Reflexes: Reflexes are normal and symmetric.  Psychiatric:        Mood and Affect: Mood normal.        Behavior: Behavior normal. Behavior is cooperative.        Thought Content: Thought content normal.        Judgment: Judgment normal.     Diabetic Foot Exam - Simple   Simple Foot Form Visual Inspection No deformities, no ulcerations, no other skin breakdown bilaterally: Yes Sensation Testing See comments: Yes Pulse Check Posterior Tibialis and Dorsalis pulse intact bilaterally: Yes Comments Sensation left 7/10 and right 8/10    Results for orders placed or performed in visit on 06/03/19  Microalbumin, Urine Waived  Result Value Ref Range   Microalb, Ur Waived 10 0 - 19 mg/L   Creatinine, Urine Waived 100 10 - 300 mg/dL   Microalb/Creat Ratio <30 <30 mg/g  Vitamin B12  Result Value Ref Range   Vitamin B-12 643 232 - 1,245 pg/mL  TSH  Result Value Ref Range   TSH 2.510 0.450 - 4.500 uIU/mL  PSA  Result Value Ref Range   Prostate Specific Ag, Serum 0.2 0.0 - 4.0 ng/mL  Magnesium  Result Value Ref Range   Magnesium 1.9 1.6 - 2.3 mg/dL      Assessment & Plan:   Problem List Items Addressed This Visit      Cardiovascular and Mediastinum   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HCC)    With HTN and h/o open heart surgery.  Ongoing with BP at goal today and on home BP's.  Recommend continued evaluation of BP at home weekly.  Continue current medication regimen.  A1C today 5.8%.  Return in 3 months.        Endocrine   Hyperlipidemia associated with type 2 diabetes mellitus (HCC)    Chronic, ongoing.  Continue current medication regimen and adjust as needed.        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  Recommended he cut back on Levemir to maintain goal BS in morning < 130.  Cut back 3 units every few days and monitor.  Continue Trulicity and Metformin.  Return in 3 months.      Relevant Orders   Bayer DCA Hb A1c Waived       Follow up plan: Return in about 3 months (around 11/05/2019) for T2DM, HTN/HLD, Parkinson's.

## 2019-08-05 NOTE — Assessment & Plan Note (Signed)
Chronic, ongoing with continued reduction in A1C at 5.8% today.  Recommended he cut back on Levemir to maintain goal BS in morning < 130.  Cut back 3 units every few days and monitor.  Continue Trulicity and Metformin.  Return in 3 months.

## 2019-08-05 NOTE — Assessment & Plan Note (Signed)
Chronic, ongoing.  Continue current medication regimen and adjust as needed.   

## 2019-08-29 ENCOUNTER — Other Ambulatory Visit: Payer: Self-pay | Admitting: Nurse Practitioner

## 2019-09-02 ENCOUNTER — Other Ambulatory Visit: Payer: Self-pay

## 2019-09-02 MED ORDER — ONETOUCH DELICA PLUS LANCET33G MISC: 1.0000 | Freq: Two times a day (BID) | 5 refills | Status: DC

## 2019-09-10 ENCOUNTER — Other Ambulatory Visit: Payer: Self-pay

## 2019-09-10 MED ORDER — LEVEMIR FLEXTOUCH 100 UNIT/ML ~~LOC~~ SOPN: PEN_INJECTOR | SUBCUTANEOUS | 5 refills | Status: DC

## 2019-09-10 NOTE — Telephone Encounter (Signed)
Patient last seen 08/05/19 and has appointment 11/09/19.

## 2019-09-21 ENCOUNTER — Other Ambulatory Visit: Payer: Self-pay

## 2019-09-21 ENCOUNTER — Ambulatory Visit (INDEPENDENT_AMBULATORY_CARE_PROVIDER_SITE_OTHER): Payer: PRIVATE HEALTH INSURANCE | Admitting: Nurse Practitioner

## 2019-09-21 ENCOUNTER — Encounter: Payer: Self-pay | Admitting: Nurse Practitioner

## 2019-09-21 VITALS — BP 130/91 | HR 80 | Temp 99.3°F

## 2019-09-21 DIAGNOSIS — Z23 Encounter for immunization: Secondary | ICD-10-CM

## 2019-09-21 DIAGNOSIS — M7660 Achilles tendinitis, unspecified leg: Secondary | ICD-10-CM | POA: Insufficient documentation

## 2019-09-21 NOTE — Progress Notes (Signed)
BP (!) 130/91   Pulse 80   Temp 99.3 F (37.4 C) (Oral)   SpO2 95%    Subjective:    Patient ID: Brian Frederick, male    DOB: 10-20-1956, 63 y.o.   MRN: [id number]  HPI: Brian Frederick is a 63 y.o. male  Chief Complaint  Patient presents with  . Pain    pt states he has been having a pain on the back of his left ankle that shoots up the back of this leg for the past week    ANKLE PAIN: Little over a week ago, went walking with his wife and felt burning in left ankle.  Then got "black and blue" to outside of ankle, which has since improved.  He does not recall twisting ankle wrong way while walking with his wife.  States he feels it is improving, but he has not been resting it.  Has been up on feet mowing lawn, working, fishing.  It will get better when he rests it and then he does activities and "irritates" it.  No pain with rest, only with rigorous activity.  Tried to get to see ortho, but insurance no longer covers [facility] and would like referral to [facility]. Duration: weeks Involved foot: left Mechanism of injury: unknown Location: posterior left achilles Onset: gradual  Severity: 7/10  Quality:  aching, burning and stabbing Frequency: intermittent Radiation: from achilles up back of calf Aggravating factors: walking and stairs  Alleviating factors: ice and APAP  Status: fluctuating Treatments attempted: ice and APAP  Relief with NSAIDs?:  No NSAIDs Taken Weakness with weight bearing or walking: no Morning stiffness: no Swelling: mild Redness: no Bruising: no Paresthesias / decreased sensation: no  Fevers:no  Relevant past medical, surgical, family and social history reviewed and updated as indicated. Interim medical history since our last visit reviewed. Allergies and medications reviewed and updated.  Review of Systems  Constitutional: Negative for activity change, diaphoresis, fatigue and fever.  Respiratory: Negative for cough, chest tightness, shortness of  breath and wheezing.   Cardiovascular: Negative for chest pain, palpitations and leg swelling.  Gastrointestinal: Negative for abdominal distention, abdominal pain, constipation, diarrhea, nausea and vomiting.  Musculoskeletal: Positive for joint swelling.  Neurological: Negative for dizziness, syncope, weakness, light-headedness, numbness and headaches.  Psychiatric/Behavioral: Negative.     Per HPI unless specifically indicated above     Objective:    BP (!) 130/91   Pulse 80   Temp 99.3 F (37.4 C) (Oral)   SpO2 95%   Wt Readings from Last 3 Encounters:  08/05/19 268 lb (121.6 kg)  06/15/19 268 lb 6.4 oz (121.7 kg)  06/03/19 270 lb (122.5 kg)    Physical Exam Vitals signs and nursing note reviewed.  Constitutional:      General: He is awake. He is not in acute distress.    Appearance: He is well-developed. He is obese. He is not ill-appearing.  HENT:     Head: Normocephalic and atraumatic.     Right Ear: Hearing normal. No drainage.     Left Ear: Hearing normal. No drainage.  Eyes:     General: Lids are normal.        Right eye: No discharge.        Left eye: No discharge.     Conjunctiva/sclera: Conjunctivae normal.     Pupils: Pupils are equal, round, and reactive to light.  Neck:     Musculoskeletal: Normal range of motion and neck supple.  Vascular: No carotid bruit.  Cardiovascular:     Rate and Rhythm: Normal rate and regular rhythm.     Heart sounds: Normal heart sounds, S1 normal and S2 normal. No murmur. No gallop.   Pulmonary:     Effort: Pulmonary effort is normal. No accessory muscle usage or respiratory distress.     Breath sounds: Normal breath sounds.  Abdominal:     General: Bowel sounds are normal.     Palpations: Abdomen is soft.  Musculoskeletal: Normal range of motion.     Right ankle: He exhibits normal range of motion and no swelling. No tenderness. Achilles tendon exhibits no pain and normal Thompson's test results.     Left ankle: He  exhibits swelling. He exhibits normal range of motion, no ecchymosis, no deformity, no laceration and normal pulse. Tenderness. Achilles tendon exhibits pain. Achilles tendon exhibits normal Thompson's test results.     Right lower leg: 1+ Pitting Edema present.     Left lower leg: 1+ Pitting Edema present.     Comments: Negative Homans bilaterally.  Negative Thompson.  Compared to right edema noted to upper aspect left Achilles and ankle with 1+ edema.  No bruising or rash.  Mild tenderness to palpation of left Achilles.  Skin:    General: Skin is warm and dry.     Capillary Refill: Capillary refill takes less than 2 seconds.     Findings: No rash.  Neurological:     Mental Status: He is alert and oriented to person, place, and time.     Deep Tendon Reflexes: Reflexes are normal and symmetric.  Psychiatric:        Mood and Affect: Mood normal.        Behavior: Behavior normal. Behavior is cooperative.        Thought Content: Thought content normal.        Judgment: Judgment normal.     Results for orders placed or performed in visit on 08/05/19  Bayer DCA Hb A1c Waived  Result Value Ref Range   HB A1C (BAYER DCA - WAIVED) 5.8 <7.0 %      Assessment & Plan:   Problem List Items Addressed This Visit      Other   Pain in Achilles tendon - Primary    No trauma reported, negative [place] and [place].  Referral put through to [facility] per patient request.  Suspect tendinopathy.  Recommend ice frequently throughout daytime hours, every couple hours for 20 minutes, plus may take tylenol as needed and rub with Icy/Hot.  Recommend resting area, decrease amount of activity at this time until pain improved.  Wear ankle brace for support when up moving.  Return for worsening or continued pain.      Relevant Orders   Ambulatory referral to Orthopedics    Other Visit Diagnoses    Need for influenza vaccination       Relevant Orders   Flu Vaccine QUAD 36+ mos IM (Completed)       Follow up  plan: Return if symptoms worsen or fail to improve.

## 2019-09-21 NOTE — Assessment & Plan Note (Signed)
No trauma reported, negative [place] and [place].  Referral put through to [facility] per patient request.  Suspect tendinopathy.  Recommend ice frequently throughout daytime hours, every couple hours for 20 minutes, plus may take tylenol as needed and rub with Icy/Hot.  Recommend resting area, decrease amount of activity at this time until pain improved.  Wear ankle brace for support when up moving.  Return for worsening or continued pain.

## 2019-09-21 NOTE — Patient Instructions (Signed)

## 2019-09-25 ENCOUNTER — Other Ambulatory Visit: Payer: Self-pay | Admitting: Unknown Physician Specialty

## 2019-09-28 ENCOUNTER — Other Ambulatory Visit: Payer: Self-pay

## 2019-09-28 ENCOUNTER — Ambulatory Visit
Admission: RE | Admit: 2019-09-28 | Discharge: 2019-09-28 | Disposition: A | Discharge status: Home / Self Care | Payer: PRIVATE HEALTH INSURANCE | Source: Ambulatory Visit | Attending: Student | Admitting: Student

## 2019-09-28 ENCOUNTER — Other Ambulatory Visit (HOSPITAL_COMMUNITY): Payer: Self-pay | Admitting: Student

## 2019-09-28 ENCOUNTER — Other Ambulatory Visit: Payer: Self-pay | Admitting: Student

## 2019-09-28 DIAGNOSIS — M7989 Other specified soft tissue disorders: Secondary | ICD-10-CM | POA: Insufficient documentation

## 2019-09-28 MED ORDER — TRIHEXYPHENIDYL HCL 2 MG PO TABS: 2.0000 mg | ORAL_TABLET | Freq: Three times a day (TID) | ORAL | 3 refills | Status: DC

## 2019-09-28 NOTE — Telephone Encounter (Signed)
Requested Prescriptions   Pending Prescriptions Disp Refills  . trihexyphenidyl (ARTANE) 2 MG tablet 90 tablet 3    Sig: Take 1 tablet (2 mg total) by mouth 3 (three) times daily with meals.   Rx last filled: 06/15/19 #90 3 refills  Pt last seen:06/15/19  Follow up appt scheduled:11/23/19

## 2019-09-30 ENCOUNTER — Other Ambulatory Visit: Admission: RE | Admit: 2019-09-30 | Payer: PRIVATE HEALTH INSURANCE | Source: Ambulatory Visit

## 2019-10-02 ENCOUNTER — Ambulatory Visit: Admission: RE | Admit: 2019-10-02 | Payer: PRIVATE HEALTH INSURANCE | Source: Home / Self Care | Admitting: Podiatry

## 2019-10-02 ENCOUNTER — Encounter: Admission: RE | Payer: Self-pay | Source: Home / Self Care

## 2019-10-02 SURGERY — REPAIR, TENDON, ACHILLES
Anesthesia: Choice | Laterality: Left

## 2019-11-09 ENCOUNTER — Ambulatory Visit (INDEPENDENT_AMBULATORY_CARE_PROVIDER_SITE_OTHER): Payer: PRIVATE HEALTH INSURANCE | Admitting: Nurse Practitioner

## 2019-11-09 ENCOUNTER — Encounter: Payer: Self-pay | Admitting: Nurse Practitioner

## 2019-11-09 ENCOUNTER — Other Ambulatory Visit: Payer: Self-pay

## 2019-11-09 VITALS — BP 140/90

## 2019-11-09 DIAGNOSIS — R809 Proteinuria, unspecified: Secondary | ICD-10-CM | POA: Diagnosis not present

## 2019-11-09 DIAGNOSIS — E1159 Type 2 diabetes mellitus with other circulatory complications: Secondary | ICD-10-CM

## 2019-11-09 DIAGNOSIS — E785 Hyperlipidemia, unspecified: Secondary | ICD-10-CM

## 2019-11-09 DIAGNOSIS — E1129 Type 2 diabetes mellitus with other diabetic kidney complication: Secondary | ICD-10-CM

## 2019-11-09 DIAGNOSIS — E1169 Type 2 diabetes mellitus with other specified complication: Secondary | ICD-10-CM

## 2019-11-09 NOTE — Assessment & Plan Note (Signed)
Chronic, ongoing.  Continue current medication regimen and adjust as needed.  Lipid panel and CMP outpatient.

## 2019-11-09 NOTE — Patient Instructions (Signed)
Carbohydrate Counting for Diabetes Mellitus, Adult  Carbohydrate counting is a method of keeping track of how many carbohydrates you eat. Eating carbohydrates naturally increases the amount of sugar (glucose) in the blood. Counting how many carbohydrates you eat helps keep your blood glucose within normal limits, which helps you manage your diabetes (diabetes mellitus). It is important to know how many carbohydrates you can safely have in each meal. This is different for every person. A diet and nutrition specialist (registered dietitian) can help you make a meal plan and calculate how many carbohydrates you should have at each meal and snack. Carbohydrates are found in the following foods:  Grains, such as breads and cereals.  Dried beans and soy products.  Starchy vegetables, such as potatoes, peas, and corn.  Fruit and fruit juices.  Milk and yogurt.  Sweets and snack foods, such as cake, cookies, candy, chips, and soft drinks. How do I count carbohydrates? There are two ways to count carbohydrates in food. You can use either of the methods or a combination of both. Reading "Nutrition Facts" on packaged food The "Nutrition Facts" list is included on the labels of almost all packaged foods and beverages in the [place] It includes:  The serving size.  Information about nutrients in each serving, including the grams (g) of carbohydrate per serving. To use the "Nutrition Facts":  Decide how many servings you will have.  Multiply the number of servings by the number of carbohydrates per serving.  The resulting number is the total amount of carbohydrates that you will be having. Learning standard serving sizes of other foods When you eat carbohydrate foods that are not packaged or do not include "Nutrition Facts" on the label, you need to measure the servings in order to count the amount of carbohydrates:  Measure the foods that you will eat with a food scale or measuring cup, if needed.   Decide how many standard-size servings you will eat.  Multiply the number of servings by 15. Most carbohydrate-rich foods have about 15 g of carbohydrates per serving. ? For example, if you eat 8 oz (170 g) of strawberries, you will have eaten 2 servings and 30 g of carbohydrates (2 servings x 15 g = 30 g).  For foods that have more than one food mixed, such as soups and casseroles, you must count the carbohydrates in each food that is included. The following list contains standard serving sizes of common carbohydrate-rich foods. Each of these servings has about 15 g of carbohydrates:   hamburger bun or  English muffin.   oz (15 mL) syrup.   oz (14 g) jelly.  1 slice of bread.  1 six-inch tortilla.  3 oz (85 g) cooked rice or pasta.  4 oz (113 g) cooked dried beans.  4 oz (113 g) starchy vegetable, such as peas, corn, or potatoes.  4 oz (113 g) hot cereal.  4 oz (113 g) mashed potatoes or  of a large baked potato.  4 oz (113 g) canned or frozen fruit.  4 oz (120 mL) fruit juice.  4-6 crackers.  6 chicken nuggets.  6 oz (170 g) unsweetened dry cereal.  6 oz (170 g) plain fat-free yogurt or yogurt sweetened with artificial sweeteners.  8 oz (240 mL) milk.  8 oz (170 g) fresh fruit or one small piece of fruit.  24 oz (680 g) popped popcorn. Example of carbohydrate counting Sample meal  3 oz (85 g) chicken breast.  6 oz (170 g)   brown rice.  4 oz (113 g) corn.  8 oz (240 mL) milk.  8 oz (170 g) strawberries with sugar-free whipped topping. Carbohydrate calculation 1. Identify the foods that contain carbohydrates: ? Rice. ? Corn. ? Milk. ? Strawberries. 2. Calculate how many servings you have of each food: ? 2 servings rice. ? 1 serving corn. ? 1 serving milk. ? 1 serving strawberries. 3. Multiply each number of servings by 15 g: ? 2 servings rice x 15 g = 30 g. ? 1 serving corn x 15 g = 15 g. ? 1 serving milk x 15 g = 15 g. ? 1 serving  strawberries x 15 g = 15 g. 4. Add together all of the amounts to find the total grams of carbohydrates eaten: ? 30 g + 15 g + 15 g + 15 g = 75 g of carbohydrates total. Summary  Carbohydrate counting is a method of keeping track of how many carbohydrates you eat.  Eating carbohydrates naturally increases the amount of sugar (glucose) in the blood.  Counting how many carbohydrates you eat helps keep your blood glucose within normal limits, which helps you manage your diabetes.  A diet and nutrition specialist (registered dietitian) can help you make a meal plan and calculate how many carbohydrates you should have at each meal and snack. This information is not intended to replace advice given to you by your health care provider. Make sure you discuss any questions you have with your health care provider. Document Released: 12/03/2005 Document Revised: 06/27/2017 Document Reviewed: 05/16/2016 Elsevier Patient Education  2020 [employer]  

## 2019-11-09 NOTE — Progress Notes (Signed)
BP 140/90    Subjective:    Patient ID: Brian Frederick, male    DOB: 29-Apr-1956, 63 y.o.   MRN: [id number]  HPI: Brian Frederick is a 63 y.o. male  Chief Complaint  Patient presents with  . Diabetes  . Hyperlipidemia  . Hypertension    . This visit was completed via Doximity due to the restrictions of the COVID-19 pandemic. All issues as above were discussed and addressed. Physical exam was done as above through visual confirmation on Doximity. If it was felt that the patient should be evaluated in the office, they were directed there. The patient verbally consented to this visit. . Location of the patient: home . Location of the provider: work . Those involved with this call:  . Provider: [name], DNP . CMA: Yvonna Alanis, CMA . Front Desk/Registration: Jill Side  . Time spent on call: 15 minutes with patient face to face via video conference. More than 50% of this time was spent in counseling and coordination of care. 10 minutes total spent in review of patient's record and preparation of their chart.  . I verified patient identity using two factors (patient name and date of birth). Patient consents verbally to being seen via telemedicine visit today.    DIABETES Continues on Trulicity and Metformin XR on 05/13/2019 + Levemir 30-32 units.  August 2020 A1C 5.8%. Hypoglycemic episodes:no Polydipsia/polyuria: no Visual disturbance: no Chest pain: no Paresthesias: no Glucose Monitoring: yes  Accucheck frequency: Daily  Fasting glucose: 110-130 range  Post prandial:  Evening:  Before meals: Taking Insulin?: yes  Long acting insulin: Levemir  34-35 units  Short acting insulin: Blood Pressure Monitoring: weekly Retinal Examination: Not up to Date Foot Exam: Up to Date Pneumovax: Up to Date Influenza: Up to Date Aspirin: yes   HYPERTENSION / HYPERLIPIDEMIA Takes Lisinopril 2.5 MG daily, Metoprolol 100 MG daily, and Lipitor 20 MG. Satisfied with current  treatment? yes Duration of hypertension: chronic BP monitoring frequency: weekly BP range: 120-130/80 - 90's BP medication side effects: no Duration of hyperlipidemia: chronic Cholesterol medication side effects: no Cholesterol supplements: none Medication compliance: good compliance Aspirin: yes Recent stressors: no Recurrent headaches: no Visual changes: no Palpitations: no Dyspnea: no Chest pain: no Lower extremity edema: no Dizzy/lightheaded: no  Relevant past medical, surgical, family and social history reviewed and updated as indicated. Interim medical history since our last visit reviewed. Allergies and medications reviewed and updated.  Review of Systems  Constitutional: Negative for activity change, diaphoresis, fatigue and fever.  Respiratory: Negative for cough, chest tightness, shortness of breath and wheezing.   Cardiovascular: Negative for chest pain, palpitations and leg swelling.  Gastrointestinal: Negative for abdominal distention, abdominal pain, constipation, diarrhea, nausea and vomiting.  Endocrine: Negative for cold intolerance, heat intolerance, polydipsia, polyphagia and polyuria.  Neurological: Negative for dizziness, syncope, weakness, light-headedness, numbness and headaches.  Psychiatric/Behavioral: Negative.     Per HPI unless specifically indicated above     Objective:    BP 140/90   Wt Readings from Last 3 Encounters:  08/05/19 268 lb (121.6 kg)  06/15/19 268 lb 6.4 oz (121.7 kg)  06/03/19 270 lb (122.5 kg)    Physical Exam Vitals signs and nursing note reviewed.  Constitutional:      General: He is awake. He is not in acute distress.    Appearance: He is well-developed. He is not ill-appearing.  HENT:     Head: Normocephalic.     Right Ear: Hearing normal. No  drainage.     Left Ear: Hearing normal. No drainage.  Eyes:     General: Lids are normal.        Right eye: No discharge.        Left eye: No discharge.      Conjunctiva/sclera: Conjunctivae normal.  Neck:     Musculoskeletal: Normal range of motion.  Pulmonary:     Effort: Pulmonary effort is normal. No accessory muscle usage or respiratory distress.  Neurological:     Mental Status: He is alert and oriented to person, place, and time.  Psychiatric:        Mood and Affect: Mood normal.        Behavior: Behavior normal. Behavior is cooperative.        Thought Content: Thought content normal.        Judgment: Judgment normal.     Diabetic Foot Exam - Simple   No data filed     Results for orders placed or performed in visit on 08/05/19  Bayer DCA Hb A1c Waived  Result Value Ref Range   HB A1C (BAYER DCA - WAIVED) 5.8 <7.0 %      Assessment & Plan:   Problem List Items Addressed This Visit      Cardiovascular and Mediastinum   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[place])    With HTN and h/o open heart surgery.  Ongoing with BP at goal on home BP's.  Recommend continued evaluation of BP at home weekly.  Continue current medication regimen.  A1C recently 5.8%.  Return in 3 months. Obtain outpatient labs.        Endocrine   Hyperlipidemia associated with type 2 diabetes mellitus (HCC)    Chronic, ongoing.  Continue current medication regimen and adjust as needed.  Lipid panel and CMP outpatient.      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  Recommended he cut back on Levemir to maintain goal BS in morning < 130.  Cut back 3 units every few days and monitor.  Continue Trulicity and Metformin.  Once Covid numbers decline plan on following in office and will consider increase in Trulicity to 1.5 MG weekly and continue increase to meet goal and reduce insulin with goal to d/c insulin. Return in 3 months + obtain outpatient labs.      Relevant Orders   Bayer DCA Hb A1c  Waived   Comprehensive metabolic panel      I discussed the assessment and treatment plan with the patient. The patient was provided an opportunity to ask questions and all were answered. The patient agreed with the plan and demonstrated an understanding of the instructions.   The patient was advised to call back or seek an in-person evaluation if the symptoms worsen or if the condition fails to improve as anticipated.   I provided 15 minutes of time during this encounter.  Follow up plan: Return in about 3 months (around 02/09/2020) for T2DM, HTN/HLD, Anxiety, Parkinson's.

## 2019-11-09 NOTE — Assessment & Plan Note (Signed)
Chronic, ongoing with continued reduction in A1C at 5.8% on August labs.  Recommended he cut back on Levemir to maintain goal BS in morning < 130.  Cut back 3 units every few days and monitor.  Continue Trulicity and Metformin.  Once Covid numbers decline plan on following in office and will consider increase in Trulicity to 1.5 MG weekly and continue increase to meet goal and reduce insulin with goal to d/c insulin. Return in 3 months + obtain outpatient labs.

## 2019-11-09 NOTE — Assessment & Plan Note (Signed)
With HTN and h/o open heart surgery.  Ongoing with BP at goal on home BP's.  Recommend continued evaluation of BP at home weekly.  Continue current medication regimen.  A1C recently 5.8%.  Return in 3 months. Obtain outpatient labs.

## 2019-11-18 ENCOUNTER — Other Ambulatory Visit: Payer: PRIVATE HEALTH INSURANCE

## 2019-11-18 ENCOUNTER — Other Ambulatory Visit: Payer: Self-pay

## 2019-11-18 DIAGNOSIS — R809 Proteinuria, unspecified: Secondary | ICD-10-CM

## 2019-11-18 DIAGNOSIS — E1129 Type 2 diabetes mellitus with other diabetic kidney complication: Secondary | ICD-10-CM

## 2019-11-18 DIAGNOSIS — E1169 Type 2 diabetes mellitus with other specified complication: Secondary | ICD-10-CM

## 2019-11-18 DIAGNOSIS — E785 Hyperlipidemia, unspecified: Secondary | ICD-10-CM

## 2019-11-18 LAB — LIPID PANEL PICCOLO, WAIVED
Chol/HDL Ratio Piccolo,Waive: 3.8 mg/dL
Cholesterol Piccolo, Waived: 126 mg/dL (ref <200)
HDL Chol Piccolo, Waived: 33 mg/dL — ABNORMAL LOW (ref >59)
LDL Chol Calc Piccolo Waived: 58 mg/dL (ref <100)
Triglycerides Piccolo,Waived: 174 mg/dL — ABNORMAL HIGH (ref <150)
VLDL Chol Calc Piccolo,Waive: 35 mg/dL — ABNORMAL HIGH (ref <30)

## 2019-11-18 LAB — BAYER DCA HB A1C WAIVED: HB A1C (BAYER DCA - WAIVED): 6 % (ref <7.0)

## 2019-11-19 LAB — COMPREHENSIVE METABOLIC PANEL
ALT: 36 IU/L (ref 0–44)
AST: 26 IU/L (ref 0–40)
Albumin/Globulin Ratio: 2.2 (ref 1.2–2.2)
Albumin: 4.7 g/dL (ref 3.8–4.8)
Alkaline Phosphatase: 101 IU/L (ref 39–117)
BUN/Creatinine Ratio: 19 (ref 10–24)
BUN: 21 mg/dL (ref 8–27)
Bilirubin Total: 0.4 mg/dL (ref 0.0–1.2)
CO2: 25 mmol/L (ref 20–29)
Calcium: 9.5 mg/dL (ref 8.6–10.2)
Chloride: 101 mmol/L (ref 96–106)
Creatinine, Ser: 1.12 mg/dL (ref 0.76–1.27)
GFR calc Af Amer: 80 mL/min/{1.73_m2} (ref >59)
GFR calc non Af Amer: 69 mL/min/{1.73_m2} (ref >59)
Globulin, Total: 2.1 g/dL (ref 1.5–4.5)
Glucose: 118 mg/dL — ABNORMAL HIGH (ref 65–99)
Potassium: 4.7 mmol/L (ref 3.5–5.2)
Sodium: 139 mmol/L (ref 134–144)
Total Protein: 6.8 g/dL (ref 6.0–8.5)

## 2019-11-20 NOTE — Progress Notes (Signed)
Virtual Visit via Telephone Note [place]) The purpose of this virtual visit is to provide medical care while limiting exposure to the novel coronavirus.    Consent was obtained for phone visit:  Yes.   Answered questions that patient had about telehealth interaction:  Yes.   I discussed the limitations, risks, security and privacy concerns of performing an evaluation and management service by telephone. I also discussed with the patient that there may be a patient responsible charge related to this service. The patient expressed understanding and agreed to proceed.  Pt location: Home Physician Location: office Name of referring provider:  [name], NP I connected with .Brian Frederick at patients initiation/request on 11/23/2019 at 11:15 AM EST by telephone and verified that I am speaking with the correct person using two identifiers.  Pt MRN:  [id number] Pt DOB:  [birth year]   History of Present Illness:  Patient seen today in follow-up for Parkinson's disease.  He is on pramipexole, 1 mg 3 times per day.  No compulsive behaviors.  No sleep attacks.  Last visit, the patient really wanted to try something specifically for tremor.  I cautiously added trihexyphenidyl, 2 mg 3 times per day.  He reports that he has no side effects and it may have helped some.  It helped enough that he doesn't wish to d/c.  Pt denies falls but he did rupture his achilles tendon.  He jumped off the boat and rupture his achilles.  Pt denies lightheadedness, near syncope.  No hallucinations.  Mood has been good.   Observations/Objective:   Vitals:   11/23/19 1028  Weight: 270 lb (122.5 kg)  Height: 5\' 11"  (1.803 m)    Lab Results  Component Value Date   HGBA1C 6.0 11/18/2019     Chemistry      Component Value Date/Time   NA 139 11/18/2019 1003   NA 142 09/02/2013 0220   K 4.7 11/18/2019 1003   K 4.2 09/02/2013 0220   CL 101 11/18/2019 1003   CL 107  09/02/2013 0220   CO2 25 11/18/2019 1003   CO2 25 09/02/2013 0220   BUN 21 11/18/2019 1003   BUN 23 (H) 09/02/2013 0220   CREATININE 1.12 11/18/2019 1003   CREATININE 1.52 (H) 09/02/2013 0220      Component Value Date/Time   CALCIUM 9.5 11/18/2019 1003   CALCIUM 9.4 09/02/2013 0220   ALKPHOS 101 11/18/2019 1003   ALKPHOS 107 09/02/2013 0220   AST 26 11/18/2019 1003   AST 30 09/02/2013 0220   ALT 36 11/18/2019 1003   ALT 38 09/02/2013 0220   BILITOT 0.4 11/18/2019 1003   BILITOT 0.3 09/02/2013 0220      Assessment and Plan:   1. idiopathic PD.  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  We discussed treatment options as well as prognostic indicators.  Patient education was provided.             -We discussed that it used to be thought that levodopa would increase risk of melanoma but now it is believed that Parkinsons itself likely increases risk of melanoma. he is to get regular skin checks.               -Continue pramipexole, 1 mg tid.   no compulsive behaviors/sleep attacks. Risks, benefits, side effects  and alternative therapies were discussed.  The opportunity to ask questions was given and they were answered to the best of my ability.  The patient expressed understanding and willingness to follow the outlined treatment protocols.             -continue Artane, 2 mg 3 times per day.  2. Diabetes mellitus             -under good control now  3.  insomnia             -PSG was ordered a long time ago but states that insurance denied it.  States that it was then approved with a $250 co-pay but he does not wish to have this done.  He has no EDS, however.  Follow Up Instructions:  6 months  -I discussed the assessment and treatment plan with the patient. The patient was provided an opportunity to ask questions and all were answered. The patient agreed with the plan and demonstrated an understanding of the instructions.    The patient was advised to call back or seek an in-person evaluation if the symptoms worsen or if the condition fails to improve as anticipated.    Total Time spent in visit with the patient was:  8 min, of which 100% of the time was spent in counseling .   Pt understands and agrees with the plan of care outlined.     Alonza Bogus, DO

## 2019-11-23 ENCOUNTER — Telehealth (INDEPENDENT_AMBULATORY_CARE_PROVIDER_SITE_OTHER): Payer: PRIVATE HEALTH INSURANCE | Admitting: Neurology

## 2019-11-23 ENCOUNTER — Other Ambulatory Visit: Payer: Self-pay

## 2019-11-23 ENCOUNTER — Encounter: Payer: Self-pay | Admitting: Neurology

## 2019-11-23 VITALS — Ht 71.0 in | Wt 270.0 lb

## 2019-11-23 DIAGNOSIS — E119 Type 2 diabetes mellitus without complications: Secondary | ICD-10-CM

## 2019-11-23 DIAGNOSIS — G4700 Insomnia, unspecified: Secondary | ICD-10-CM | POA: Diagnosis not present

## 2019-11-23 DIAGNOSIS — G20 Parkinson's disease: Secondary | ICD-10-CM

## 2019-11-23 MED ORDER — PRAMIPEXOLE DIHYDROCHLORIDE 1 MG PO TABS: 1.0000 mg | ORAL_TABLET | Freq: Three times a day (TID) | ORAL | 1 refills | Status: DC

## 2019-11-27 ENCOUNTER — Other Ambulatory Visit: Payer: Self-pay

## 2019-11-27 MED ORDER — LANSOPRAZOLE 30 MG PO CPDR: 30.0000 mg | DELAYED_RELEASE_CAPSULE | Freq: Every day | ORAL | 3 refills | Status: DC

## 2019-11-27 NOTE — Telephone Encounter (Signed)
Patient last seen 11/09/19 and has appointment 02/07/20

## 2019-12-20 ENCOUNTER — Other Ambulatory Visit: Payer: Self-pay | Admitting: Nurse Practitioner

## 2020-01-26 ENCOUNTER — Other Ambulatory Visit: Payer: Self-pay | Admitting: Nurse Practitioner

## 2020-02-10 ENCOUNTER — Encounter: Payer: Self-pay | Admitting: Nurse Practitioner

## 2020-02-10 ENCOUNTER — Telehealth (INDEPENDENT_AMBULATORY_CARE_PROVIDER_SITE_OTHER): Payer: Self-pay | Admitting: Nurse Practitioner

## 2020-02-10 ENCOUNTER — Ambulatory Visit: Payer: PRIVATE HEALTH INSURANCE | Admitting: Nurse Practitioner

## 2020-02-10 VITALS — BP 138/90 | HR 81 | Wt 280.0 lb

## 2020-02-10 DIAGNOSIS — E1141 Type 2 diabetes mellitus with diabetic mononeuropathy: Secondary | ICD-10-CM

## 2020-02-10 DIAGNOSIS — Z794 Long term (current) use of insulin: Secondary | ICD-10-CM

## 2020-02-10 DIAGNOSIS — E785 Hyperlipidemia, unspecified: Secondary | ICD-10-CM

## 2020-02-10 DIAGNOSIS — E1169 Type 2 diabetes mellitus with other specified complication: Secondary | ICD-10-CM

## 2020-02-10 DIAGNOSIS — E1159 Type 2 diabetes mellitus with other circulatory complications: Secondary | ICD-10-CM

## 2020-02-10 MED ORDER — ATORVASTATIN CALCIUM 20 MG PO TABS: 20.0000 mg | ORAL_TABLET | Freq: Every day | ORAL | 3 refills | Status: DC

## 2020-02-10 MED ORDER — LISINOPRIL 5 MG PO TABS: 5.0000 mg | ORAL_TABLET | Freq: Every day | ORAL | 3 refills | Status: DC

## 2020-02-10 NOTE — Patient Instructions (Signed)
Carbohydrate Counting for Diabetes Mellitus, Adult  Carbohydrate counting is a method of keeping track of how many carbohydrates you eat. Eating carbohydrates naturally increases the amount of sugar (glucose) in the blood. Counting how many carbohydrates you eat helps keep your blood glucose within normal limits, which helps you manage your diabetes (diabetes mellitus). It is important to know how many carbohydrates you can safely have in each meal. This is different for every person. A diet and nutrition specialist (registered dietitian) can help you make a meal plan and calculate how many carbohydrates you should have at each meal and snack. Carbohydrates are found in the following foods:  Grains, such as breads and cereals.  Dried beans and soy products.  Starchy vegetables, such as potatoes, peas, and corn.  Fruit and fruit juices.  Milk and yogurt.  Sweets and snack foods, such as cake, cookies, candy, chips, and soft drinks. How do I count carbohydrates? There are two ways to count carbohydrates in food. You can use either of the methods or a combination of both. Reading "Nutrition Facts" on packaged food The "Nutrition Facts" list is included on the labels of almost all packaged foods and beverages in the [place] It includes:  The serving size.  Information about nutrients in each serving, including the grams (g) of carbohydrate per serving. To use the "Nutrition Facts":  Decide how many servings you will have.  Multiply the number of servings by the number of carbohydrates per serving.  The resulting number is the total amount of carbohydrates that you will be having. Learning standard serving sizes of other foods When you eat carbohydrate foods that are not packaged or do not include "Nutrition Facts" on the label, you need to measure the servings in order to count the amount of carbohydrates:  Measure the foods that you will eat with a food scale or measuring cup, if  needed.  Decide how many standard-size servings you will eat.  Multiply the number of servings by 15. Most carbohydrate-rich foods have about 15 g of carbohydrates per serving. ? For example, if you eat 8 oz (170 g) of strawberries, you will have eaten 2 servings and 30 g of carbohydrates (2 servings x 15 g = 30 g).  For foods that have more than one food mixed, such as soups and casseroles, you must count the carbohydrates in each food that is included. The following list contains standard serving sizes of common carbohydrate-rich foods. Each of these servings has about 15 g of carbohydrates:   hamburger bun or  English muffin.   oz (15 mL) syrup.   oz (14 g) jelly.  1 slice of bread.  1 six-inch tortilla.  3 oz (85 g) cooked rice or pasta.  4 oz (113 g) cooked dried beans.  4 oz (113 g) starchy vegetable, such as peas, corn, or potatoes.  4 oz (113 g) hot cereal.  4 oz (113 g) mashed potatoes or  of a large baked potato.  4 oz (113 g) canned or frozen fruit.  4 oz (120 mL) fruit juice.  4-6 crackers.  6 chicken nuggets.  6 oz (170 g) unsweetened dry cereal.  6 oz (170 g) plain fat-free yogurt or yogurt sweetened with artificial sweeteners.  8 oz (240 mL) milk.  8 oz (170 g) fresh fruit or one small piece of fruit.  24 oz (680 g) popped popcorn. Example of carbohydrate counting Sample meal  3 oz (85 g) chicken breast.  6 oz (170 g)   brown rice.  4 oz (113 g) corn.  8 oz (240 mL) milk.  8 oz (170 g) strawberries with sugar-free whipped topping. Carbohydrate calculation 1. Identify the foods that contain carbohydrates: ? Rice. ? Corn. ? Milk. ? Strawberries. 2. Calculate how many servings you have of each food: ? 2 servings rice. ? 1 serving corn. ? 1 serving milk. ? 1 serving strawberries. 3. Multiply each number of servings by 15 g: ? 2 servings rice x 15 g = 30 g. ? 1 serving corn x 15 g = 15 g. ? 1 serving milk x 15 g = 15 g. ? 1  serving strawberries x 15 g = 15 g. 4. Add together all of the amounts to find the total grams of carbohydrates eaten: ? 30 g + 15 g + 15 g + 15 g = 75 g of carbohydrates total. Summary  Carbohydrate counting is a method of keeping track of how many carbohydrates you eat.  Eating carbohydrates naturally increases the amount of sugar (glucose) in the blood.  Counting how many carbohydrates you eat helps keep your blood glucose within normal limits, which helps you manage your diabetes.  A diet and nutrition specialist (registered dietitian) can help you make a meal plan and calculate how many carbohydrates you should have at each meal and snack. This information is not intended to replace advice given to you by your health care provider. Make sure you discuss any questions you have with your health care provider. Document Revised: 06/27/2017 Document Reviewed: 05/16/2016 Elsevier Patient Education  2020 Elsevier Inc.  

## 2020-02-10 NOTE — Assessment & Plan Note (Signed)
Chronic, ongoing.  Continue current medication regimen and adjust as needed.  Lipid panel and CMP outpatient.

## 2020-02-10 NOTE — Assessment & Plan Note (Signed)
With HTN and h/o open heart surgery.  Ongoing with BP slightly above goal on home BP's.  Recommend continued evaluation of BP at home weekly.  Will increase Lisinopril to 5 MG daily, script sent.  A1C recently 6%.  Return in 4 weeks to assess BP.

## 2020-02-10 NOTE — Progress Notes (Addendum)
BP 138/90   Pulse 81   Wt 280 lb (127 kg)   BMI 39.05 kg/m    Subjective:    Patient ID: Brian Frederick, male    DOB: 13-Sep-1956, 63 y.o.   MRN: [id number]  HPI: Brian Frederick is a 64 y.o. male  Chief Complaint  Patient presents with  . Diabetes  . Hypertension  . Hyperlipidemia    . This visit was completed via MyChart due to the restrictions of the COVID-19 pandemic. All issues as above were discussed and addressed. Physical exam was done as above through visual confirmation on MyChart. If it was felt that the patient should be evaluated in the office, they were directed there. The patient verbally consented to this visit. . Location of the patient: home . Location of the provider: work . Those involved with this call:  . Provider: [name], DNP . CMA: Yvonna Alanis, CMA . Front Desk/Registration: Don Perking  . Time spent on call: 15 minutes with patient face to face via video conference. More than 50% of this time was spent in counseling and coordination of care. 10 minutes total spent in review of patient's record and preparation of their chart.  . I verified patient identity using two factors (patient name and date of birth). Patient consents verbally to being seen via telemedicine visit today.    DIABETES Continues on Trulicity [id number] MG and Metformin XR 1000 MG BID  + Levemir 35 units. December 2020 A1C 6%.  Was started on Gabapentin by podiatry and tolerating well. Hypoglycemic episodes:no Polydipsia/polyuria: no Visual disturbance: no Chest pain: no Paresthesias: no Glucose Monitoring: yes             Accucheck frequency: Daily             Fasting glucose: 130 to 150             Post prandial:             Evening:             Before meals: Taking Insulin?: yes             Long acting insulin: Levemir  35 units             Short acting insulin: Blood Pressure Monitoring: weekly Retinal Examination: Not up to Date Foot Exam: Up to  Date Pneumovax: Up to Date Influenza: Up to Date Aspirin: yes   HYPERTENSION / HYPERLIPIDEMIA Takes Lisinopril 2.5 MG daily, Metoprolol 100 MG daily, and Lipitor 20 MG. Satisfied with current treatment? yes Duration of hypertension: chronic BP monitoring frequency: weekly BP range: 120-130/80 - 90's BP medication side effects: no Duration of hyperlipidemia: chronic Cholesterol medication side effects: no Cholesterol supplements: none Medication compliance: good compliance Aspirin: yes Recent stressors: no Recurrent headaches: no Visual changes: no Palpitations: no Dyspnea: no Chest pain: no Lower extremity edema: no Dizzy/lightheaded: no  Relevant past medical, surgical, family and social history reviewed and updated as indicated. Interim medical history since our last visit reviewed. Allergies and medications reviewed and updated.  Review of Systems  Constitutional: Negative for activity change, diaphoresis, fatigue and fever.  Respiratory: Negative for cough, chest tightness, shortness of breath and wheezing.   Cardiovascular: Negative for chest pain, palpitations and leg swelling.  Gastrointestinal: Negative.   Endocrine: Negative for cold intolerance, heat intolerance, polydipsia, polyphagia and polyuria.  Psychiatric/Behavioral: Negative.     Per HPI unless specifically indicated above     Objective:  BP 138/90   Pulse 81   Wt 280 lb (127 kg)   BMI 39.05 kg/m   Wt Readings from Last 3 Encounters:  02/10/20 280 lb (127 kg)  11/23/19 270 lb (122.5 kg)  08/05/19 268 lb (121.6 kg)    Physical Exam Vitals and nursing note reviewed.  Constitutional:      General: He is awake. He is not in acute distress.    Appearance: He is well-developed. He is not ill-appearing.  HENT:     Head: Normocephalic.     Right Ear: Hearing normal. No drainage.     Left Ear: Hearing normal. No drainage.  Eyes:     General: Lids are normal.        Right eye: No discharge.         Left eye: No discharge.     Conjunctiva/sclera: Conjunctivae normal.  Pulmonary:     Effort: Pulmonary effort is normal. No accessory muscle usage or respiratory distress.  Musculoskeletal:     Cervical back: Normal range of motion.  Neurological:     Mental Status: He is alert and oriented to person, place, and time.  Psychiatric:        Mood and Affect: Mood normal.        Behavior: Behavior normal. Behavior is cooperative.        Thought Content: Thought content normal.        Judgment: Judgment normal.     Results for orders placed or performed in visit on 11/18/19  65m Lipid Panel  Result Value Ref Range   Cholesterol Piccolo, Waived 126 <200 mg/dL   HDL Chol Piccolo, Waived 33 (L) >59 mg/dL   Triglycerides Piccolo,Waived 174 (H) <150 mg/dL   Chol/HDL Ratio Piccolo,Waive 3.8 mg/dL   LDL Chol Calc Piccolo Waived 58 <100 mg/dL   VLDL Chol Calc Piccolo,Waive 35 (H) <30 mg/dL  Comprehensive metabolic panel  Result Value Ref Range   Glucose 118 (H) 65 - 99 mg/dL   BUN 21 8 - 27 mg/dL   Creatinine, Ser 1.12 0.76 - 1.27 mg/dL   GFR calc non Af Amer 69 >59 mL/min/1.73   GFR calc Af Amer 80 >59 mL/min/1.73   BUN/Creatinine Ratio 19 10 - 24   Sodium 139 134 - 144 mmol/L   Potassium 4.7 3.5 - 5.2 mmol/L   Chloride 101 96 - 106 mmol/L   CO2 25 20 - 29 mmol/L   Calcium 9.5 8.6 - 10.2 mg/dL   Total Protein 6.8 6.0 - 8.5 g/dL   Albumin 4.7 3.8 - 4.8 g/dL   Globulin, Total 2.1 1.5 - 4.5 g/dL   Albumin/Globulin Ratio 2.2 1.2 - 2.2   Bilirubin Total 0.4 0.0 - 1.2 mg/dL   Alkaline Phosphatase 101 39 - 117 IU/L   AST 26 0 - 40 IU/L   ALT 36 0 - 44 IU/L  Bayer DCA Hb A1c Waived  Result Value Ref Range   HB A1C (BAYER DCA - WAIVED) 6.0 <7.0 %      Assessment & Plan:   Problem List Items Addressed This Visit      Cardiovascular and Mediastinum   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  Ongoing with BP  slightly above goal on home BP's.  Recommend continued evaluation of BP at home weekly.  Will increase Lisinopril to 5 MG daily, script sent.  A1C recently 6%.  Return in 4 weeks  to assess BP.      Relevant Medications   aspirin 325 MG tablet   lisinopril (ZESTRIL) 5 MG tablet   atorvastatin (LIPITOR) 20 MG tablet   Other Relevant Orders   Bayer DCA Hb A1c Waived   Microalbumin, Urine Waived     Endocrine   Hyperlipidemia associated with type 2 diabetes mellitus (HCC)    Chronic, ongoing.  Continue current medication regimen and adjust as needed.  Lipid panel and CMP outpatient.      Relevant Medications   aspirin 325 MG tablet   lisinopril (ZESTRIL) 5 MG tablet   atorvastatin (LIPITOR) 20 MG tablet   Other Relevant Orders   Lipid Panel Piccolo, Waived   Bayer DCA Hb A1c Waived   Comprehensive metabolic panel   Diabetes mellitus (HCC)    Chronic, ongoing with recent A1C 6%, will obtain outpatient labs.  Continue Trulicity and Metformin and Levemir, adjust doses as needed based on lab findings.  Continue Gabapentin for neuropathy.  Once Covid numbers decline plan on following in office and will consider increase in Trulicity to 1.5 MG weekly and continue increase to meet goal and reduce insulin with goal to d/c insulin. Return in 4 months for annual physical.      Relevant Medications   aspirin 325 MG tablet   lisinopril (ZESTRIL) 5 MG tablet   atorvastatin (LIPITOR) 20 MG tablet   Other Relevant Orders   Bayer DCA Hb A1c Waived      I discussed the assessment and treatment plan with the patient. The patient was provided an opportunity to ask questions and all were answered. The patient agreed with the plan and demonstrated an understanding of the instructions.   The patient was advised to call back or seek an in-person evaluation if the symptoms worsen or if the condition fails to improve as anticipated.   I provided 15+ minutes of time during this encounter.  Follow up  plan: Return in about 4 weeks (around 03/09/2020) for HTN and then 4 months for annual physical.

## 2020-02-10 NOTE — Assessment & Plan Note (Addendum)
Chronic, ongoing with recent A1C 6%, will obtain outpatient labs.  Continue Trulicity and Metformin and Levemir, adjust doses as needed based on lab findings.  Continue Gabapentin for neuropathy.  Once Covid numbers decline plan on following in office and will consider increase in Trulicity to 1.5 MG weekly and continue increase to meet goal and reduce insulin with goal to d/c insulin. Return in 4 months for annual physical.

## 2020-02-15 ENCOUNTER — Other Ambulatory Visit: Payer: Self-pay

## 2020-02-15 ENCOUNTER — Other Ambulatory Visit: Payer: 59

## 2020-02-15 DIAGNOSIS — E1169 Type 2 diabetes mellitus with other specified complication: Secondary | ICD-10-CM

## 2020-02-15 DIAGNOSIS — E1159 Type 2 diabetes mellitus with other circulatory complications: Secondary | ICD-10-CM

## 2020-02-15 DIAGNOSIS — E785 Hyperlipidemia, unspecified: Secondary | ICD-10-CM

## 2020-02-15 DIAGNOSIS — E1141 Type 2 diabetes mellitus with diabetic mononeuropathy: Secondary | ICD-10-CM

## 2020-02-15 DIAGNOSIS — Z794 Long term (current) use of insulin: Secondary | ICD-10-CM

## 2020-02-15 LAB — BAYER DCA HB A1C WAIVED: HB A1C (BAYER DCA - WAIVED): 7.3 % — ABNORMAL HIGH (ref <7.0)

## 2020-02-15 LAB — MICROALBUMIN, URINE WAIVED
Creatinine, Urine Waived: 100 mg/dL (ref 10–300)
Microalb, Ur Waived: 30 mg/L — ABNORMAL HIGH (ref 0–19)
Microalb/Creat Ratio: <30 mg/g (ref <30)

## 2020-02-15 LAB — LIPID PANEL PICCOLO, WAIVED
Chol/HDL Ratio Piccolo,Waive: 3.7 mg/dL
Cholesterol Piccolo, Waived: 120 mg/dL (ref <200)
HDL Chol Piccolo, Waived: 33 mg/dL — ABNORMAL LOW (ref >59)
LDL Chol Calc Piccolo Waived: 57 mg/dL (ref <100)
Triglycerides Piccolo,Waived: 156 mg/dL — ABNORMAL HIGH (ref <150)
VLDL Chol Calc Piccolo,Waive: 31 mg/dL — ABNORMAL HIGH (ref <30)

## 2020-02-16 LAB — COMPREHENSIVE METABOLIC PANEL
ALT: 35 IU/L (ref 0–44)
AST: 27 IU/L (ref 0–40)
Albumin/Globulin Ratio: 2.2 (ref 1.2–2.2)
Albumin: 4.4 g/dL (ref 3.8–4.8)
Alkaline Phosphatase: 100 IU/L (ref 39–117)
BUN/Creatinine Ratio: 18 (ref 10–24)
BUN: 22 mg/dL (ref 8–27)
Bilirubin Total: 0.3 mg/dL (ref 0.0–1.2)
CO2: 26 mmol/L (ref 20–29)
Calcium: 9.6 mg/dL (ref 8.6–10.2)
Chloride: 100 mmol/L (ref 96–106)
Creatinine, Ser: 1.22 mg/dL (ref 0.76–1.27)
GFR calc Af Amer: 72 mL/min/{1.73_m2} (ref >59)
GFR calc non Af Amer: 62 mL/min/{1.73_m2} (ref >59)
Globulin, Total: 2 g/dL (ref 1.5–4.5)
Glucose: 116 mg/dL — ABNORMAL HIGH (ref 65–99)
Potassium: 4.9 mmol/L (ref 3.5–5.2)
Sodium: 140 mmol/L (ref 134–144)
Total Protein: 6.4 g/dL (ref 6.0–8.5)

## 2020-02-16 NOTE — Progress Notes (Signed)
Contacted via MyChart

## 2020-02-19 ENCOUNTER — Other Ambulatory Visit: Payer: Self-pay | Admitting: Nurse Practitioner

## 2020-02-23 ENCOUNTER — Other Ambulatory Visit: Payer: Self-pay

## 2020-02-23 MED ORDER — TRIHEXYPHENIDYL HCL 2 MG PO TABS: 2.0000 mg | ORAL_TABLET | Freq: Three times a day (TID) | ORAL | 5 refills | Status: DC

## 2020-02-23 NOTE — Telephone Encounter (Signed)
Rx(s) sent to pharmacy electronically.  

## 2020-03-03 ENCOUNTER — Other Ambulatory Visit: Payer: Self-pay | Admitting: Nurse Practitioner

## 2020-03-03 NOTE — Telephone Encounter (Signed)
Requested medication (s) are due for refill today: yes  Requested medication (s) are on the active medication list: yes  Last refill:  10/17/2019  Future visit scheduled: yes  Notes to clinic:  historical provider    Requested Prescriptions  Pending Prescriptions Disp Refills   ONETOUCH VERIO test strip [Pharmacy Med Name: [place] TEST ST(NEW)100'S] 200 strip     Sig: USE TO TEST BLOOD SUGAR UP TO FOUR TIMES DAILY AS DIRECTED      Endocrinology: Diabetes - Testing Supplies Passed - 03/03/2020  8:26 AM      Passed - Valid encounter within last 12 months    Recent Outpatient Visits           3 weeks ago 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[place])   [place], Jolene T, NP   3 months ago Diabetes mellitus with proteinuria ([place])   [place] Cann[place], Jolene T, NP   5 months ago Pain in Achilles tendon   [place], Jolene T, NP   7 months ago Diabetes mellitus with proteinuria ([place])   [place], Jolene T, NP   8 months ago Osteoarthritis, unspecified osteoarthritis type, unspecified site   [place], Barbaraann Faster, NP       Future Appointments             In 1 week [name], NP [facility]   In 2 weeks Ralene Bathe, MD [place]   In 3 months Cannady, Barbaraann Faster, NP [facility]

## 2020-03-07 ENCOUNTER — Other Ambulatory Visit: Payer: Self-pay | Admitting: Nurse Practitioner

## 2020-03-15 ENCOUNTER — Ambulatory Visit (INDEPENDENT_AMBULATORY_CARE_PROVIDER_SITE_OTHER): Payer: 59 | Admitting: Nurse Practitioner

## 2020-03-15 ENCOUNTER — Other Ambulatory Visit: Payer: Self-pay

## 2020-03-15 ENCOUNTER — Encounter: Payer: Self-pay | Admitting: Nurse Practitioner

## 2020-03-15 VITALS — BP 136/90 | HR 78 | Temp 98.9°F | Wt 292.0 lb

## 2020-03-15 DIAGNOSIS — R600 Localized edema: Secondary | ICD-10-CM

## 2020-03-15 DIAGNOSIS — E1159 Type 2 diabetes mellitus with other circulatory complications: Secondary | ICD-10-CM

## 2020-03-15 MED ORDER — FUROSEMIDE 20 MG PO TABS: 20.0000 mg | ORAL_TABLET | ORAL | 3 refills | Status: DC | PRN

## 2020-03-15 NOTE — Patient Instructions (Signed)
Carbohydrate Counting for Diabetes Mellitus, Adult  Carbohydrate counting is a method of keeping track of how many carbohydrates you eat. Eating carbohydrates naturally increases the amount of sugar (glucose) in the blood. Counting how many carbohydrates you eat helps keep your blood glucose within normal limits, which helps you manage your diabetes (diabetes mellitus). It is important to know how many carbohydrates you can safely have in each meal. This is different for every person. A diet and nutrition specialist (registered dietitian) can help you make a meal plan and calculate how many carbohydrates you should have at each meal and snack. Carbohydrates are found in the following foods:  Grains, such as breads and cereals.  Dried beans and soy products.  Starchy vegetables, such as potatoes, peas, and corn.  Fruit and fruit juices.  Milk and yogurt.  Sweets and snack foods, such as cake, cookies, candy, chips, and soft drinks. How do I count carbohydrates? There are two ways to count carbohydrates in food. You can use either of the methods or a combination of both. Reading "Nutrition Facts" on packaged food The "Nutrition Facts" list is included on the labels of almost all packaged foods and beverages in the [place] It includes:  The serving size.  Information about nutrients in each serving, including the grams (g) of carbohydrate per serving. To use the "Nutrition Facts":  Decide how many servings you will have.  Multiply the number of servings by the number of carbohydrates per serving.  The resulting number is the total amount of carbohydrates that you will be having. Learning standard serving sizes of other foods When you eat carbohydrate foods that are not packaged or do not include "Nutrition Facts" on the label, you need to measure the servings in order to count the amount of carbohydrates:  Measure the foods that you will eat with a food scale or measuring cup, if  needed.  Decide how many standard-size servings you will eat.  Multiply the number of servings by 15. Most carbohydrate-rich foods have about 15 g of carbohydrates per serving. ? For example, if you eat 8 oz (170 g) of strawberries, you will have eaten 2 servings and 30 g of carbohydrates (2 servings x 15 g = 30 g).  For foods that have more than one food mixed, such as soups and casseroles, you must count the carbohydrates in each food that is included. The following list contains standard serving sizes of common carbohydrate-rich foods. Each of these servings has about 15 g of carbohydrates:   hamburger bun or  English muffin.   oz (15 mL) syrup.   oz (14 g) jelly.  1 slice of bread.  1 six-inch tortilla.  3 oz (85 g) cooked rice or pasta.  4 oz (113 g) cooked dried beans.  4 oz (113 g) starchy vegetable, such as peas, corn, or potatoes.  4 oz (113 g) hot cereal.  4 oz (113 g) mashed potatoes or  of a large baked potato.  4 oz (113 g) canned or frozen fruit.  4 oz (120 mL) fruit juice.  4-6 crackers.  6 chicken nuggets.  6 oz (170 g) unsweetened dry cereal.  6 oz (170 g) plain fat-free yogurt or yogurt sweetened with artificial sweeteners.  8 oz (240 mL) milk.  8 oz (170 g) fresh fruit or one small piece of fruit.  24 oz (680 g) popped popcorn. Example of carbohydrate counting Sample meal  3 oz (85 g) chicken breast.  6 oz (170 g)   brown rice.  4 oz (113 g) corn.  8 oz (240 mL) milk.  8 oz (170 g) strawberries with sugar-free whipped topping. Carbohydrate calculation 1. Identify the foods that contain carbohydrates: ? Rice. ? Corn. ? Milk. ? Strawberries. 2. Calculate how many servings you have of each food: ? 2 servings rice. ? 1 serving corn. ? 1 serving milk. ? 1 serving strawberries. 3. Multiply each number of servings by 15 g: ? 2 servings rice x 15 g = 30 g. ? 1 serving corn x 15 g = 15 g. ? 1 serving milk x 15 g = 15 g. ? 1  serving strawberries x 15 g = 15 g. 4. Add together all of the amounts to find the total grams of carbohydrates eaten: ? 30 g + 15 g + 15 g + 15 g = 75 g of carbohydrates total. Summary  Carbohydrate counting is a method of keeping track of how many carbohydrates you eat.  Eating carbohydrates naturally increases the amount of sugar (glucose) in the blood.  Counting how many carbohydrates you eat helps keep your blood glucose within normal limits, which helps you manage your diabetes.  A diet and nutrition specialist (registered dietitian) can help you make a meal plan and calculate how many carbohydrates you should have at each meal and snack. This information is not intended to replace advice given to you by your health care provider. Make sure you discuss any questions you have with your health care provider. Document Revised: 06/27/2017 Document Reviewed: 05/16/2016 Elsevier Patient Education  2020 Elsevier Inc.  

## 2020-03-15 NOTE — Assessment & Plan Note (Signed)
Suspect related to Gabapentin, as started after taking this.  No orthopnea, increased SOB, CP, or palpitations.  Check BMP today.  Recommend he wear compression hose during daytime hours and elevate legs when possible.  Will send in Lasix to take as needed.  If ongoing will discontinue Gabapentin and switch to alternate neuropathy medication: Lyrica or Duloxetine.  Return in 2 months, sooner if worsening symptoms.

## 2020-03-15 NOTE — Progress Notes (Signed)
BP 136/90   Pulse 78   Temp 98.9 F (37.2 C) (Oral)   Wt 292 lb (132.5 kg)   SpO2 97%   BMI 40.73 kg/m    Subjective:    Patient ID: Brian Frederick, male    DOB: December 21, 1955, 64 y.o.   MRN: [id number]  HPI: Brian Frederick is a 64 y.o. male  Chief Complaint  Patient presents with  . Hypertension   HYPERTENSION Last visit increased Lisinopril to 5 MG.  Was started on Gabapentin by podiatry two months ago and this has led to BLE edema.  Is wearing compression hose at night on occasion and edema does improve with elevation. Reports Gabpentin is for burning in his feet after his recent surgery. Hypertension status: stable  Satisfied with current treatment? yes Duration of hypertension: chronic BP monitoring frequency:  rarely BP range:  BP medication side effects:  no Medication compliance: good compliance Aspirin: yes Recurrent headaches: no Visual changes: no Palpitations: no Dyspnea: occasional with exertion, no change from baseline -- sleeps on two pillows at night, baseline Chest pain: no Lower extremity edema: yes Dizzy/lightheaded: no  Relevant past medical, surgical, family and social history reviewed and updated as indicated. Interim medical history since our last visit reviewed. Allergies and medications reviewed and updated.  Review of Systems  Constitutional: Negative for activity change, diaphoresis, fatigue and fever.  Respiratory: Negative for cough, chest tightness and wheezing.   Cardiovascular: Positive for leg swelling. Negative for chest pain and palpitations.  Gastrointestinal: Negative.   Endocrine: Negative for polydipsia, polyphagia and polyuria.  Neurological: Negative.   Psychiatric/Behavioral: Negative.     Per HPI unless specifically indicated above     Objective:    BP 136/90   Pulse 78   Temp 98.9 F (37.2 C) (Oral)   Wt 292 lb (132.5 kg)   SpO2 97%   BMI 40.73 kg/m   Wt Readings from Last 3 Encounters:  03/15/20 292 lb (132.5  kg)  02/10/20 280 lb (127 kg)  11/23/19 270 lb (122.5 kg)    Physical Exam Vitals and nursing note reviewed.  Constitutional:      General: He is awake. He is not in acute distress.    Appearance: He is well-developed. He is morbidly obese. He is not ill-appearing.  HENT:     Head: Normocephalic and atraumatic.     Right Ear: Hearing normal. No drainage.     Left Ear: Hearing normal. No drainage.  Eyes:     General: Lids are normal.        Right eye: No discharge.        Left eye: No discharge.     Conjunctiva/sclera: Conjunctivae normal.     Pupils: Pupils are equal, round, and reactive to light.  Neck:     Thyroid: No thyromegaly.     Vascular: No carotid bruit.  Cardiovascular:     Rate and Rhythm: Normal rate and regular rhythm.     Heart sounds: Normal heart sounds, S1 normal and S2 normal. No murmur. No gallop.   Pulmonary:     Effort: Pulmonary effort is normal. No accessory muscle usage or respiratory distress.     Breath sounds: Normal breath sounds.  Abdominal:     General: Bowel sounds are normal. There is no distension.     Palpations: Abdomen is soft.  Musculoskeletal:        General: Normal range of motion.     Cervical back: Normal range of  motion and neck supple.     Right lower leg: 1+ Pitting Edema present.     Left lower leg: 1+ Pitting Edema present.  Skin:    General: Skin is warm and dry.  Neurological:     Mental Status: He is alert and oriented to person, place, and time.  Psychiatric:        Attention and Perception: Attention normal.        Mood and Affect: Mood normal.        Speech: Speech normal.        Behavior: Behavior normal. Behavior is cooperative.        Thought Content: Thought content normal.     Results for orders placed or performed in visit on 02/15/20  Microalbumin, Urine Waived  Result Value Ref Range   Microalb, Ur Waived 30 (H) 0 - 19 mg/L   Creatinine, Urine Waived 100 10 - 300 mg/dL   Microalb/Creat Ratio <30 <30  mg/g  Comprehensive metabolic panel  Result Value Ref Range   Glucose 116 (H) 65 - 99 mg/dL   BUN 22 8 - 27 mg/dL   Creatinine, Ser 1.22 0.76 - 1.27 mg/dL   GFR calc non Af Amer 62 >59 mL/min/1.73   GFR calc Af Amer 72 >59 mL/min/1.73   BUN/Creatinine Ratio 18 10 - 24   Sodium 140 134 - 144 mmol/L   Potassium 4.9 3.5 - 5.2 mmol/L   Chloride 100 96 - 106 mmol/L   CO2 26 20 - 29 mmol/L   Calcium 9.6 8.6 - 10.2 mg/dL   Total Protein 6.4 6.0 - 8.5 g/dL   Albumin 4.4 3.8 - 4.8 g/dL   Globulin, Total 2.0 1.5 - 4.5 g/dL   Albumin/Globulin Ratio 2.2 1.2 - 2.2   Bilirubin Total 0.3 0.0 - 1.2 mg/dL   Alkaline Phosphatase 100 39 - 117 IU/L   AST 27 0 - 40 IU/L   ALT 35 0 - 44 IU/L  Bayer DCA Hb A1c Waived  Result Value Ref Range   HB A1C (BAYER DCA - WAIVED) 7.3 (H) <7.0 %  Lipid Panel Piccolo, Waived  Result Value Ref Range   Cholesterol Piccolo, Waived 120 <200 mg/dL   HDL Chol Piccolo, Waived 33 (L) >59 mg/dL   Triglycerides Piccolo,Waived 156 (H) <150 mg/dL   Chol/HDL Ratio Piccolo,Waive 3.7 mg/dL   LDL Chol Calc Piccolo Waived 57 <100 mg/dL   VLDL Chol Calc Piccolo,Waive 31 (H) <30 mg/dL      Assessment & Plan:   Problem List Items Addressed This Visit      Cardiovascular and Mediastinum   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  Ongoing with BP slightly above goal initial check, but improved on recheck below goal.  Recommend continued evaluation of BP at home weekly.  Continue Lisinopril 5 MG daily. Check BMP today.      Relevant Medications   furosemide (LASIX) 20 MG tablet   Other Relevant Orders   Basic metabolic panel     Other   Morbid obesity ([place])    Recommend continued focus on healthy diet choices and regular physical activity (30 minutes 5 days a week).  Set small goals and timeline to achieve them.       Bilateral leg edema    Suspect related to Gabapentin, as started after taking this.   No orthopnea, increased SOB, CP, or palpitations.  Check BMP today.  Recommend he wear compression hose during daytime hours and elevate legs when possible.  Will send in Lasix to take as needed.  If ongoing will discontinue Gabapentin and switch to alternate neuropathy medication: Lyrica or Duloxetine.  Return in 2 months, sooner if worsening symptoms.          Follow up plan: Return in about 2 months (around 05/15/2020) for T2DM, HTN/HLD.

## 2020-03-15 NOTE — Assessment & Plan Note (Signed)
With HTN and h/o open heart surgery.  Ongoing with BP slightly above goal initial check, but improved on recheck below goal.  Recommend continued evaluation of BP at home weekly.  Continue Lisinopril 5 MG daily. Check BMP today.

## 2020-03-15 NOTE — Assessment & Plan Note (Signed)
Recommend continued focus on healthy diet choices and regular physical activity (30 minutes 5 days a week).  Set small goals and timeline to achieve them.

## 2020-03-16 LAB — BASIC METABOLIC PANEL
BUN/Creatinine Ratio: 20 (ref 10–24)
BUN: 22 mg/dL (ref 8–27)
CO2: 21 mmol/L (ref 20–29)
Calcium: 9.5 mg/dL (ref 8.6–10.2)
Chloride: 106 mmol/L (ref 96–106)
Creatinine, Ser: 1.09 mg/dL (ref 0.76–1.27)
GFR calc Af Amer: 82 mL/min/{1.73_m2} (ref >59)
GFR calc non Af Amer: 71 mL/min/{1.73_m2} (ref >59)
Glucose: 145 mg/dL — ABNORMAL HIGH (ref 65–99)
Potassium: 4.9 mmol/L (ref 3.5–5.2)
Sodium: 143 mmol/L (ref 134–144)

## 2020-03-16 NOTE — Progress Notes (Signed)
Contacted via MyChart

## 2020-03-21 ENCOUNTER — Ambulatory Visit: Payer: PRIVATE HEALTH INSURANCE | Admitting: Dermatology

## 2020-04-17 ENCOUNTER — Other Ambulatory Visit: Payer: Self-pay | Admitting: Nurse Practitioner

## 2020-04-17 NOTE — Telephone Encounter (Signed)
Requested Prescriptions  Pending Prescriptions Disp Refills  . TRULICITY [id number] SOPN [Pharmacy Med Name: TRULICITY 0.75MG /0.5ML SDP 0.5ML] 4 mL 0    Sig: ADMINISTER 0.75 MILLIGRAM UNDER THE SKIN 1 TIME A WEEK     Endocrinology:  Diabetes - GLP-1 Receptor Agonists Passed - 04/17/2020  1:38 PM      Passed - HBA1C is between 0 and 7.9 and within 180 days    HB A1C (BAYER DCA - WAIVED)  Date Value Ref Range Status  02/15/2020 7.3 (H) <7.0 % Final    Comment:                                          Diabetic Adult            <7.0                                       Healthy Adult        4.3 - 5.7                                                           (DCCT/NGSP) American Diabetes Association's Summary of Glycemic Recommendations for Adults with Diabetes: Hemoglobin A1c <7.0%. More stringent glycemic goals (A1c <6.0%) may further reduce complications at the cost of increased risk of hypoglycemia.          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

## 2020-04-18 ENCOUNTER — Other Ambulatory Visit: Payer: Self-pay | Admitting: Nurse Practitioner

## 2020-04-25 ENCOUNTER — Other Ambulatory Visit: Payer: Self-pay | Admitting: Nurse Practitioner

## 2020-04-27 ENCOUNTER — Other Ambulatory Visit: Payer: Self-pay

## 2020-04-27 ENCOUNTER — Ambulatory Visit (INDEPENDENT_AMBULATORY_CARE_PROVIDER_SITE_OTHER): Payer: 59 | Admitting: Dermatology

## 2020-04-27 DIAGNOSIS — I872 Venous insufficiency (chronic) (peripheral): Secondary | ICD-10-CM

## 2020-04-27 DIAGNOSIS — L814 Other melanin hyperpigmentation: Secondary | ICD-10-CM

## 2020-04-27 DIAGNOSIS — L820 Inflamed seborrheic keratosis: Secondary | ICD-10-CM

## 2020-04-27 DIAGNOSIS — L578 Other skin changes due to chronic exposure to nonionizing radiation: Secondary | ICD-10-CM

## 2020-04-27 DIAGNOSIS — Z85828 Personal history of other malignant neoplasm of skin: Secondary | ICD-10-CM | POA: Diagnosis not present

## 2020-04-27 DIAGNOSIS — L570 Actinic keratosis: Secondary | ICD-10-CM

## 2020-04-27 DIAGNOSIS — L821 Other seborrheic keratosis: Secondary | ICD-10-CM

## 2020-04-27 DIAGNOSIS — Z1283 Encounter for screening for malignant neoplasm of skin: Secondary | ICD-10-CM | POA: Diagnosis not present

## 2020-04-27 LAB — HM DIABETES EYE EXAM

## 2020-04-27 NOTE — Progress Notes (Signed)
Follow-Up Visit   Subjective  Brian Frederick is a 64 y.o. male who presents for the following: Annual Exam (TBSE, hx BCC nose, hx Aks) and check bump (scalp, x 89m, no symptoms).  The patient presents for total body skin examination for skin cancer screening and mole check.  The following portions of the chart were reviewed this encounter and updated as appropriate:  Tobacco  Allergies  Meds  Problems  Med Hx  Surg Hx  Fam Hx      Review of Systems:  No other skin or systemic complaints except as noted in HPI or Assessment and Plan.  Objective  Well appearing patient in no apparent distress; mood and affect are within normal limits.  A full examination was performed including scalp, head, eyes, ears, nose, lips, neck, chest, axillae, abdomen, back, buttocks, bilateral upper extremities, bilateral lower extremities, hands, feet, fingers, toes, fingernails, and toenails. All findings within normal limits unless otherwise noted below.  Objective  face/scalp/ears x 16 (16): Pink scaly macules   Objective  mid dorsum nose: Well healed scar with no evidence of recurrence.   Objective  R crown scalp x 1, L forearm x 2, R forearm x 2, Low back x 1, R temple x 1 (7): Erythematous keratotic or waxy stuck-on papule or plaque.   Objective  Lower legs: Erythematous, scaly patches involving the ankle and distal lower leg with associated lower leg edema.    Assessment & Plan    Hx of recent diagnosis Parkinson's disease and Diabetes.  Actinic Damage - diffuse scaly erythematous macules with underlying dyspigmentation - Recommend daily broad spectrum sunscreen SPF 30+ to sun-exposed areas, reapply every 2 hours as needed.  - Call for new or changing lesions.  Skin cancer screening performed today.  Lentigines - Scattered tan macules - Discussed due to sun exposure - Benign, observe - Call for any changes  Seborrheic Keratoses - Stuck-on, waxy, tan-brown papules and  plaques  - Discussed benign etiology and prognosis. - Observe - Call for any changes   AK (actinic keratosis) (16) face/scalp/ears x 16  Destruction of lesion - face/scalp/ears x 16 Complexity: simple   Destruction method: cryotherapy   Informed consent: discussed and consent obtained   Timeout:  patient name, date of birth, surgical site, and procedure verified Lesion destroyed using liquid nitrogen: Yes   Region frozen until ice ball extended beyond lesion: Yes   Outcome: patient tolerated procedure well with no complications   Post-procedure details: wound care instructions given    History of basal cell carcinoma (BCC) mid dorsum nose  Clear. Observe for recurrence. Call clinic for new or changing lesions.  Recommend regular skin exams, daily broad-spectrum spf 30+ sunscreen use, and photoprotection.     Inflamed seborrheic keratosis (7) R crown scalp x 1, L forearm x 2, R forearm x 2, Low back x 1, R temple x 1  Destruction of lesion - R crown scalp x 1, L forearm x 2, R forearm x 2, Low back x 1, R temple x 1 Complexity: simple   Destruction method: cryotherapy   Informed consent: discussed and consent obtained   Timeout:  patient name, date of birth, surgical site, and procedure verified Lesion destroyed using liquid nitrogen: Yes   Region frozen until ice ball extended beyond lesion: Yes   Outcome: patient tolerated procedure well with no complications   Post-procedure details: wound care instructions given    Skin cancer screening  Venous stasis dermatitis of right lower  extremity Lower legs  Benign, observe  Return in about 3 months (around 07/28/2020) for recheck ISK R crown scalp, AKs.   I, Sonya Hupman, RMA, am acting as scribe for Sarina Ser, MD . Documentation: I have reviewed the above documentation for accuracy and completeness, and I agree with the above.  Sarina Ser, MD

## 2020-04-27 NOTE — Patient Instructions (Signed)
Cryotherapy Aftercare  . Wash gently with soap and water everyday.   . Apply Vaseline and Band-Aid daily until healed.  

## 2020-05-03 IMAGING — MR MR ANKLE*L* W/O CM
5 series · 40 of 40 positions shown · non-contrast
Comparison: None.

CLINICAL DATA: Left ankle pain for 2 weeks.

EXAM:
MRI OF THE LEFT ANKLE WITHOUT CONTRAST
TECHNIQUE: Multiplanar, multisequence MR imaging of the ankle was performed. No
intravenous contrast was administered.

[Series 3: PD fat-sat · axial · left · 3.0mm · 0.50mm/px · z∈[-95,+45]mm · 9 of 36 slices shown]
[im 1/36]
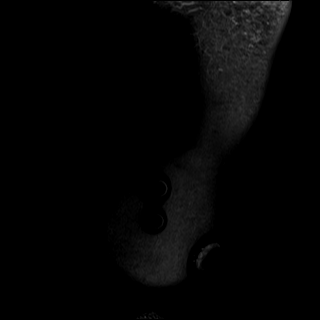
[im 5/36]
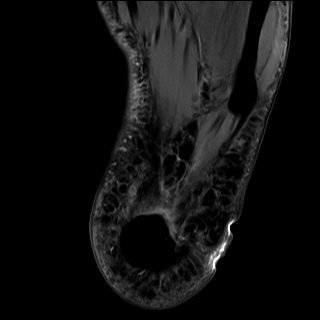
[im 9/36]
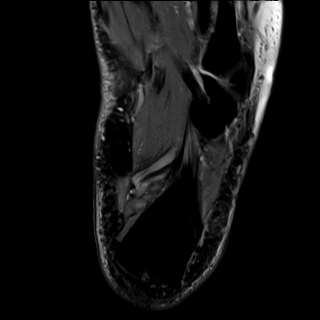
[im 14/36]
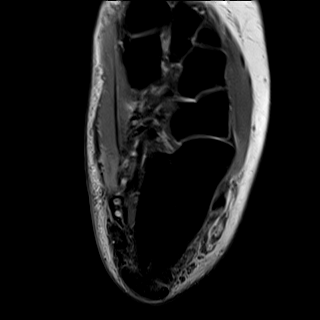
[im 18/36]
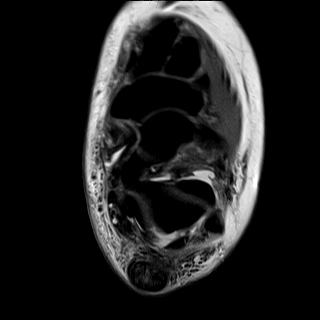
[im 22/36]
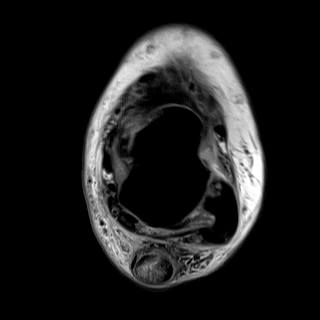
[im 27/36]
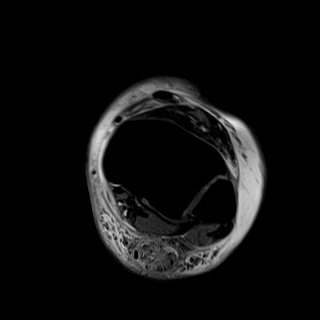
[im 31/36]
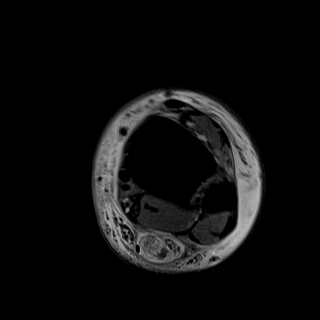
[im 36/36]
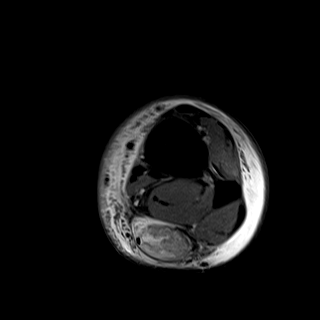

[Series 4: T2 fat-sat · axial · left · 3.0mm · 0.50mm/px · z∈[-95,+45]mm · 9 of 36 slices shown (1 of 2)]
[im 1/36]
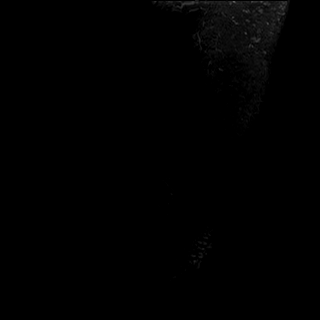
[im 5/36]
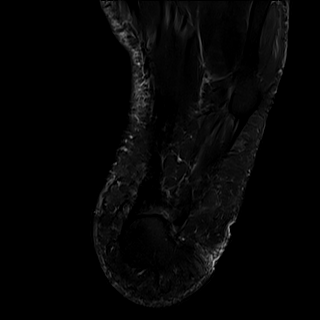
[im 9/36]
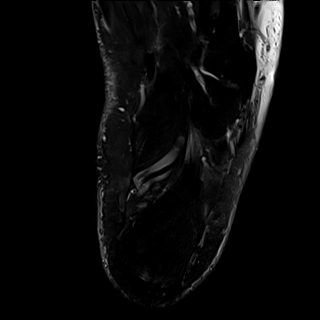
[im 14/36]
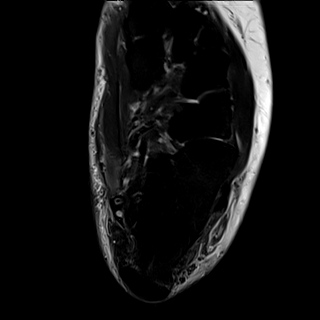
[im 18/36]
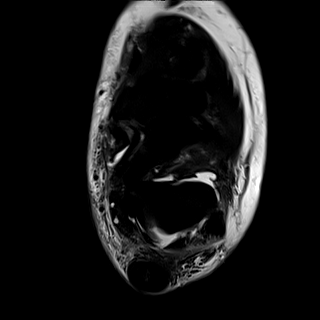
[im 22/36]
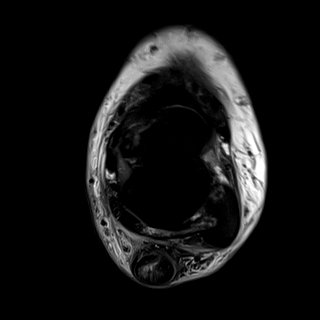
[im 27/36]
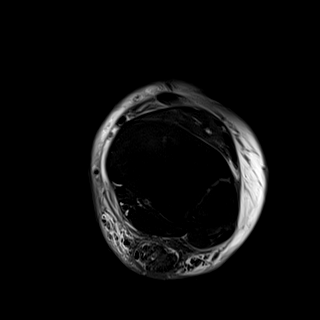
[im 31/36]
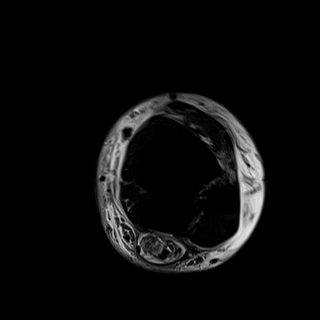
[im 36/36]
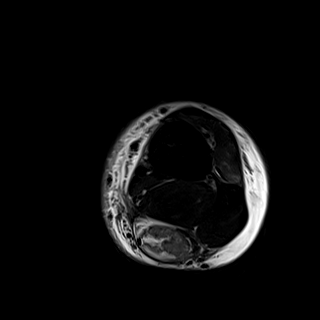

[Series 5: T2 fat-sat · coronal · left · 3.0mm · 0.62mm/px · 10 of 40 slices shown (2 of 2)]
[im 1/40]
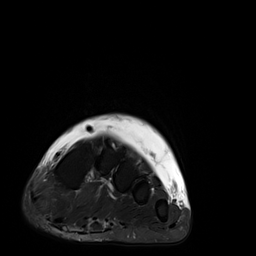
[im 5/40]
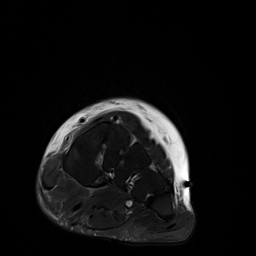
[im 9/40]
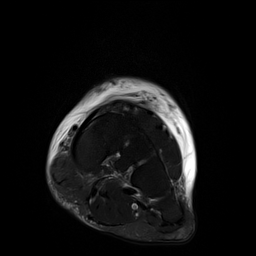
[im 14/40]
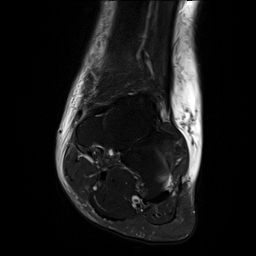
[im 18/40]
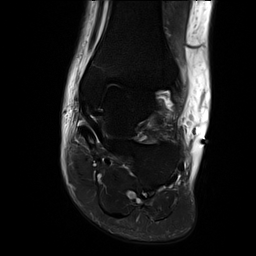
[im 22/40]
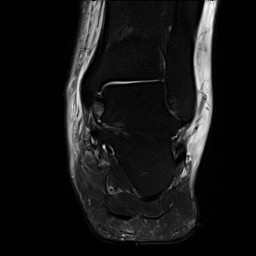
[im 27/40]
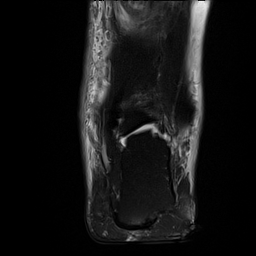
[im 31/40]
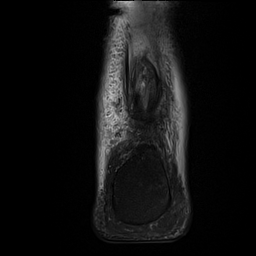
[im 35/40]
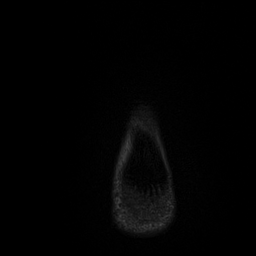
[im 40/40]
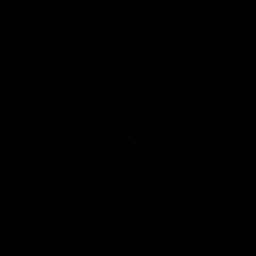

[Series 6: T1 · sagittal · left · 4.0mm · 0.70mm/px · 6 of 23 slices shown]
[im 1/23]
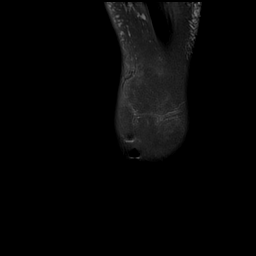
[im 5/23]
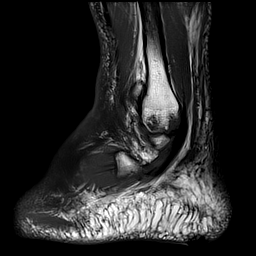
[im 9/23]
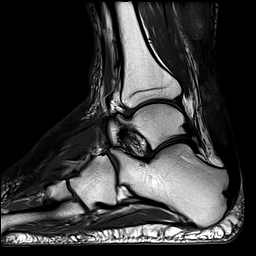
[im 14/23]
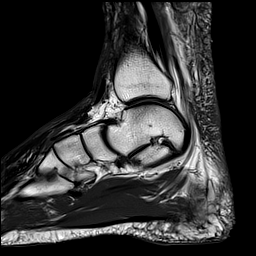
[im 18/23]
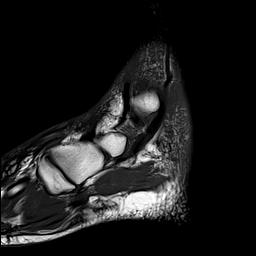
[im 23/23]
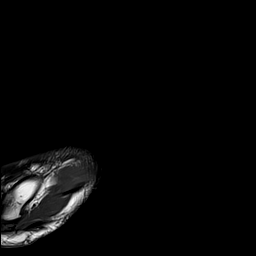

[Series 7: STIR · sagittal · left · 4.0mm · 0.35mm/px · 6 of 23 slices shown]
[im 1/23]
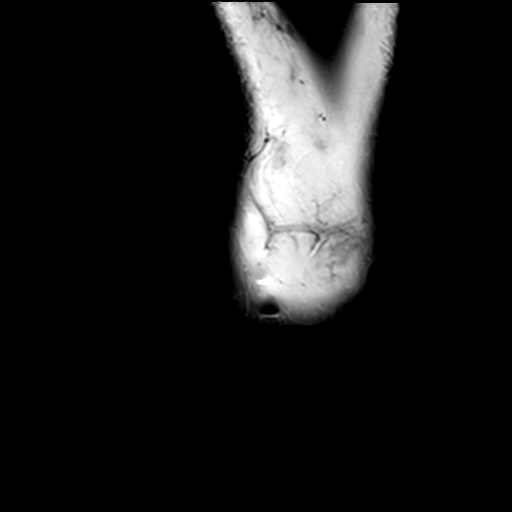
[im 5/23]
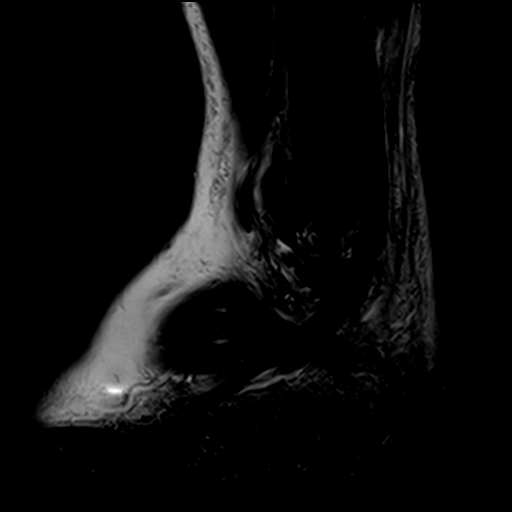
[im 9/23]
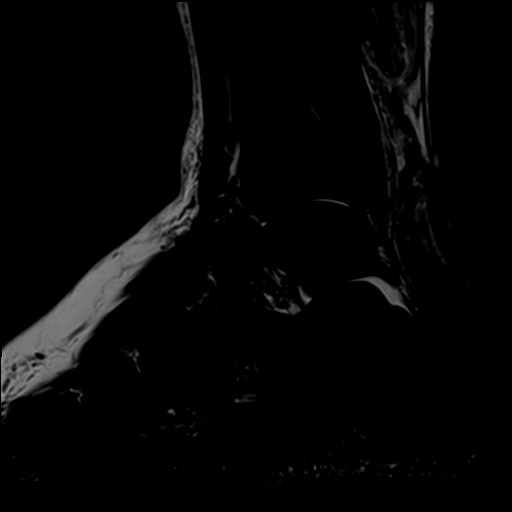
[im 14/23]
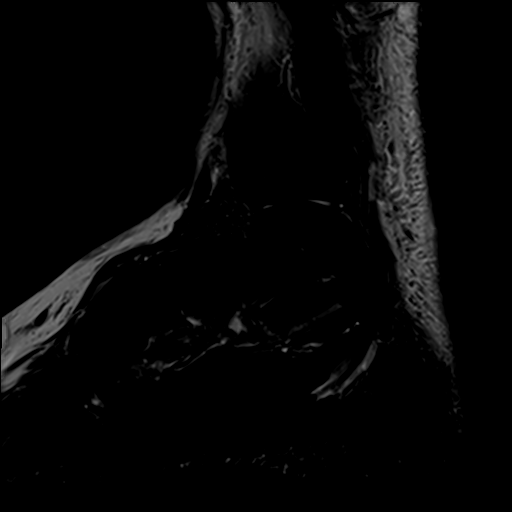
[im 18/23]
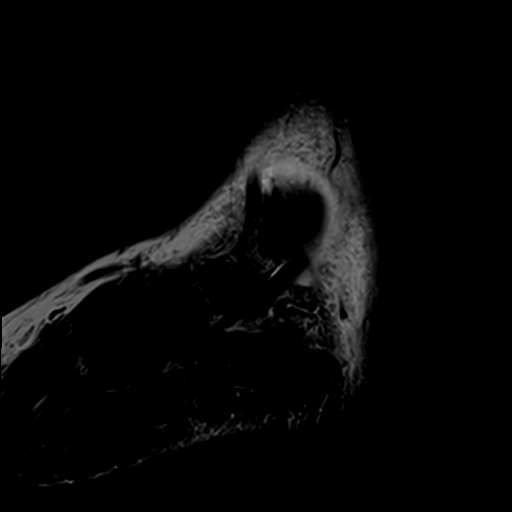
[im 23/23]
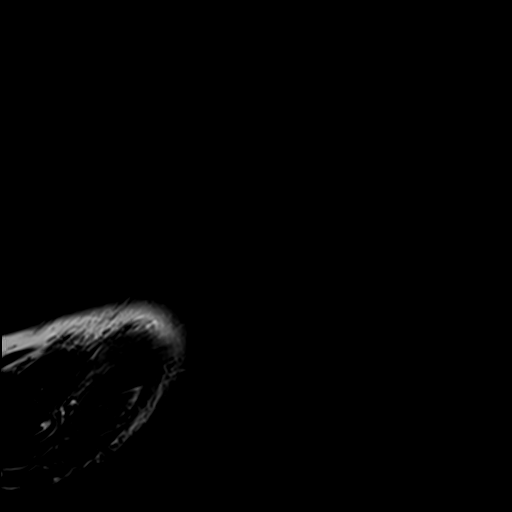

[40 of 40 positions shown; findings below may reference images not displayed]

FINDINGS: TENDONS

Peroneal: Normal.

Posteromedial: Slight tenosynovitis of the posterior tibialis tendon
just distal to the medial malleolus. Otherwise normal.

Anterior: Normal.

Achilles: There is an extensive partial rupture of the Achilles
tendon. There are a few intact fibers. The rupture centered
approximately 8.5 cm above the calcaneal insertion. There is severe
chronic Achilles tendinosis of the distal Achilles tendon.

Plantar Fascia: Normal.

LIGAMENTS

Lateral: Intact.

Medial: Intact.

CARTILAGE

Ankle Joint: Diffuse thinning of the articular cartilage. Otherwise
normal.

Subtalar Joints/Sinus Tarsi: Small posterior subtalar joint
effusion. Otherwise normal. Sinus tarsi is normal.

Bones: No significant abnormality.

Other: Extensive subcutaneous edema in the distal lower leg and
around the ankle and extending onto the dorsum of the foot.
Incidental note is made of multiple abnormalities in or on the skin
surface of the foot, particularly at the heel. Does the patient have
multiple unusual skin lesions or history of multiple of metallic
foreign bodies in or on the skin?
IMPRESSION: 1. Extensive partial rupture of the Achilles tendon with a few
intact fibers.
2. Severe chronic Achilles tendinosis.
3. Slight tenosynovitis of the posterior tibialis tendon just distal
to the medial malleolus.

## 2020-05-04 ENCOUNTER — Ambulatory Visit: Payer: 59 | Admitting: Nurse Practitioner

## 2020-05-04 ENCOUNTER — Other Ambulatory Visit: Payer: Self-pay

## 2020-05-04 ENCOUNTER — Encounter: Payer: Self-pay | Admitting: Nurse Practitioner

## 2020-05-04 ENCOUNTER — Encounter: Payer: Self-pay | Admitting: Dermatology

## 2020-05-04 VITALS — BP 138/86 | HR 88 | Temp 99.2°F | Ht 69.69 in | Wt 283.6 lb

## 2020-05-04 DIAGNOSIS — Z794 Long term (current) use of insulin: Secondary | ICD-10-CM

## 2020-05-04 DIAGNOSIS — M170 Bilateral primary osteoarthritis of knee: Secondary | ICD-10-CM | POA: Diagnosis not present

## 2020-05-04 DIAGNOSIS — E1141 Type 2 diabetes mellitus with diabetic mononeuropathy: Secondary | ICD-10-CM | POA: Diagnosis not present

## 2020-05-04 MED ORDER — GABAPENTIN 100 MG PO CAPS: 100.0000 mg | ORAL_CAPSULE | Freq: Two times a day (BID) | ORAL | 3 refills | Status: DC

## 2020-05-04 NOTE — Patient Instructions (Signed)
Knee Injection A knee injection is a procedure to get medicine into your knee joint to relieve the pain, swelling, and stiffness of arthritis. Your health care provider uses a needle to inject medicine, which may also help to lubricate and cushion your knee joint. You may need more than one injection. Tell a health care provider about:  Any allergies you have.  All medicines you are taking, including vitamins, herbs, eye drops, creams, and over-the-counter medicines.  Any problems you or family members have had with anesthetic medicines.  Any blood disorders you have.  Any surgeries you have had.  Any medical conditions you have.  Whether you are pregnant or may be pregnant. What are the risks? Generally, this is a safe procedure. However, problems may occur, including:  Infection.  Bleeding.  Symptoms that get worse.  Damage to the area around your knee.  Allergic reaction to any of the medicines.  Skin reactions from repeated injections. What happens before the procedure?  Ask your health care provider about changing or stopping your regular medicines. This is especially important if you are taking diabetes medicines or blood thinners.  Plan to have someone take you home from the hospital or clinic. What happens during the procedure?   You will sit or lie down in a position for your knee to be treated.  The skin over your kneecap will be cleaned with a germ-killing soap.  You will be given a medicine that numbs the area (local anesthetic). You may feel some stinging.  The medicine will be injected into your knee. The needle is carefully placed between your kneecap and your knee. The medicine is injected into the joint space.  The needle will be removed at the end of the procedure.  A bandage (dressing) may be placed over the injection site. The procedure may vary among health care providers and hospitals. What can I expect after the procedure?  Your blood  pressure, heart rate, breathing rate, and blood oxygen level will be monitored until you leave the hospital or clinic.  You may have to move your knee through its full range of motion. This helps to get all the medicine into your joint space.  You will be watched to make sure that you do not have a reaction to the injected medicine.  You may feel more pain, swelling, and warmth than you did before the injection. This reaction may last about 1-2 days. Follow these instructions at home: Medicines  Take over-the-counter and prescription medicines only as told by your doctor.  Do not drive or use heavy machinery while taking prescription pain medicine.  Do not take medicines such as aspirin and ibuprofen unless your health care provider tells you to take them. Injection site care  Follow instructions from your health care provider about: ? How to take care of your puncture site. ? When and how you should change your dressing. ? When you should remove your dressing.  Check your injection area every day for signs of infection. Check for: ? More redness, swelling, or pain after 2 days. ? Fluid or blood. ? Pus or a bad smell. ? Warmth. Managing pain, stiffness, and swelling   If directed, put ice on the injection area: ? Put ice in a plastic bag. ? Place a towel between your skin and the bag. ? Leave the ice on for 20 minutes, 2-3 times per day.  Do not apply heat to your knee.  Raise (elevate) the injection area above the level   of your heart while you are sitting or lying down. General instructions  If you were given a dressing, keep it dry until your health care provider says it can be removed. Ask your health care provider when you can start showering or taking a bath.  Avoid strenuous activities for as long as directed by your health care provider. Ask your health care provider when you can return to your normal activities.  Keep all follow-up visits as told by your health  care provider. This is important. You may need more injections. Contact a health care provider if you have:  A fever.  Warmth in your injection area.  Fluid, blood, or pus coming from your injection site.  Symptoms at your injection site that last longer than 2 days after your procedure. Get help right away if:  Your knee: ? Turns very red. ? Becomes very swollen. ? Is in severe pain. Summary  A knee injection is a procedure to get medicine into your knee joint to relieve the pain, swelling, and stiffness of arthritis.  A needle is carefully placed between your kneecap and your knee to inject medicine into the joint space.  Before the procedure, ask your health care provider about changing or stopping your regular medicines, especially if you are taking diabetes medicines or blood thinners.  Contact your health care provider if you have any problems or questions after your procedure. This information is not intended to replace advice given to you by your health care provider. Make sure you discuss any questions you have with your health care provider. Document Revised: 12/23/2017 Document Reviewed: 12/23/2017 Elsevier Patient Education  2020 [employer]  

## 2020-05-04 NOTE — Assessment & Plan Note (Signed)
Refill on Gabapentin sent, to return in June for T2DM visit.

## 2020-05-04 NOTE — Assessment & Plan Note (Signed)
Knee injection to left knee provided in office.  Education provided and verbal consent obtained.  To return if any s/s infection or any worsening symptoms.  Injection # 2 for the year, initial 06/09/2019 to right knee and this one continues to offer benefit.  If ongoing pain and poor response to injections OR need for more frequent injections then will have return to ortho.  Plan on repeating imaging in future to reassess OA noted in past at [facility] per patient.

## 2020-05-04 NOTE — Progress Notes (Signed)
BP 138/86 (BP Location: Left Arm, Patient Position: Sitting, Cuff Size: Normal)    Pulse 88    Temp 99.2 F (37.3 C) (Oral)    Ht 5' 9.69" (1.77 m)    Wt 283 lb 9.6 oz (128.6 kg)    SpO2 95%    BMI 41.06 kg/m    Subjective:    Patient ID: Brian Frederick, male    DOB: [birth year], 64 y.o.   MRN: [id number]  HPI: Brian Frederick is a 64 y.o. male  Chief Complaint  Patient presents with   Knee Pain    left   KNEE (LEFT) PAIN Chronic pain with flares.  Initial injection to right knee was on 06/09/19 -- tolerated well -- pain improved for several months.  Endorses foot numbness, takes Gabapentin 100 MG BID -- started by podiatry, has been out of this two weeks. Tolerates Gabapentin, would like refills on this.  He reports imaging at [facility] in past, minimal arthritis present he reports -- had over past 2 years -- insurance no longer covers this location. Duration: chronic Involved knee: right Mechanism of injury: unknown Location:anterior Onset: gradual Severity: 7/10  Quality:  dull and aching Frequency: intermittent Radiation: no Aggravating factors: walking and bending  Alleviating factors: ice and Tylenol Status: fluctuating Treatments attempted: ice and APAP  Relief with NSAIDs?:  none Weakness with weight bearing or walking: no Sensation of giving way: no Locking: no Popping: no Bruising: no Swelling: no Redness: no Paresthesias/decreased sensation: no Fevers: no    Relevant past medical, surgical, family and social history reviewed and updated as indicated. Interim medical history since our last visit reviewed. Allergies and medications reviewed and updated.  Review of Systems  Constitutional: Negative for activity change, diaphoresis, fatigue and fever.  Respiratory: Negative for cough, chest tightness, shortness of breath and wheezing.   Cardiovascular: Negative for chest pain, palpitations and leg swelling.  Gastrointestinal: Negative for abdominal  distention, abdominal pain, constipation, diarrhea, nausea and vomiting.  Musculoskeletal: Positive for arthralgias.  Skin: Negative.   Psychiatric/Behavioral: Negative.     Per HPI unless specifically indicated above     Objective:    BP 138/86 (BP Location: Left Arm, Patient Position: Sitting, Cuff Size: Normal)    Pulse 88    Temp 99.2 F (37.3 C) (Oral)    Ht 5' 9.69" (1.77 m)    Wt 283 lb 9.6 oz (128.6 kg)    SpO2 95%    BMI 41.06 kg/m   Wt Readings from Last 3 Encounters:  05/04/20 283 lb 9.6 oz (128.6 kg)  03/15/20 292 lb (132.5 kg)  02/10/20 280 lb (127 kg)    Physical Exam Vitals and nursing note reviewed.  Constitutional:      General: He is awake. He is not in acute distress.    Appearance: He is well-developed. He is not ill-appearing.  HENT:     Head: Normocephalic and atraumatic.     Right Ear: Hearing normal. No drainage.     Left Ear: Hearing normal. No drainage.     Mouth/Throat:     Pharynx: Uvula midline.  Eyes:     General: Lids are normal.        Right eye: No discharge.        Left eye: No discharge.     Conjunctiva/sclera: Conjunctivae normal.     Pupils: Pupils are equal, round, and reactive to light.  Cardiovascular:     Rate and Rhythm: Normal rate and regular  rhythm.     Heart sounds: Normal heart sounds, S1 normal and S2 normal. No murmur. No gallop.   Pulmonary:     Effort: Pulmonary effort is normal. No accessory muscle usage or respiratory distress.     Breath sounds: Normal breath sounds.  Abdominal:     General: Bowel sounds are normal.     Palpations: Abdomen is soft.  Musculoskeletal:     Cervical back: Normal range of motion and neck supple.     Right knee: Crepitus present. No swelling, deformity, erythema or lacerations. Normal range of motion. No tenderness.     Left knee: Crepitus present. No swelling or lacerations. Normal range of motion. Tenderness (on palpation anterior aspect) present.     Right lower leg: No edema.      Left lower leg: No edema.  Skin:    General: Skin is warm and dry.     Capillary Refill: Capillary refill takes less than 2 seconds.     Findings: No rash.  Neurological:     Mental Status: He is alert and oriented to person, place, and time.  Psychiatric:        Mood and Affect: Mood normal.        Behavior: Behavior normal. Behavior is cooperative.        Thought Content: Thought content normal.        Judgment: Judgment normal.    STEROID INJECTION  Procedure: Left Knee Intraarticular Steroid Injection  Description:After verbal consent and patient education on injection, area prepped and draped using  semi-sterile technique. Using a lateral  approach, a mixture of 4 cc of  1% Marcaine & 1 cc of Kenalog 40 was injected into knee joint.  A bandage was then placed over the injection site. Complications:  none Post Procedure Instructions: To the ER if any symptoms of erythema or swelling.   Follow Up: PRN  Results for orders placed or performed in visit on 04/29/20  HM DIABETES EYE EXAM  Result Value Ref Range   HM Diabetic Eye Exam No Retinopathy No Retinopathy      Assessment & Plan:   Problem List Items Addressed This Visit      Endocrine   Diabetes mellitus ([place])    Refill on Gabapentin sent, to return in June for T2DM visit.        Musculoskeletal and Integument   Osteoarthritis of knee - Primary    Knee injection to left knee provided in office.  Education provided and verbal consent obtained.  To return if any s/s infection or any worsening symptoms.  Injection # 2 for the year, initial 06/09/2019 to right knee and this one continues to offer benefit.  If ongoing pain and poor response to injections OR need for more frequent injections then will have return to ortho.  Plan on repeating imaging in future to reassess OA noted in past at [facility] per patient.          Follow up plan: Return for as scheduled.

## 2020-05-17 ENCOUNTER — Other Ambulatory Visit: Payer: Self-pay

## 2020-05-17 ENCOUNTER — Ambulatory Visit (INDEPENDENT_AMBULATORY_CARE_PROVIDER_SITE_OTHER): Payer: 59 | Admitting: Nurse Practitioner

## 2020-05-17 ENCOUNTER — Encounter: Payer: Self-pay | Admitting: Nurse Practitioner

## 2020-05-17 VITALS — BP 128/72 | HR 78 | Temp 98.4°F | Wt 290.0 lb

## 2020-05-17 DIAGNOSIS — E1141 Type 2 diabetes mellitus with diabetic mononeuropathy: Secondary | ICD-10-CM | POA: Diagnosis not present

## 2020-05-17 DIAGNOSIS — Z6838 Body mass index (BMI) 38.0-38.9, adult: Secondary | ICD-10-CM | POA: Insufficient documentation

## 2020-05-17 DIAGNOSIS — F419 Anxiety disorder, unspecified: Secondary | ICD-10-CM

## 2020-05-17 DIAGNOSIS — E1169 Type 2 diabetes mellitus with other specified complication: Secondary | ICD-10-CM

## 2020-05-17 DIAGNOSIS — G20 Parkinson's disease: Secondary | ICD-10-CM | POA: Diagnosis not present

## 2020-05-17 DIAGNOSIS — E1159 Type 2 diabetes mellitus with other circulatory complications: Secondary | ICD-10-CM

## 2020-05-17 DIAGNOSIS — Z794 Long term (current) use of insulin: Secondary | ICD-10-CM

## 2020-05-17 DIAGNOSIS — Z6841 Body Mass Index (BMI) 40.0 and over, adult: Secondary | ICD-10-CM

## 2020-05-17 DIAGNOSIS — E785 Hyperlipidemia, unspecified: Secondary | ICD-10-CM

## 2020-05-17 LAB — BAYER DCA HB A1C WAIVED: HB A1C (BAYER DCA - WAIVED): 7.5 % — ABNORMAL HIGH (ref <7.0)

## 2020-05-17 MED ORDER — TRULICITY 1.5 MG/0.5ML ~~LOC~~ SOAJ: 1.5000 mg | SUBCUTANEOUS | 5 refills | Status: DC

## 2020-05-17 NOTE — Progress Notes (Signed)
BP 128/72    Pulse 78    Temp 98.4 F (36.9 C) (Oral)    Wt 290 lb (131.5 kg)    SpO2 94%    BMI 41.99 kg/m    Subjective:    Patient ID: Brian Frederick, male    DOB: Aug 15, 1956, 64 y.o.   MRN: [id number]  HPI: Brian Frederick is a 64 y.o. male  Chief Complaint  Patient presents with   Diabetes   Hyperlipidemia   Hypertension   DIABETES Continues onTrulicity [id number] MG and Metformin XR 1000 MG BID +Levemir 35 units. March 2021 7.3%.  Continues on Gabapentin by podiatry and tolerating well, with exception of some mild edema to BLE. Hypoglycemic episodes:no Polydipsia/polyuria: no Visual disturbance: no Chest pain: no Paresthesias: no Glucose Monitoring: yes Accucheck frequency: Daily Fasting glucose: 140-150 Post prandial: Evening: 300 recently after indulging at a cook out Before meals: Taking Insulin?: yes Long acting insulin: Levemir 35 units Short acting insulin: Blood Pressure Monitoring: weekly Retinal Examination: Not up to Date Foot Exam: Up to Date Pneumovax: Up to Date Influenza: Up to Date Aspirin: yes  HYPERTENSION / HYPERLIPIDEMIA Takes Lisinopril 5 MG daily, Metoprolol 100 MG daily, and Lipitor 20 MG. Satisfied with current treatment? yes Duration of hypertension: chronic BP monitoring frequency: weekly BP range: 120-130/80- 90's BP medication side effects: no Duration of hyperlipidemia: chronic Cholesterol medication side effects: no Cholesterol supplements: none Medication compliance: good compliance Aspirin: yes Recent stressors: no Recurrent headaches:no Visual changes: no Palpitations: no Dyspnea: no Chest pain: no Lower extremity edema: occasional -- is taking Lasix at this time once a day -- started when took Gabapentin Dizzy/lightheaded: no  PARKINSON'S DISEASE: Diagnosed in 2015.  Sees Dr. Carles Collet, returns to see next Tuesday.  Denies any recent  falls or decline in function.  Reports good control on current regimen of Mirapex and Artane.    ANXIETY/STRESS On Lexapro 10 MG daily. Duration:controlled Anxious mood: no  Excessive worrying: no Irritability: no  Sweating: no Nausea: no Palpitations:no Hyperventilation: no Panic attacks: no Agoraphobia: no  Obscessions/compulsions: no Depressed mood: no Anhedonia: no Weight changes: no Insomnia: yes hard to stay asleep  Hypersomnia: no Fatigue/loss of energy: no Feelings of worthlessness: no Feelings of guilt: no Impaired concentration/indecisiveness: no Suicidal ideations: no  Crying spells: no Recent Stressors/Life Changes: no   Relationship problems: no   Family stress: no     Financial stress: no    Job stress: no    Recent death/loss: no  Depression screen [facility] 2/9 05/17/2020 06/03/2019 05/05/2019 09/10/2018 05/02/2018  Decreased Interest 0 0 0 0 0  Down, Depressed, Hopeless 0 0 0 0 0  PHQ - 2 Score 0 0 0 0 0  Altered sleeping 0 2 3 - 0  Tired, decreased energy 0 0 0 - 1  Change in appetite 0 0 0 - 0  Feeling bad or failure about yourself  0 0 0 - 0  Trouble concentrating 0 0 0 - 0  Moving slowly or fidgety/restless 0 0 0 - 0  Suicidal thoughts 0 0 0 - 0  PHQ-9 Score 0 2 3 - 1  Difficult doing work/chores - - Not difficult at all - -   GAD 7 : Generalized Anxiety Score 05/17/2020 06/03/2019 05/05/2019  Nervous, Anxious, on Edge 0 0 0  Control/stop worrying 0 0 0  Worry too much - different things 0 0 0  Trouble relaxing 0 0 0  Restless 0 0 0  Easily annoyed  or irritable 0 0 0  Afraid - awful might happen 0 0 0  Total GAD 7 Score 0 0 0  Anxiety Difficulty Not difficult at all Not difficult at all Not difficult at all    Relevant past medical, surgical, family and social history reviewed and updated as indicated. Interim medical history since our last visit reviewed. Allergies and medications reviewed and updated.  Review of Systems  Constitutional: Negative  for activity change, diaphoresis, fatigue and fever.  Respiratory: Negative for cough, chest tightness, shortness of breath and wheezing.   Cardiovascular: Negative for chest pain, palpitations and leg swelling.  Gastrointestinal: Negative.   Endocrine: Negative for polydipsia, polyphagia and polyuria.  Neurological: Negative.   Psychiatric/Behavioral: Negative.    Per HPI unless specifically indicated above     Objective:    BP 128/72    Pulse 78    Temp 98.4 F (36.9 C) (Oral)    Wt 290 lb (131.5 kg)    SpO2 94%    BMI 41.99 kg/m   Wt Readings from Last 3 Encounters:  05/17/20 290 lb (131.5 kg)  05/04/20 283 lb 9.6 oz (128.6 kg)  03/15/20 292 lb (132.5 kg)    Physical Exam Vitals and nursing note reviewed.  Constitutional:      General: He is awake. He is not in acute distress.    Appearance: He is well-developed and well-groomed. He is morbidly obese. He is not ill-appearing.  HENT:     Head: Normocephalic and atraumatic.     Right Ear: Hearing normal. No drainage.     Left Ear: Hearing normal. No drainage.  Eyes:     General: Lids are normal.        Right eye: No discharge.        Left eye: No discharge.     Conjunctiva/sclera: Conjunctivae normal.     Pupils: Pupils are equal, round, and reactive to light.  Neck:     Thyroid: No thyromegaly.     Vascular: No carotid bruit.  Cardiovascular:     Rate and Rhythm: Normal rate and regular rhythm.     Heart sounds: Normal heart sounds, S1 normal and S2 normal. No murmur. No gallop.   Pulmonary:     Effort: Pulmonary effort is normal. No accessory muscle usage or respiratory distress.     Breath sounds: Normal breath sounds.  Abdominal:     General: Bowel sounds are normal.     Palpations: Abdomen is soft.  Musculoskeletal:        General: Normal range of motion.     Cervical back: Normal range of motion and neck supple.     Right lower leg: 1+ Edema present.     Left lower leg: 1+ Edema present.  Skin:    General:  Skin is warm and dry.     Capillary Refill: Capillary refill takes less than 2 seconds.  Neurological:     Mental Status: He is alert and oriented to person, place, and time.  Psychiatric:        Attention and Perception: Attention normal.        Mood and Affect: Mood normal.        Speech: Speech normal.        Behavior: Behavior normal. Behavior is cooperative.        Thought Content: Thought content normal.    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  Ongoing with BP at goal today in office and on home readings.  Recommend continued evaluation of BP at home weekly.  Continue Lisinopril 5 MG daily and Metoprolol 100 MG daily. Check BMP next visit.  Return as scheduled at end of month.      Relevant Medications   Dulaglutide (TRULICITY) 1.5 0000000 SOPN     Endocrine   Hyperlipidemia associated with type 2 diabetes mellitus (HCC)    Chronic, ongoing.  Continue current medication regimen and adjust as needed.  Lipid panel next visit.  Return as scheduled at end of month.      Relevant Medications   Dulaglutide (TRULICITY) 1.5 0000000 SOPN   Diabetes mellitus ([place]) - Primary    Chronic, ongoing with A1C 7.5% today.  Increase Trulicity to 1.5 MG weekly, script sent, and continue Metformin and Levemir at current doses, adjust doses as needed based on lab findings.  Continue Gabapentin for neuropathy. Will consider increase in Trulicity to 3 MG weekly and continue increase to meet goal and reduce insulin with goal to d/c insulin. Return as scheduled at end of month.      Relevant Medications   Dulaglutide (TRULICITY) 1.5 0000000 SOPN   Other Relevant Orders    Bayer DCA Hb A1c Waived     Nervous and Auditory   Parkinson's disease ([place])    Followed by neurology, continue collaboration and current medication regimen as prescribed by them.        Other   Morbid obesity ([place])    With T2DM and HTN.  Recommended eating smaller high protein, low fat meals more frequently and exercising 30 mins a day 5 times a week with a goal of 10-15lb weight loss in the next 3 months. Patient voiced their understanding and motivation to adhere to these recommendations.       Relevant Medications   Dulaglutide (TRULICITY) 1.5 0000000 SOPN   Chronic anxiety    Stable on Lexapro.  Continue low dose medication and monitor.  Denies SI/HI.      BMI 40.0-44.9, adult [facility])    Refer to morbid obesity plan of care with focus on healthy diet and exercise changes.      Relevant Medications   Dulaglutide (TRULICITY) 1.5 0000000 SOPN       Follow up plan: Return for as scheduled at end of month for physical.

## 2020-05-17 NOTE — Patient Instructions (Signed)

## 2020-05-17 NOTE — Assessment & Plan Note (Signed)
Refer to morbid obesity plan of care with focus on healthy diet and exercise changes. 

## 2020-05-17 NOTE — Assessment & Plan Note (Signed)
With HTN and h/o open heart surgery.  Ongoing with BP at goal today in office and on home readings.  Recommend continued evaluation of BP at home weekly.  Continue Lisinopril 5 MG daily and Metoprolol 100 MG daily. Check BMP next visit.  Return as scheduled at end of month.

## 2020-05-17 NOTE — Assessment & Plan Note (Signed)
With T2DM and HTN.  Recommended eating smaller high protein, low fat meals more frequently and exercising 30 mins a day 5 times a week with a goal of 10-15lb weight loss in the next 3 months. Patient voiced their understanding and motivation to adhere to these recommendations. ° °

## 2020-05-17 NOTE — Assessment & Plan Note (Addendum)
Chronic, ongoing with A1C 7.5% today.  Increase Trulicity to 1.5 MG weekly, script sent, and continue Metformin and Levemir at current doses, adjust doses as needed based on lab findings.  Continue Gabapentin for neuropathy. Will consider increase in Trulicity to 3 MG weekly and continue increase to meet goal and reduce insulin with goal to d/c insulin. Return as scheduled at end of month.

## 2020-05-17 NOTE — Assessment & Plan Note (Signed)
Followed by neurology, continue collaboration and current medication regimen as prescribed by them.

## 2020-05-17 NOTE — Assessment & Plan Note (Signed)
Stable on Lexapro.  Continue low dose medication and monitor.  Denies SI/HI. 

## 2020-05-17 NOTE — Assessment & Plan Note (Signed)
Chronic, ongoing.  Continue current medication regimen and adjust as needed.  Lipid panel next visit.  Return as scheduled at end of month.

## 2020-05-19 ENCOUNTER — Other Ambulatory Visit: Payer: Self-pay | Admitting: Nurse Practitioner

## 2020-05-19 NOTE — Telephone Encounter (Signed)
Requested Prescriptions  Pending Prescriptions Disp Refills  . LEVEMIR FLEXTOUCH 100 UNIT/ML FlexPen [Pharmacy Med Name: LEVEMIR FLEX TOUCH PEN INJ 3ML] 15 mL 5    Sig: ADMINISTER 35 UNITS UNDER THE SKIN AT BEDTIME     Endocrinology:  Diabetes - Insulins Passed - 05/19/2020  5:51 PM      Passed - HBA1C is between 0 and 7.9 and within 180 days    HB A1C (BAYER DCA - WAIVED)  Date Value Ref Range Status  05/17/2020 7.5 (H) <7.0 % Final    Comment:                                          Diabetic Adult            <7.0                                       Healthy Adult        4.3 - 5.7                                                           (DCCT/NGSP) American Diabetes Association's Summary of Glycemic Recommendations for Adults with Diabetes: Hemoglobin A1c <7.0%. More stringent glycemic goals (A1c <6.0%) may further reduce complications at the cost of increased risk of hypoglycemia.          

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
## 2020-05-24 ENCOUNTER — Ambulatory Visit (INDEPENDENT_AMBULATORY_CARE_PROVIDER_SITE_OTHER): Payer: 59 | Admitting: Neurology

## 2020-05-24 ENCOUNTER — Other Ambulatory Visit: Payer: Self-pay

## 2020-05-24 ENCOUNTER — Encounter: Payer: Self-pay | Admitting: Neurology

## 2020-05-24 VITALS — BP 140/88 | HR 81 | Resp 18 | Ht 69.0 in | Wt 288.0 lb

## 2020-05-24 DIAGNOSIS — G4710 Hypersomnia, unspecified: Secondary | ICD-10-CM

## 2020-05-24 DIAGNOSIS — G20 Parkinson's disease: Secondary | ICD-10-CM

## 2020-05-24 MED ORDER — CARBIDOPA-LEVODOPA 25-100 MG PO TABS
1.0000 | ORAL_TABLET | Freq: Three times a day (TID) | ORAL | 1 refills | Status: DC
Start: 2020-05-24 — End: 2020-11-15

## 2020-05-24 MED ORDER — PRAMIPEXOLE DIHYDROCHLORIDE 0.5 MG PO TABS: 0.5000 mg | ORAL_TABLET | Freq: Three times a day (TID) | ORAL | 1 refills | Status: DC

## 2020-05-24 NOTE — Patient Instructions (Signed)
Week 1: Decrease pramipexole to 1mg : 1/2 tablet in the AM, continue 1 tablet in the afternoon and 1 in the evening Start carbidopa/levodopa 25/100, 1/2 tablet three times per day  Week 2: Decrease pramipexole to 1mg :  1/2 in the AM, 1/2 in the afternoon and 1 in the evening Increase carbidopa/levodopa 25/100, 1/2 in the AM, 1/2 in the afternoon, 1 in the evening  Week 3: Fill pramipexole 0.5 mg and take 1 tablet three times per day Increase carbidopa/levodopa 25/100, 1/2 in the AM, 1 in the afternoon and 1 in the evening  Week 4: Continue pramipexole 0.5 mg, 1 tablet at 7am/11am/4pm Take carbidopa/levodopa 25/100, 1 tablet at 7am/11am/4pm

## 2020-06-10 ENCOUNTER — Ambulatory Visit (INDEPENDENT_AMBULATORY_CARE_PROVIDER_SITE_OTHER): Payer: 59 | Admitting: Nurse Practitioner

## 2020-06-10 ENCOUNTER — Other Ambulatory Visit: Payer: Self-pay | Admitting: Nurse Practitioner

## 2020-06-10 ENCOUNTER — Other Ambulatory Visit: Payer: Self-pay

## 2020-06-10 ENCOUNTER — Encounter: Payer: Self-pay | Admitting: Nurse Practitioner

## 2020-06-10 VITALS — BP 118/78 | HR 79 | Temp 98.5°F | Ht 70.5 in | Wt 286.4 lb

## 2020-06-10 DIAGNOSIS — Z6841 Body Mass Index (BMI) 40.0 and over, adult: Secondary | ICD-10-CM

## 2020-06-10 DIAGNOSIS — Z0000 Encounter for general adult medical examination without abnormal findings: Secondary | ICD-10-CM | POA: Diagnosis not present

## 2020-06-10 DIAGNOSIS — E1169 Type 2 diabetes mellitus with other specified complication: Secondary | ICD-10-CM

## 2020-06-10 DIAGNOSIS — Z125 Encounter for screening for malignant neoplasm of prostate: Secondary | ICD-10-CM | POA: Diagnosis not present

## 2020-06-10 DIAGNOSIS — E669 Obesity, unspecified: Secondary | ICD-10-CM

## 2020-06-10 DIAGNOSIS — E1159 Type 2 diabetes mellitus with other circulatory complications: Secondary | ICD-10-CM

## 2020-06-10 DIAGNOSIS — K219 Gastro-esophageal reflux disease without esophagitis: Secondary | ICD-10-CM

## 2020-06-10 DIAGNOSIS — E785 Hyperlipidemia, unspecified: Secondary | ICD-10-CM

## 2020-06-10 DIAGNOSIS — G20 Parkinson's disease: Secondary | ICD-10-CM | POA: Diagnosis not present

## 2020-06-10 DIAGNOSIS — F419 Anxiety disorder, unspecified: Secondary | ICD-10-CM

## 2020-06-10 DIAGNOSIS — G20A1 Parkinson's disease without dyskinesia, without mention of fluctuations: Secondary | ICD-10-CM

## 2020-06-10 NOTE — Assessment & Plan Note (Signed)
Refer to morbid obesity plan of care with focus on healthy diet and exercise changes.

## 2020-06-10 NOTE — Assessment & Plan Note (Signed)
With T2DM and HTN.  Recommended eating smaller high protein, low fat meals more frequently and exercising 30 mins a day 5 times a week with a goal of 10-15lb weight loss in the next 3 months. Patient voiced their understanding and motivation to adhere to these recommendations. ° °

## 2020-06-10 NOTE — Assessment & Plan Note (Signed)
Chronic, ongoing.  Continue current medication regimen and adjust as needed.  Lipid panel today, recent LDL 57.

## 2020-06-10 NOTE — Assessment & Plan Note (Signed)
Stable on Lexapro.  Continue low dose medication and monitor.  Denies SI/HI. 

## 2020-06-10 NOTE — Assessment & Plan Note (Signed)
Chronic, ongoing with A1C 7.5% recent visit and Trulicity increased to 1.5 MG weekly, to continue Metformin and Levemir at current doses, adjust doses as needed based on lab findings.  Continue Gabapentin for neuropathy. Will consider increase in Trulicity to 3 MG weekly and continue increase to meet goal and reduce insulin with goal to d/c insulin. Return in September for A1C check.  Highly recommend he focus heavily at home on diet changes as has poor diet compliance.

## 2020-06-10 NOTE — Assessment & Plan Note (Signed)
Followed by neurology, continue collaboration and current medication regimen as prescribed by them.  Recent note reviewed.  °

## 2020-06-10 NOTE — Patient Instructions (Signed)

## 2020-06-10 NOTE — Assessment & Plan Note (Addendum)
With HTN and h/o open heart surgery.  Ongoing with BP at goal today in office and on home readings.  Recommend continued evaluation of BP at home three times a week and document for provider + focus heavily on diet changes.  Continue Lisinopril 5 MG daily and Metoprolol 100 MG daily. Check CMP and TSH today.  Return in 3 months.

## 2020-06-10 NOTE — Assessment & Plan Note (Signed)
Chronic, stable. Continue current medication regimen and adjust as needed.  Mag level today. 

## 2020-06-10 NOTE — Progress Notes (Signed)
BP 118/78 (BP Location: Left Arm)   Pulse 79   Temp 98.5 F (36.9 C) (Oral)   Ht 5' 10.5" (1.791 m)   Wt 286 lb 6.4 oz (129.9 kg)   SpO2 96%   BMI 40.51 kg/m    Subjective:    Patient ID: Brian Frederick, male    DOB: October 14, 1956, 64 y.o.   MRN: [id number]  HPI: Brian Frederick is a 64 y.o. male presenting on 06/10/2020 for comprehensive medical examination. Current medical complaints include:none  He currently lives with: wife Interim Problems from his last visit: no   DIABETES Taking Metformin 1000 MG BID, Levemir 35 units, and Trulicity 1.5 MG.  June R4B 6.3% and Trulicity was increased to 1.5 MG.  Does endorse waking up in middle of night often to have a snack. Hypoglycemic episodes:no Polydipsia/polyuria: no Visual disturbance: no Chest pain: no Paresthesias: no Glucose Monitoring: yes             Accucheck frequency: Daily             Fasting glucose: With initial change to Trulicity 845-364 and then 160-170 when snacks in middle of night             Post prandial:             Evening:             Before meals: Taking Insulin?: yes             Long acting insulin: Levemir 35 units             Short acting insulin: Blood Pressure Monitoring: weekly Retinal Examination: Up to Date Foot Exam: Up to Date Pneumovax: Up to Date Influenza: Up to Date Aspirin: yes   HYPERTENSION / HYPERLIPIDEMIA Takes Lisinopril 2.5 MG daily, Metoprolol 100 MG daily, Lasix 20 MG as needed for edema (due to Gabapentin use), and Lipitor 20 MG + ASA. Satisfied with current treatment? yes Duration of hypertension: chronic BP monitoring frequency: weekly BP range: 120-130/80's range often at home BP medication side effects: no Duration of hyperlipidemia: chronic Cholesterol medication side effects: no Cholesterol supplements: none Medication compliance: good compliance Aspirin: yes Recent stressors: no Recurrent headaches: no Visual changes: no Palpitations: no Dyspnea: no Chest  pain: no Lower extremity edema: occasional Dizzy/lightheaded: no   PARKINSON"S DISEASE: Diagnosed in 2015.  Sees Dr. Carles Collet. Denies any recent falls or decline in function.  Reports good control on current regimen.  Saw Dr. Carles Collet last on 05/24/20 and Pramipexole decreased to 0.5 MG TID and Sinemet started 25/100 -- working to 1 tablet TID, to continue Trihexyphenidyl 2 MG TID. Goes to dermatology once a year.  ANXIETY/STRESS On Lexapro 10 MG daily. Duration:controlled Anxious mood: no  Excessive worrying: no Irritability: no  Sweating: no Nausea: no Palpitations:no Hyperventilation: no Panic attacks: no Agoraphobia: no  Obscessions/compulsions: no Depressed mood: no Anhedonia: no Weight changes: no Insomnia: yes hard to stay asleep  Hypersomnia: no Fatigue/loss of energy: no Feelings of worthlessness: no Feelings of guilt: no Impaired concentration/indecisiveness: no Suicidal ideations: no  Crying spells: no Recent Stressors/Life Changes: no   Relationship problems: no   Family stress: no     Financial stress: no    Job stress: no    Recent death/loss: no Depression screen [facility] 2/9 05/24/2020 05/17/2020 06/03/2019 05/05/2019 09/10/2018  Decreased Interest 0 0 0 0 0  Down, Depressed, Hopeless 0 0 0 0 0  PHQ - 2 Score 0 0 0  0 0  Altered sleeping - 0 2 3 -  Tired, decreased energy - 0 0 0 -  Change in appetite - 0 0 0 -  Feeling bad or failure about yourself  - 0 0 0 -  Trouble concentrating - 0 0 0 -  Moving slowly or fidgety/restless - 0 0 0 -  Suicidal thoughts - 0 0 0 -  PHQ-9 Score - 0 2 3 -  Difficult doing work/chores - - - Not difficult at all -    GERD On Prevacid 30 MG daily. GERD control status: stable  Satisfied with current treatment? yes Heartburn frequency:  Medication side effects: no  Medication compliance: stable Dysphagia: no Odynophagia:  no Hematemesis: no Blood in stool: no EGD: no   Functional Status Survey: Is the patient deaf or have  difficulty hearing?: No Does the patient have difficulty seeing, even when wearing glasses/contacts?: No Does the patient have difficulty concentrating, remembering, or making decisions?: No Does the patient have difficulty walking or climbing stairs?: No Does the patient have difficulty dressing or bathing?: No Does the patient have difficulty doing errands alone such as visiting a doctor's office or shopping?: No  FALL RISK: Fall Risk  06/10/2020 05/24/2020 11/23/2019 06/15/2019 06/03/2019  Falls in the past year? 0 0 0 0 0  Number falls in past yr: 0 0 0 0 -  Injury with Fall? 0 0 0 0 -  Follow up - - - - Falls evaluation completed    Advanced Directives <no information>  Past Medical History:  Past Medical History:  Diagnosis Date  . Achilles tendon disorder   . Anxiety   . Basal cell carcinoma 06/23/2008   Mid dorsum nose. Ulcerated.  [name] Kitchen BPH (benign prostatic hypertrophy)   . Cancer ([place])    basal cell  . Chronic kidney disease   . Hyperlipidemia   . Hypertension   . Hypogonadism in male   . Osteoarthritis   . Prediabetes   . Squamous cell carcinoma of skin 10/27/2013   Right distal lateral thigh. SCCis  . SVT (supraventricular tachycardia) [facility])     Surgical History:  Past Surgical History:  Procedure Laterality Date  . BASAL CELL CARCINOMA EXCISION    . cardio oblation    . CHOLECYSTECTOMY    . COLONOSCOPY WITH PROPOFOL N/A 04/12/2017   Procedure: COLONOSCOPY WITH PROPOFOL;  Surgeon: [name], MD;  Location: [place];  Service: Endoscopy;  Laterality: N/A;  . JOINT REPLACEMENT Left    shoulder  . JOINT REPLACEMENT Right    shoulder  . POLYPECTOMY N/A 04/12/2017   Procedure: POLYPECTOMY;  Surgeon: [name], MD;  Location: [place];  Service: Endoscopy;  Laterality: N/A;  . SHOULDER SURGERY Right 11/28/2018    Medications:  Current Outpatient Medications on File Prior to Visit  Medication Sig  . aspirin 81 MG tablet Take 81 mg by  mouth daily.  [name] Kitchen atorvastatin (LIPITOR) 20 MG tablet Take 1 tablet (20 mg total) by mouth daily.  . BD PEN NEEDLE NANO U/F 32G X 4 MM MISC USE 1 PEN NEEDLE EVERY MORNING  . blood glucose meter kit and supplies KIT Dispense based on patient and insurance preference. Use up to four times daily as directed. (FOR ICD-9 250.00, 250.01).  . carbidopa-levodopa (SINEMET IR) 25-100 MG tablet Take 1 tablet by mouth 3 (three) times daily.  . Dulaglutide (TRULICITY) 1.5 TD/3.2KG SOPN Inject 1.5 mg into the skin once a week.  . escitalopram (LEXAPRO) 10  MG tablet TAKE 1 TABLET BY MOUTH EVERY DAY  . furosemide (LASIX) 20 MG tablet Take 1 tablet (20 mg total) by mouth as needed for edema (take once a day as needed for bilateral lower extremity edema).  . gabapentin (NEURONTIN) 100 MG capsule Take 1 capsule (100 mg total) by mouth 2 (two) times daily.  . Lancets (ONETOUCH DELICA PLUS PRFFMB84Y) MISC 1 each by Other route 2 (two) times daily.  . lansoprazole (PREVACID) 30 MG capsule Take 1 capsule (30 mg total) by mouth daily at 12 noon.  [name] Kitchen LEVEMIR FLEXTOUCH 100 UNIT/ML FlexPen ADMINISTER 35 UNITS UNDER THE SKIN AT BEDTIME  . lisinopril (ZESTRIL) 5 MG tablet Take 1 tablet (5 mg total) by mouth daily.  . meloxicam (MOBIC) 15 MG tablet Take 1 tablet (15 mg total) by mouth daily. (Patient taking differently: Take 15 mg by mouth daily as needed for pain. )  . metFORMIN (GLUCOPHAGE-XR) 500 MG 24 hr tablet TAKE 2 TABLETS(1000 MG) BY MOUTH TWICE DAILY.  . metoprolol succinate (TOPROL-XL) 100 MG 24 hr tablet TAKE 1 TABLET BY MOUTH ONCE DAILY WITH FOOD OR IMMEDIATELY AFTER A MEAL  . Multiple Vitamin (MULTIVITAMIN) tablet Take 1 tablet by mouth daily.  Glory Rosebush VERIO test strip USE TO TEST BLOOD SUGAR UP TO FOUR TIMES DAILY AS DIRECTED  . trihexyphenidyl (ARTANE) 2 MG tablet Take 1 tablet (2 mg total) by mouth 3 (three) times daily with meals.  . pramipexole (MIRAPEX) 0.5 MG tablet Take 1 tablet (0.5 mg total) by mouth 3  (three) times daily. (Patient not taking: Reported on 06/10/2020)   No current facility-administered medications on file prior to visit.    Allergies:  Allergies  Allergen Reactions  . Amoxil [Amoxicillin] Swelling    Did it involve swelling of the face/tongue/throat, SOB, or low BP? No Did it involve sudden or severe rash/hives, skin peeling, or any reaction on the inside of your mouth or nose? No Did you need to seek medical attention at a hospital or doctor's office? No When did it last happen?15 years ago If all above answers are "NO", may proceed with cephalosporin use.   [name] Kitchen Penicillin G Benzathine Swelling    Did it involve swelling of the face/tongue/throat, SOB, or low BP? No Did it involve sudden or severe rash/hives, skin peeling, or any reaction on the inside of your mouth or nose? No Did you need to seek medical attention at a hospital or doctor's office? No When did it last happen?15 years ago If all above answers are "NO", may proceed with cephalosporin use.     Social History:  Social History   Socioeconomic History  . Marital status: Married    Spouse name: Not on file  . Number of children: 3  . Years of education: Not on file  . Highest education level: Associate degree: occupational, Hotel manager, or vocational program  Occupational History  . Occupation: retired    Comment: Dealer  Tobacco Use  . Smoking status: Never Smoker  . Smokeless tobacco: Never Used  Vaping Use  . Vaping Use: Never used  Substance and Sexual Activity  . Alcohol use: No    Alcohol/week: 0.0 standard drinks  . Drug use: No  . Sexual activity: Yes  Other Topics Concern  . Not on file  Social History Narrative   Married lives with spouse   Has 3 kids biological 1 step child   Right handed   Drinks coffee 2 cups a day, sweet tea with meals,  2-3 soda's a day   Social Determinants of Health   Financial Resource Strain:   . Difficulty of Paying Living Expenses:     Food Insecurity:   . Worried About Charity fundraiser in the Last Year:   . Arboriculturist in the Last Year:   Transportation Needs:   . Film/video editor (Medical):   [name] Kitchen Lack of Transportation (Non-Medical):   Physical Activity:   . Days of Exercise per Week:   . Minutes of Exercise per Session:   Stress:   . Feeling of Stress :   Social Connections:   . Frequency of Communication with Friends and Family:   . Frequency of Social Gatherings with Friends and Family:   . Attends Religious Services:   . Active Member of Clubs or Organizations:   . Attends Archivist Meetings:   [name] Kitchen Marital Status:   Intimate Partner Violence:   . Fear of Current or Ex-Partner:   . Emotionally Abused:   [name] Kitchen Physically Abused:   . Sexually Abused:    Social History   Tobacco Use  Smoking Status Never Smoker  Smokeless Tobacco Never Used   Social History   Substance and Sexual Activity  Alcohol Use No  . Alcohol/week: 0.0 standard drinks    Family History:  Family History  Problem Relation Age of Onset  . Cancer Mother        breast  . Heart disease Father 87       CABG  . Diabetes Father   . CAD Father   . Hyperlipidemia Father   . Hypertension Father   . Cancer Sister        breast  . Diabetes Paternal Grandmother   . Hypothyroidism Sister   . Stroke Maternal Grandfather     Past medical history, surgical history, medications, allergies, family history and social history reviewed with patient today and changes made to appropriate areas of the chart.   Review of Systems - negative All other ROS negative except what is listed above and in the HPI.      Objective:    BP 118/78 (BP Location: Left Arm)   Pulse 79   Temp 98.5 F (36.9 C) (Oral)   Ht 5' 10.5" (1.791 m)   Wt 286 lb 6.4 oz (129.9 kg)   SpO2 96%   BMI 40.51 kg/m   Wt Readings from Last 3 Encounters:  06/10/20 286 lb 6.4 oz (129.9 kg)  05/24/20 288 lb (130.6 kg)  05/17/20 290 lb (131.5 kg)     Physical Exam Vitals and nursing note reviewed.  Constitutional:      General: He is awake. He is not in acute distress.    Appearance: He is well-developed. He is morbidly obese. He is not ill-appearing.  HENT:     Head: Normocephalic and atraumatic.     Right Ear: Hearing, tympanic membrane, ear canal and external ear normal. No drainage.     Left Ear: Hearing, tympanic membrane, ear canal and external ear normal. No drainage.     Nose: Nose normal.     Mouth/Throat:     Pharynx: Uvula midline.  Eyes:     General: Lids are normal.        Right eye: No discharge.        Left eye: No discharge.     Extraocular Movements: Extraocular movements intact.     Conjunctiva/sclera: Conjunctivae normal.     Pupils: Pupils are equal, round,  and reactive to light.     Visual Fields: Right eye visual fields normal and left eye visual fields normal.  Neck:     Thyroid: No thyromegaly.     Vascular: No carotid bruit or JVD.     Trachea: Trachea normal.  Cardiovascular:     Rate and Rhythm: Normal rate and regular rhythm.     Heart sounds: Normal heart sounds, S1 normal and S2 normal. No murmur heard.  No gallop.   Pulmonary:     Effort: Pulmonary effort is normal. No accessory muscle usage or respiratory distress.     Breath sounds: Normal breath sounds.  Abdominal:     General: Bowel sounds are normal.     Palpations: Abdomen is soft. There is no hepatomegaly or splenomegaly.     Tenderness: There is no abdominal tenderness.  Musculoskeletal:        General: Normal range of motion.     Cervical back: Normal range of motion and neck supple.     Right lower leg: No edema.     Left lower leg: No edema.  Lymphadenopathy:     Head:     Right side of head: No submental, submandibular, tonsillar, preauricular or posterior auricular adenopathy.     Left side of head: No submental, submandibular, tonsillar, preauricular or posterior auricular adenopathy.     Cervical: No cervical adenopathy.   Skin:    General: Skin is warm and dry.     Capillary Refill: Capillary refill takes less than 2 seconds.     Findings: No rash.  Neurological:     Mental Status: He is alert and oriented to person, place, and time.     Cranial Nerves: Cranial nerves are intact.     Gait: Gait is intact.     Deep Tendon Reflexes: Reflexes are normal and symmetric.     Reflex Scores:      Brachioradialis reflexes are 2+ on the right side and 2+ on the left side.      Patellar reflexes are 2+ on the right side and 2+ on the left side. Psychiatric:        Attention and Perception: Attention normal.        Mood and Affect: Mood normal.        Speech: Speech normal.        Behavior: Behavior normal. Behavior is cooperative.        Thought Content: Thought content normal.        Cognition and Memory: Cognition normal.        Judgment: Judgment normal.    Diabetic Foot Exam - Simple   Simple Foot Form Visual Inspection See comments: Yes Sensation Testing See comments: Yes Pulse Check Posterior Tibialis and Dorsalis pulse intact bilaterally: Yes Comments Trace edema bilaterally.  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  Ongoing with BP at goal today in office and on home readings.  Recommend continued evaluation of BP at home three times a week and document for provider + focus heavily  on diet changes.  Continue Lisinopril 5 MG daily and Metoprolol 100 MG daily. Check CMP and TSH today.  Return in 3 months.      Relevant Orders   TSH     Digestive   GERD (gastroesophageal reflux disease)    Chronic, stable.  Continue current medication  regimen and adjust as needed.  Mag level today.      Relevant Orders   Magnesium     Endocrine   Hyperlipidemia associated with type 2 diabetes mellitus (HCC)    Chronic, ongoing.  Continue current medication regimen and adjust as needed.  Lipid panel today, recent LDL 57.        Relevant Orders   Comprehensive metabolic panel   Lipid Panel w/o Chol/HDL Ratio   Type 2 diabetes mellitus with obesity (HCC)    Chronic, ongoing with A1C 7.5% recent visit and Trulicity increased to 1.5 MG weekly, to continue Metformin and Levemir at current doses, adjust doses as needed based on lab findings.  Continue Gabapentin for neuropathy. Will consider increase in Trulicity to 3 MG weekly and continue increase to meet goal and reduce insulin with goal to d/c insulin. Return in September for A1C check.  Highly recommend he focus heavily at home on diet changes as has poor diet compliance.      Relevant Orders   CBC with Differential/Platelet     Nervous and Auditory   Parkinson's disease ([place])    Followed by neurology, continue collaboration and current medication regimen as prescribed by them.  Recent note reviewed.        Other   Morbid obesity ([place])    With T2DM and HTN.  Recommended eating smaller high protein, low fat meals more frequently and exercising 30 mins a day 5 times a week with a goal of 10-15lb weight loss in the next 3 months. Patient voiced their understanding and motivation to adhere to these recommendations.       Chronic anxiety    Stable on Lexapro.  Continue low dose medication and monitor.  Denies SI/HI.      BMI 40.0-44.9, adult [facility])    Refer to morbid obesity plan of care with focus on healthy diet and exercise changes.       Other Visit Diagnoses    Physical exam, annual    -  Primary   Prostate cancer screening       Psa on labs today   Relevant Orders   PSA       Discussed aspirin prophylaxis for myocardial infarction prevention and decision was made  to continue ASA  LABORATORY TESTING:  Health maintenance labs ordered today as discussed above.   The natural history of prostate cancer and ongoing controversy regarding screening and potential treatment outcomes of prostate cancer has been discussed with the patient. The meaning of a false positive PSA and a false negative PSA has been discussed. He indicates understanding of the limitations of this screening test and wishes to proceed with screening PSA testing.   IMMUNIZATIONS:   - Tdap: Tetanus vaccination status reviewed: last tetanus booster within 10 years. - Influenza: Up to date - Pneumovax: Up to date - Prevnar: Not applicable - Zostavax vaccine: Up to date  SCREENING: - Colonoscopy: Up to date  Discussed with patient purpose of the colonoscopy is to detect colon cancer at curable precancerous or early stages   - AAA Screening: Not applicable  -Hearing Test: Not applicable  -Spirometry: Not applicable   PATIENT COUNSELING:  Sexuality: Discussed sexually transmitted diseases, partner selection, use of condoms, avoidance of unintended pregnancy  and contraceptive alternatives.   Advised to avoid cigarette smoking.  I discussed with the patient that most people either abstain from alcohol or drink within safe limits (<=14/week and <=4 drinks/occasion for males, <=7/weeks and <= 3 drinks/occasion for females) and that the risk for alcohol disorders and other health effects rises proportionally with the number of drinks per week and how often a drinker exceeds daily limits.  Discussed cessation/primary prevention of drug use and availability of treatment for abuse.   Diet: Encouraged to adjust caloric intake to maintain  or achieve ideal body weight, to reduce intake of dietary saturated fat and total fat, to limit sodium intake by avoiding high sodium foods and not adding table salt, and to maintain adequate dietary potassium and calcium preferably from fresh fruits,  vegetables, and low-fat dairy products.    stressed the importance of regular exercise  Injury prevention: Discussed safety belts, safety helmets, smoke detector, smoking near bedding or upholstery.   Dental health: Discussed importance of regular tooth brushing, flossing, and dental visits.   Follow up plan: NEXT PREVENTATIVE PHYSICAL DUE IN 1 YEAR. Return in about 3 months (around 09/10/2020) for T2DM, HTN/HLD, Mood.

## 2020-06-11 LAB — CBC WITH DIFFERENTIAL/PLATELET
Basophils Absolute: 0.1 10*3/uL (ref 0.0–0.2)
Basos: 1 %
EOS (ABSOLUTE): 0.3 10*3/uL (ref 0.0–0.4)
Eos: 3 %
Hematocrit: 44.6 % (ref 37.5–51.0)
Hemoglobin: 14.5 g/dL (ref 13.0–17.7)
Immature Grans (Abs): 0.1 10*3/uL (ref 0.0–0.1)
Immature Granulocytes: 2 %
Lymphocytes Absolute: 2 10*3/uL (ref 0.7–3.1)
Lymphs: 25 %
MCH: 28.3 pg (ref 26.6–33.0)
MCHC: 32.5 g/dL (ref 31.5–35.7)
MCV: 87 fL (ref 79–97)
Monocytes Absolute: 0.6 10*3/uL (ref 0.1–0.9)
Monocytes: 7 %
Neutrophils Absolute: 5 10*3/uL (ref 1.4–7.0)
Neutrophils: 62 %
Platelets: 169 10*3/uL (ref 150–450)
RBC: 5.12 x10E6/uL (ref 4.14–5.80)
RDW: 14 % (ref 11.6–15.4)
WBC: 8.1 10*3/uL (ref 3.4–10.8)

## 2020-06-11 LAB — COMPREHENSIVE METABOLIC PANEL
ALT: 30 IU/L (ref 0–44)
AST: 26 IU/L (ref 0–40)
Albumin/Globulin Ratio: 2 (ref 1.2–2.2)
Albumin: 4.6 g/dL (ref 3.8–4.8)
Alkaline Phosphatase: 104 IU/L (ref 48–121)
BUN/Creatinine Ratio: 20 (ref 10–24)
BUN: 22 mg/dL (ref 8–27)
Bilirubin Total: 0.3 mg/dL (ref 0.0–1.2)
CO2: 24 mmol/L (ref 20–29)
Calcium: 9.3 mg/dL (ref 8.6–10.2)
Chloride: 102 mmol/L (ref 96–106)
Creatinine, Ser: 1.09 mg/dL (ref 0.76–1.27)
GFR calc Af Amer: 82 mL/min/{1.73_m2} (ref >59)
GFR calc non Af Amer: 71 mL/min/{1.73_m2} (ref >59)
Globulin, Total: 2.3 g/dL (ref 1.5–4.5)
Glucose: 140 mg/dL — ABNORMAL HIGH (ref 65–99)
Potassium: 4.7 mmol/L (ref 3.5–5.2)
Sodium: 140 mmol/L (ref 134–144)
Total Protein: 6.9 g/dL (ref 6.0–8.5)

## 2020-06-11 LAB — TSH: TSH: 2.48 u[IU]/mL (ref 0.450–4.500)

## 2020-06-11 LAB — PSA: Prostate Specific Ag, Serum: 0.2 ng/mL (ref 0.0–4.0)

## 2020-06-11 LAB — LIPID PANEL W/O CHOL/HDL RATIO
Cholesterol, Total: 164 mg/dL (ref 100–199)
HDL: 36 mg/dL — ABNORMAL LOW (ref >39)
LDL Chol Calc (NIH): 93 mg/dL (ref 0–99)
Triglycerides: 201 mg/dL — ABNORMAL HIGH (ref 0–149)
VLDL Cholesterol Cal: 35 mg/dL (ref 5–40)

## 2020-06-12 NOTE — Progress Notes (Signed)
Contacted via [place] morning Brian Frederick, your labs have returned.  Everything looks great, including kidney function.  Cholesterol levels could use some tighter control with your diabetes to help prevent stroke.  Would like to see LDL <70, your level was 93.  I would recommend increasing Atorvastatin to 40 MG daily.  Would you be okay with this?  If so I will send in this script and you can stop the 20 MG dosing.  Then we would recheck levels next visit.  Any questions? Keep being awesome!! Kindest regards, Maheen Cwikla

## 2020-07-18 ENCOUNTER — Other Ambulatory Visit: Payer: Self-pay | Admitting: Nurse Practitioner

## 2020-08-08 ENCOUNTER — Encounter: Payer: Self-pay | Admitting: Dermatology

## 2020-08-08 ENCOUNTER — Ambulatory Visit (INDEPENDENT_AMBULATORY_CARE_PROVIDER_SITE_OTHER): Payer: 59 | Admitting: Dermatology

## 2020-08-08 ENCOUNTER — Other Ambulatory Visit: Payer: Self-pay

## 2020-08-08 DIAGNOSIS — L820 Inflamed seborrheic keratosis: Secondary | ICD-10-CM

## 2020-08-08 DIAGNOSIS — L578 Other skin changes due to chronic exposure to nonionizing radiation: Secondary | ICD-10-CM | POA: Diagnosis not present

## 2020-08-08 DIAGNOSIS — L821 Other seborrheic keratosis: Secondary | ICD-10-CM

## 2020-08-08 DIAGNOSIS — L570 Actinic keratosis: Secondary | ICD-10-CM | POA: Diagnosis not present

## 2020-08-08 NOTE — Progress Notes (Signed)
   Follow-Up Visit   Subjective  Brian Frederick is a 64 y.o. male who presents for the following: Follow-up. Patient presents today for follow up on OV 04/27/20 for AK's x 16 on Face/scalp, ears and ISK's Scalp, forearms, and back. Patient also states that he has a few new areas of ococern on his b/l sideburn area  The following portions of the chart were reviewed this encounter and updated as appropriate:  Tobacco  Allergies  Meds  Problems  Med Hx  Surg Hx  Fam Hx     Review of Systems:  No other skin or systemic complaints except as noted in HPI or Assessment and Plan.  Objective  Well appearing patient in no apparent distress; mood and affect are within normal limits.  A focused examination was performed including Face, ears, back and scalp. Relevant physical exam findings are noted in the Assessment and Plan.  Objective  Face x 8 (8): Erythematous thin papules/macules with gritty scale.   Early AK on Scalp  Objective  Right sideburn: Erythematous keratotic or waxy stuck-on papule or plaque.    Assessment & Plan  AK (actinic keratosis) (8) Face x 8  Discuss PDT for scalp, will schedule in 2 weeks for scalp  Cryotherapy today Prior to procedure, discussed risks of blister formation, small wound, skin dyspigmentation, or rare scar following cryotherapy.    Destruction of lesion - Face x 8 Complexity: simple   Destruction method: cryotherapy   Informed consent: discussed and consent obtained   Timeout:  patient name, date of birth, surgical site, and procedure verified Lesion destroyed using liquid nitrogen: Yes   Region frozen until ice ball extended beyond lesion: Yes   Outcome: patient tolerated procedure well with no complications   Post-procedure details: wound care instructions given    Inflamed seborrheic keratosis Right sideburn  Cryotherapy today Prior to procedure, discussed risks of blister formation, small wound, skin dyspigmentation, or rare  scar following cryotherapy.    Destruction of lesion - Right sideburn Complexity: simple   Destruction method: cryotherapy   Informed consent: discussed and consent obtained   Timeout:  patient name, date of birth, surgical site, and procedure verified Lesion destroyed using liquid nitrogen: Yes   Region frozen until ice ball extended beyond lesion: Yes   Outcome: patient tolerated procedure well with no complications   Post-procedure details: wound care instructions given    Return in about 2 weeks (around 08/22/2020) for schedule PDT for Scalp x 2 weeks, AK Follow up 6 months.  Actinic Damage - diffuse scaly erythematous macules with underlying dyspigmentation - Recommend daily broad spectrum sunscreen SPF 30+ to sun-exposed areas, reapply every 2 hours as needed.  - Call for new or changing lesions.  Seborrheic Keratoses - Stuck-on, waxy, tan-brown papules and plaques  - Discussed benign etiology and prognosis. - Observe - Call for any changes  I, Donzetta Kohut, CMA, am acting as scribe for Sarina Ser, MD . Documentation: I have reviewed the above documentation for accuracy and completeness, and I agree with the above.  Sarina Ser, MD

## 2020-08-08 NOTE — Patient Instructions (Signed)
Recommend daily broad spectrum sunscreen SPF 30+ to sun-exposed areas, reapply every 2 hours as needed. Call for new or changing lesions.  

## 2020-08-16 ENCOUNTER — Encounter: Payer: Self-pay | Admitting: Dermatology

## 2020-08-17 ENCOUNTER — Other Ambulatory Visit: Payer: Self-pay | Admitting: Neurology

## 2020-08-23 ENCOUNTER — Other Ambulatory Visit: Payer: Self-pay

## 2020-08-23 ENCOUNTER — Ambulatory Visit (INDEPENDENT_AMBULATORY_CARE_PROVIDER_SITE_OTHER): Payer: 59

## 2020-08-23 DIAGNOSIS — L570 Actinic keratosis: Secondary | ICD-10-CM

## 2020-08-23 MED ORDER — AMINOLEVULINIC ACID HCL 20 % EX SOLR
1.0000 "application " | Freq: Once | CUTANEOUS | Status: AC
  Administered 2020-08-23: 354 mg via TOPICAL

## 2020-08-23 NOTE — Patient Instructions (Signed)

## 2020-08-23 NOTE — Progress Notes (Signed)
Patient completed PDT therapy today.  1. AK (actinic keratosis) Scalp  Photodynamic therapy - Scalp Procedure discussed: discussed risks, benefits, side effects. and alternatives   Prep: site scrubbed/prepped with acetone   Location:  Scalp Number of lesions:  Multiple Type of treatment:  Blue light Aminolevulinic Acid (see MAR for details): Levulan Number of Levulan sticks used:  1 Incubation time (minutes):  120 Number of minutes under lamp:  16 Number of seconds under lamp:  40 Cooling:  Floor fan Outcome: patient tolerated procedure well with no complications   Post-procedure details: sunscreen applied    Aminolevulinic Acid HCl 20 % SOLR 354 mg - Scalp    

## 2020-09-13 ENCOUNTER — Ambulatory Visit: Payer: 59 | Admitting: Nurse Practitioner

## 2020-09-28 ENCOUNTER — Other Ambulatory Visit: Payer: Self-pay

## 2020-09-28 ENCOUNTER — Encounter: Payer: Self-pay | Admitting: Nurse Practitioner

## 2020-09-28 ENCOUNTER — Telehealth: Payer: Self-pay

## 2020-09-28 ENCOUNTER — Ambulatory Visit (INDEPENDENT_AMBULATORY_CARE_PROVIDER_SITE_OTHER): Payer: 59 | Admitting: Nurse Practitioner

## 2020-09-28 VITALS — BP 132/79 | HR 65 | Temp 99.3°F | Ht 71.0 in | Wt 275.0 lb

## 2020-09-28 DIAGNOSIS — E669 Obesity, unspecified: Secondary | ICD-10-CM

## 2020-09-28 DIAGNOSIS — E559 Vitamin D deficiency, unspecified: Secondary | ICD-10-CM

## 2020-09-28 DIAGNOSIS — E1169 Type 2 diabetes mellitus with other specified complication: Secondary | ICD-10-CM | POA: Diagnosis not present

## 2020-09-28 DIAGNOSIS — E785 Hyperlipidemia, unspecified: Secondary | ICD-10-CM

## 2020-09-28 DIAGNOSIS — E1159 Type 2 diabetes mellitus with other circulatory complications: Secondary | ICD-10-CM

## 2020-09-28 DIAGNOSIS — F419 Anxiety disorder, unspecified: Secondary | ICD-10-CM

## 2020-09-28 DIAGNOSIS — E538 Deficiency of other specified B group vitamins: Secondary | ICD-10-CM | POA: Diagnosis not present

## 2020-09-28 DIAGNOSIS — G20 Parkinson's disease: Secondary | ICD-10-CM

## 2020-09-28 DIAGNOSIS — K219 Gastro-esophageal reflux disease without esophagitis: Secondary | ICD-10-CM | POA: Diagnosis not present

## 2020-09-28 DIAGNOSIS — N521 Erectile dysfunction due to diseases classified elsewhere: Secondary | ICD-10-CM

## 2020-09-28 DIAGNOSIS — Z23 Encounter for immunization: Secondary | ICD-10-CM

## 2020-09-28 DIAGNOSIS — Z6838 Body mass index (BMI) 38.0-38.9, adult: Secondary | ICD-10-CM

## 2020-09-28 LAB — BAYER DCA HB A1C WAIVED: HB A1C (BAYER DCA - WAIVED): 6.2 % (ref <7.0)

## 2020-09-28 MED ORDER — SILDENAFIL CITRATE 100 MG PO TABS: 50.0000 mg | ORAL_TABLET | Freq: Every day | ORAL | 11 refills | Status: AC | PRN

## 2020-09-28 NOTE — Assessment & Plan Note (Signed)
With T2DM and HTN, has lost 13 pounds -- praised for this.  Recommended eating smaller high protein, low fat meals more frequently and exercising 30 mins a day 5 times a week with a goal of 10-15lb weight loss in the next 3 months. Patient voiced their understanding and motivation to adhere to these recommendations.

## 2020-09-28 NOTE — Patient Instructions (Signed)

## 2020-09-28 NOTE — Assessment & Plan Note (Signed)
With HTN and h/o open heart surgery.  Ongoing with BP at goal today in office and on home readings.  Recommend continued evaluation of BP at home three times a week and document for provider + focus heavily on diet changes.  Continue Lisinopril 5 MG daily and Metoprolol 100 MG daily. Check BMP next visit.  Return in 3 months.

## 2020-09-28 NOTE — Assessment & Plan Note (Addendum)
Followed by neurology, continue collaboration and current medication regimen as prescribed by them.  Recent note reviewed.  Is having intermittent diarrhea with Sinemet on board, educated him on this and may use Imodium as needed.  Notify provider if worsening.

## 2020-09-28 NOTE — Assessment & Plan Note (Signed)
Stable on Lexapro.  Continue low dose medication and monitor.  Denies SI/HI.

## 2020-09-28 NOTE — Assessment & Plan Note (Signed)
Ongoing related to underlying chronic disease processes.  Refills on Viagra sent.

## 2020-09-28 NOTE — Telephone Encounter (Signed)
PA or Sildenafil initiated and submitted via Cover My Meds. Key: (KeyLesia Hausen

## 2020-09-28 NOTE — Assessment & Plan Note (Signed)
Chronic, stable. Continue current medication regimen and adjust as needed.  Mag level today. 

## 2020-09-28 NOTE — Assessment & Plan Note (Signed)
Chronic, ongoing with A1C 6.2% today, trend down.  Has lost almost 13 pounds, praised for this.  Continue Trulicity increased to 1.5 MG weekly last visit, and to continue Metformin and Levemir at current doses, adjust doses as needed based on lab findings.  Continue Gabapentin for neuropathy. Will consider increase in Trulicity to 3 MG weekly and continue increase to meet goal and reduce insulin with goal to d/c insulin. Return in 3 months for A1C check.  Highly recommend he focus heavily at home on diet changes as has poor diet compliance.

## 2020-09-28 NOTE — Progress Notes (Addendum)
BP 132/79 (BP Location: Left Arm, Patient Position: Sitting, Cuff Size: Large)   Pulse 65   Temp 99.3 F (37.4 C) (Oral)   Ht 5\' 11"  (1.803 m)   Wt 275 lb (124.7 kg)   SpO2 94%   BMI 38.35 kg/m    Subjective:    Patient ID: Brian Frederick, male    DOB: Sep 15, 1956, 64 y.o.   MRN: [id number]  HPI: Brian Frederick is a 65 y.o. male  Chief Complaint  Patient presents with  . Diabetes  . Hypertension  . Hyperlipidemia   DIABETES Continues onTrulicity 1.5 MG and Metformin XR 1000 MG BID +Levemir 35 units. June 2021 7.5%.  Continues on Gabapentin by podiatry and tolerating well, with exception of some mild edema to BLE.  Has history of B12 and Vit D deficiency, will recheck these today -- taking vitamin daily.  Continues on Prevacid for GERD.  Has lost a total of 13 pounds. Hypoglycemic episodes:no Polydipsia/polyuria: no Visual disturbance: no Chest pain: no Paresthesias: no Glucose Monitoring: yes Accucheck frequency: Daily Fasting glucose: 120-130 range in morning this week, last month 140-150 Post prandial: Evening:  Before meals: Taking Insulin?: yes Long acting insulin: Levemir 35 units Short acting insulin: Blood Pressure Monitoring: weekly Retinal Examination: Up to Date Foot Exam: Up to Date Pneumovax: Up to Date Influenza: Up to Date Aspirin: yes  HYPERTENSION / HYPERLIPIDEMIA Takes Lisinopril 5 MG daily, Metoprolol 100 MG daily, and Lipitor 20 MG. Satisfied with current treatment? yes Duration of hypertension: chronic BP monitoring frequency: weekly BP range: 120-130/80 BP medication side effects: no Duration of hyperlipidemia: chronic Cholesterol medication side effects: no Cholesterol supplements: none Medication compliance: good compliance Aspirin: yes Recent stressors: no Recurrent headaches:no Visual changes: no Palpitations: no Dyspnea: no Chest pain:  no Lower extremity edema: occasional -- is taking Lasix at this time once a day -- started when took Gabapentin -- this has improved Dizzy/lightheaded: no  PARKINSON'S DISEASE: Diagnosed in 2015.  Sees Dr. Carles Collet, saw last on 05/24/20 -- Pramipexole was reduced to 0.5 MG TID due to fatigue (he reports he has now stopped taking -- was stopped due to side effects) and Sinemet 1 tablet TID started, to continue Trihexyphenidyl 2 MG TID.  Denies any recent falls or decline in function.  Saw dermatology on 08/08/20, who he sees yearly due to increased risk melanoma with PD.  Does endorse some issues with ED, had previously been on Viagra and Cialis.  He preferred the Viagra.  Has difficulty achieving and maintaining erection -- would like to restart Viagra.  ANXIETY/STRESS On Lexapro 10 MG daily. Duration:controlled Anxious mood: no  Excessive worrying: no Irritability: no  Sweating: no Nausea: no Palpitations:no Hyperventilation: no Panic attacks: no Agoraphobia: no  Obscessions/compulsions: no Depressed mood: no Anhedonia: no Weight changes: no Insomnia: yes hard to stay asleep  Hypersomnia: no Fatigue/loss of energy: no Feelings of worthlessness: no Feelings of guilt: no Impaired concentration/indecisiveness: no Suicidal ideations: no  Crying spells: no Recent Stressors/Life Changes: no   Relationship problems: no   Family stress: no     Financial stress: no    Job stress: no    Recent death/loss: no Depression screen [facility] 2/9 09/28/2020 05/24/2020 05/17/2020 06/03/2019 05/05/2019  Decreased Interest 0 0 0 0 0  Down, Depressed, Hopeless 0 0 0 0 0  PHQ - 2 Score 0 0 0 0 0  Altered sleeping 0 - 0 2 3  Tired, decreased energy 0 - 0 0 0  Change in appetite 0 - 0 0 0  Feeling bad or failure about yourself  0 - 0 0 0  Trouble concentrating 0 - 0 0 0  Moving slowly or fidgety/restless 0 - 0 0 0  Suicidal thoughts 0 - 0 0 0  PHQ-9 Score 0 - 0 2 3  Difficult doing work/chores - - - - Not  difficult at all   GAD 7 : Generalized Anxiety Score 09/28/2020 05/17/2020 06/03/2019 05/05/2019  Nervous, Anxious, on Edge 0 0 0 0  Control/stop worrying 0 0 0 0  Worry too much - different things 0 0 0 0  Trouble relaxing 0 0 0 0  Restless 0 0 0 0  Easily annoyed or irritable 0 0 0 0  Afraid - awful might happen 0 0 0 0  Total GAD 7 Score 0 0 0 0  Anxiety Difficulty - Not difficult at all Not difficult at all Not difficult at all    Relevant past medical, surgical, family and social history reviewed and updated as indicated. Interim medical history since our last visit reviewed. Allergies and medications reviewed and updated.  Review of Systems  Constitutional: Negative for activity change, diaphoresis, fatigue and fever.  Respiratory: Negative for cough, chest tightness, shortness of breath and wheezing.   Cardiovascular: Negative for chest pain, palpitations and leg swelling.  Gastrointestinal: Negative.   Endocrine: Negative for polydipsia, polyphagia and polyuria.  Neurological: Negative.   Psychiatric/Behavioral: Negative.    Per HPI unless specifically indicated above     Objective:    BP 132/79 (BP Location: Left Arm, Patient Position: Sitting, Cuff Size: Large)   Pulse 65   Temp 99.3 F (37.4 C) (Oral)   Ht 5\' 11"  (1.803 m)   Wt 275 lb (124.7 kg)   SpO2 94%   BMI 38.35 kg/m   Wt Readings from Last 3 Encounters:  09/28/20 275 lb (124.7 kg)  06/10/20 286 lb 6.4 oz (129.9 kg)  05/24/20 288 lb (130.6 kg)    Physical Exam Vitals and nursing note reviewed.  Constitutional:      General: He is awake. He is not in acute distress.    Appearance: He is well-developed and well-groomed. He is morbidly obese. He is not ill-appearing.  HENT:     Head: Normocephalic and atraumatic.     Right Ear: Hearing normal. No drainage.     Left Ear: Hearing normal. No drainage.  Eyes:     General: Lids are normal.        Right eye: No discharge.        Left eye: No discharge.      Conjunctiva/sclera: Conjunctivae normal.     Pupils: Pupils are equal, round, and reactive to light.  Neck:     Thyroid: No thyromegaly.     Vascular: No carotid bruit.  Cardiovascular:     Rate and Rhythm: Normal rate and regular rhythm.     Heart sounds: Normal heart sounds, S1 normal and S2 normal. No murmur heard.  No gallop.   Pulmonary:     Effort: Pulmonary effort is normal. No accessory muscle usage or respiratory distress.     Breath sounds: Normal breath sounds.  Abdominal:     General: Bowel sounds are normal.     Palpations: Abdomen is soft.  Musculoskeletal:        General: Normal range of motion.     Cervical back: Normal range of motion and neck supple.     Right lower  leg: No edema.     Left lower leg: No edema.  Skin:    General: Skin is warm and dry.     Capillary Refill: Capillary refill takes less than 2 seconds.  Neurological:     Mental Status: He is alert and oriented to person, place, and time.  Psychiatric:        Attention and Perception: Attention normal.        Mood and Affect: Mood normal.        Speech: Speech normal.        Behavior: Behavior normal. Behavior is cooperative.        Thought Content: Thought content normal.    Results for orders placed or performed in visit on 06/10/20  CBC with Differential/Platelet  Result Value Ref Range   WBC 8.1 3.4 - 10.8 x10E3/uL   RBC 5.12 4.14 - 5.80 x10E6/uL   Hemoglobin 14.5 13.0 - 17.7 g/dL   Hematocrit 44.6 37.5 - 51.0 %   MCV 87 79 - 97 fL   MCH 28.3 26.6 - 33.0 pg   MCHC 32.5 31 - 35 g/dL   RDW 14.0 11.6 - 15.4 %   Platelets 169 150 - 450 x10E3/uL   Neutrophils 62 Not Estab. %   Lymphs 25 Not Estab. %   Monocytes 7 Not Estab. %   Eos 3 Not Estab. %   Basos 1 Not Estab. %   Neutrophils Absolute 5.0 1 - 7 x10E3/uL   Lymphocytes Absolute 2.0 0 - 3 x10E3/uL   Monocytes Absolute 0.6 0 - 0 x10E3/uL   EOS (ABSOLUTE) 0.3 0.0 - 0.4 x10E3/uL   Basophils Absolute 0.1 0 - 0 x10E3/uL   Immature  Granulocytes 2 Not Estab. %   Immature Grans (Abs) 0.1 0.0 - 0.1 x10E3/uL  Comprehensive metabolic panel  Result Value Ref Range   Glucose 140 (H) 65 - 99 mg/dL   BUN 22 8 - 27 mg/dL   Creatinine, Ser 1.09 0.76 - 1.27 mg/dL   GFR calc non Af Amer 71 >59 mL/min/1.73   GFR calc Af Amer 82 >59 mL/min/1.73   BUN/Creatinine Ratio 20 10 - 24   Sodium 140 134 - 144 mmol/L   Potassium 4.7 3.5 - 5.2 mmol/L   Chloride 102 96 - 106 mmol/L   CO2 24 20 - 29 mmol/L   Calcium 9.3 8.6 - 10.2 mg/dL   Total Protein 6.9 6.0 - 8.5 g/dL   Albumin 4.6 3.8 - 4.8 g/dL   Globulin, Total 2.3 1.5 - 4.5 g/dL   Albumin/Globulin Ratio 2.0 1.2 - 2.2   Bilirubin Total 0.3 0.0 - 1.2 mg/dL   Alkaline Phosphatase 104 48 - 121 IU/L   AST 26 0 - 40 IU/L   ALT 30 0 - 44 IU/L  Lipid Panel w/o Chol/HDL Ratio  Result Value Ref Range   Cholesterol, Total 164 100 - 199 mg/dL   Triglycerides 201 (H) 0 - 149 mg/dL   HDL 36 (L) >39 mg/dL   VLDL Cholesterol Cal 35 5 - 40 mg/dL   LDL Chol Calc (NIH) 93 0 - 99 mg/dL  TSH  Result Value Ref Range   TSH 2.480 0.450 - 4.500 uIU/mL  PSA  Result Value Ref Range   Prostate Specific Ag, Serum 0.2 0.0 - 4.0 ng/mL      Assessment & Plan:   Problem List Items Addressed This Visit      Cardiovascular and Mediastinum   Type 2 diabetes mellitus with cardiac  complication ([place]) - Primary    With HTN and h/o open heart surgery.  Ongoing with BP at goal today in office and on home readings.  Recommend continued evaluation of BP at home three times a week and document for provider + focus heavily on diet changes.  Continue Lisinopril 5 MG daily and Metoprolol 100 MG daily. Check BMP next visit.  Return in 3 months.      Relevant Medications   sildenafil (VIAGRA) 100 MG tablet     Digestive   GERD (gastroesophageal reflux disease)    Chronic, stable.  Continue current medication regimen and adjust as needed.  Mag level today.      Relevant Orders   Magnesium     Endocrine    Hyperlipidemia associated with type 2 diabetes mellitus (HCC)    Chronic, ongoing.  Continue current medication regimen and adjust as needed.  Lipid panel next visit, recent LDL 93.        Type 2 diabetes mellitus with obesity (HCC)    Chronic, ongoing with A1C 6.2% today, trend down.  Has lost almost 13 pounds, praised for this.  Continue Trulicity increased to 1.5 MG weekly last visit, and to continue Metformin and Levemir at current doses, adjust doses as needed based on lab findings.  Continue Gabapentin for neuropathy. Will consider increase in Trulicity to 3 MG weekly and continue increase to meet goal and reduce insulin with goal to d/c insulin. Return in 3 months for A1C check.  Highly recommend he focus heavily at home on diet changes as has poor diet compliance.      Relevant Orders   Bayer DCA Hb A1c Waived     Nervous and Auditory   Parkinson's disease ([place])    Followed by neurology, continue collaboration and current medication regimen as prescribed by them.  Recent note reviewed.  Is having intermittent diarrhea with Sinemet on board, educated him on this and may use Imodium as needed.  Notify provider if worsening.        Other   Morbid obesity ([place])    With T2DM and HTN, has lost 13 pounds -- praised for this.  Recommended eating smaller high protein, low fat meals more frequently and exercising 30 mins a day 5 times a week with a goal of 10-15lb weight loss in the next 3 months. Patient voiced their understanding and motivation to adhere to these recommendations.       ED (erectile dysfunction)    Ongoing related to underlying chronic disease processes.  Refills on Viagra sent.      Chronic anxiety    Stable on Lexapro.  Continue low dose medication and monitor.  Denies SI/HI.      BMI 38.0-38.9,adult    Has lost over 13 pounds, praised for success, continue focus on diet and exercise.       Other Visit Diagnoses    B12 deficiency       History of low levels,  recheck today and initiate supplement as needed.   Relevant Orders   Vitamin B12   Vitamin D deficiency       History of low levels, recheck today and start supplement as needed.   Relevant Orders   VITAMIN D 25 Hydroxy (Vit-D Deficiency, Fractures)   Need for influenza vaccination       Flu vaccine today   Relevant Orders   Flu Vaccine QUAD 36+ mos IM (Completed)       Follow up plan: Return in about  3 months (around 12/29/2020) for T2DM, HTN/HLD, PD, MOOD.

## 2020-09-28 NOTE — Assessment & Plan Note (Signed)
Has lost over 13 pounds, praised for success, continue focus on diet and exercise.

## 2020-09-28 NOTE — Assessment & Plan Note (Signed)
Chronic, ongoing.  Continue current medication regimen and adjust as needed.  Lipid panel next visit, recent LDL 93.

## 2020-09-29 ENCOUNTER — Encounter: Payer: Self-pay | Admitting: Nurse Practitioner

## 2020-09-29 DIAGNOSIS — E538 Deficiency of other specified B group vitamins: Secondary | ICD-10-CM | POA: Insufficient documentation

## 2020-09-29 LAB — VITAMIN B12: Vitamin B-12: 368 pg/mL (ref 232–1245)

## 2020-09-29 LAB — MAGNESIUM: Magnesium: 1.8 mg/dL (ref 1.6–2.3)

## 2020-09-29 LAB — VITAMIN D 25 HYDROXY (VIT D DEFICIENCY, FRACTURES): Vit D, 25-Hydroxy: 35.7 ng/mL (ref 30.0–100.0)

## 2020-09-29 NOTE — Telephone Encounter (Signed)
PA approved.

## 2020-09-29 NOTE — Progress Notes (Signed)
Contacted via MyChart  Good morning Brian Frederick, I am still waiting on your Vitamin D level, but Vitamin B12 and Magnesium levels have returned.  Magnesium is normal.  Your Vitamin B12 is on lower side of normal, we like to see above 300 and your level is 368.  I do recommend you start taking Vitamin B12 1000 MCG daily as Metformin can lower these levels.  B12 is important for nervous system health and can help with neuropathy pain like you have in your feet.  You can get Vitamin B12 in the vitamin section of any store.  Start taking this daily and we will recheck next visit.  Any questions?  I will let you know when Vitamin D returns. Keep being awesome!!  Thank you for allowing me to participate in your care. Kindest regards, Brexlee Heberlein

## 2020-10-16 ENCOUNTER — Other Ambulatory Visit: Payer: Self-pay | Admitting: Nurse Practitioner
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
## 2020-10-25 ENCOUNTER — Ambulatory Visit (INDEPENDENT_AMBULATORY_CARE_PROVIDER_SITE_OTHER): Payer: 59 | Admitting: Neurology

## 2020-10-25 ENCOUNTER — Other Ambulatory Visit: Payer: Self-pay

## 2020-10-25 ENCOUNTER — Encounter: Payer: Self-pay | Admitting: Neurology

## 2020-10-25 VITALS — BP 147/92 | HR 82 | Ht 71.0 in | Wt 269.0 lb

## 2020-10-25 DIAGNOSIS — G20 Parkinson's disease: Secondary | ICD-10-CM

## 2020-10-25 NOTE — Patient Instructions (Signed)
Let me know if you decide to trial without tridexyphenidyl.  Continue carbidopa/levodopa 25/100 at 7am/11am/4pm.  Increase exercise!  The physicians and staff at [facility] are committed to providing excellent care. You may receive a survey requesting feedback about your experience at our office. We strive to receive "very good" responses to the survey questions. If you feel that your experience would prevent you from giving the office a "very good " response, please contact our office to try to remedy the situation. We may be reached at [phone]. Thank you for taking the time out of your busy day to complete the survey.

## 2020-11-02 ENCOUNTER — Other Ambulatory Visit: Payer: Self-pay | Admitting: Neurology

## 2020-11-03 NOTE — Telephone Encounter (Signed)
Rx(s) sent to pharmacy electronically.  

## 2020-11-15 ENCOUNTER — Other Ambulatory Visit: Payer: Self-pay | Admitting: Nurse Practitioner

## 2020-11-15 ENCOUNTER — Other Ambulatory Visit: Payer: Self-pay | Admitting: Neurology

## 2020-12-02 ENCOUNTER — Other Ambulatory Visit: Payer: Self-pay | Admitting: Nurse Practitioner

## 2020-12-15 ENCOUNTER — Other Ambulatory Visit: Payer: Self-pay | Admitting: Nurse Practitioner

## 2020-12-21 ENCOUNTER — Other Ambulatory Visit: Payer: Self-pay | Admitting: Nurse Practitioner

## 2021-01-11 ENCOUNTER — Other Ambulatory Visit: Payer: Self-pay

## 2021-01-11 ENCOUNTER — Ambulatory Visit (INDEPENDENT_AMBULATORY_CARE_PROVIDER_SITE_OTHER): Payer: Medicare Other | Admitting: Nurse Practitioner

## 2021-01-11 ENCOUNTER — Encounter: Payer: Self-pay | Admitting: Nurse Practitioner

## 2021-01-11 VITALS — BP 117/78 | HR 74 | Temp 98.4°F | Ht 70.79 in | Wt 260.6 lb

## 2021-01-11 DIAGNOSIS — E538 Deficiency of other specified B group vitamins: Secondary | ICD-10-CM | POA: Diagnosis not present

## 2021-01-11 DIAGNOSIS — Z23 Encounter for immunization: Secondary | ICD-10-CM

## 2021-01-11 DIAGNOSIS — G20 Parkinson's disease: Secondary | ICD-10-CM | POA: Diagnosis not present

## 2021-01-11 DIAGNOSIS — F419 Anxiety disorder, unspecified: Secondary | ICD-10-CM

## 2021-01-11 DIAGNOSIS — E785 Hyperlipidemia, unspecified: Secondary | ICD-10-CM | POA: Diagnosis not present

## 2021-01-11 DIAGNOSIS — E1169 Type 2 diabetes mellitus with other specified complication: Secondary | ICD-10-CM

## 2021-01-11 DIAGNOSIS — E669 Obesity, unspecified: Secondary | ICD-10-CM

## 2021-01-11 DIAGNOSIS — D519 Vitamin B12 deficiency anemia, unspecified: Secondary | ICD-10-CM | POA: Diagnosis not present

## 2021-01-11 DIAGNOSIS — Z6838 Body mass index (BMI) 38.0-38.9, adult: Secondary | ICD-10-CM

## 2021-01-11 DIAGNOSIS — E1159 Type 2 diabetes mellitus with other circulatory complications: Secondary | ICD-10-CM

## 2021-01-11 DIAGNOSIS — G20A1 Parkinson's disease without dyskinesia, without mention of fluctuations: Secondary | ICD-10-CM

## 2021-01-11 LAB — MICROALBUMIN, URINE WAIVED
Creatinine, Urine Waived: 100 mg/dL (ref 10–300)
Microalb, Ur Waived: 30 mg/L — ABNORMAL HIGH (ref 0–19)
Microalb/Creat Ratio: <30 mg/g (ref <30)

## 2021-01-11 LAB — BAYER DCA HB A1C WAIVED: HB A1C (BAYER DCA - WAIVED): 5.6 % (ref <7.0)

## 2021-01-11 MED ORDER — LISINOPRIL 5 MG PO TABS: ORAL_TABLET | ORAL | 4 refills | Status: DC

## 2021-01-11 MED ORDER — METOPROLOL SUCCINATE ER 100 MG PO TB24: ORAL_TABLET | ORAL | 4 refills | Status: DC

## 2021-01-11 MED ORDER — ESCITALOPRAM OXALATE 10 MG PO TABS: 10.0000 mg | ORAL_TABLET | Freq: Every day | ORAL | 4 refills | Status: DC

## 2021-01-11 MED ORDER — ATORVASTATIN CALCIUM 20 MG PO TABS: 20.0000 mg | ORAL_TABLET | Freq: Every day | ORAL | 4 refills | Status: DC

## 2021-01-11 MED ORDER — METFORMIN HCL ER 500 MG PO TB24: ORAL_TABLET | ORAL | 4 refills | Status: DC

## 2021-01-11 MED ORDER — TRULICITY 1.5 MG/0.5ML ~~LOC~~ SOAJ: SUBCUTANEOUS | 4 refills | Status: DC

## 2021-01-11 NOTE — Assessment & Plan Note (Signed)
With HTN and h/o open heart surgery.  A1c 5.6% today -- praised for this.  Ongoing with BP at goal today in office and on home readings.  Recommend continued evaluation of BP at home three times a week and document for provider + focus heavily on diet changes.  Continue Lisinopril 5 MG daily and Metoprolol 100 MG daily. Check BMP today.  Return in 3 months.

## 2021-01-11 NOTE — Assessment & Plan Note (Addendum)
Chronic, ongoing with A1C 5.6% today, trend down. Urine ALB 30, continue Lisinopril. Has lost almost 15 pounds, praised for this.  Continue Trulicity 1.5 MG weekly and to continue Metformin and Levemir at current doses, but have advised him to continue to reduce Levemir by 2 units every 3 days if BS fasting is <130.  Continue Gabapentin for neuropathy. Will consider increase in Trulicity to 3 MG weekly and continue increase to maintain A1c goal and reduce insulin with goal to d/c insulin. Return in 3 months for A1C check.  Highly recommend he focus heavily at home on diet changes as has poor diet compliance.

## 2021-01-11 NOTE — Patient Instructions (Signed)
Recommend cutting back on Levemir by 2 units every 3 days dependent on morning fasting sugars.  If morning fasting is <130 then cut back by 2 units.  If morning fasting is 130 or greater stay at the current dose you are taking.     Diabetes Mellitus and Nutrition, Adult When you have diabetes, or diabetes mellitus, it is very important to have healthy eating habits because your blood sugar (glucose) levels are greatly affected by what you eat and drink. Eating healthy foods in the right amounts, at about the same times every day, can help you:  Control your blood glucose.  Lower your risk of heart disease.  Improve your blood pressure.  Reach or maintain a healthy weight. What can affect my meal plan? Every person with diabetes is different, and each person has different needs for a meal plan. Your health care provider may recommend that you work with a dietitian to make a meal plan that is best for you. Your meal plan may vary depending on factors such as:  The calories you need.  The medicines you take.  Your weight.  Your blood glucose, blood pressure, and cholesterol levels.  Your activity level.  Other health conditions you have, such as heart or kidney disease. How do carbohydrates affect me? Carbohydrates, also called carbs, affect your blood glucose level more than any other type of food. Eating carbs naturally raises the amount of glucose in your blood. Carb counting is a method for keeping track of how many carbs you eat. Counting carbs is important to keep your blood glucose at a healthy level, especially if you use insulin or take certain oral diabetes medicines. It is important to know how many carbs you can safely have in each meal. This is different for every person. Your dietitian can help you calculate how many carbs you should have at each meal and for each snack. How does alcohol affect me? Alcohol can cause a sudden decrease in blood glucose (hypoglycemia),  especially if you use insulin or take certain oral diabetes medicines. Hypoglycemia can be a life-threatening condition. Symptoms of hypoglycemia, such as sleepiness, dizziness, and confusion, are similar to symptoms of having too much alcohol.  Do not drink alcohol if: ? Your health care provider tells you not to drink. ? You are pregnant, may be pregnant, or are planning to become pregnant.  If you drink alcohol: ? Do not drink on an empty stomach. ? Limit how much you use to:  0-1 drink a day for women.  0-2 drinks a day for men. ? Be aware of how much alcohol is in your drink. In the [place] one drink equals one 12 oz bottle of beer (355 mL), one 5 oz glass of wine (148 mL), or one 1 oz glass of hard liquor (44 mL). ? Keep yourself hydrated with water, diet soda, or unsweetened iced tea.  Keep in mind that regular soda, juice, and other mixers may contain a lot of sugar and must be counted as carbs. What are tips for following this plan? Reading food labels  Start by checking the serving size on the "Nutrition Facts" label of packaged foods and drinks. The amount of calories, carbs, fats, and other nutrients listed on the label is based on one serving of the item. Many items contain more than one serving per package.  Check the total grams (g) of carbs in one serving. You can calculate the number of servings of carbs in one serving by  dividing the total carbs by 15. For example, if a food has 30 g of total carbs per serving, it would be equal to 2 servings of carbs.  Check the number of grams (g) of saturated fats and trans fats in one serving. Choose foods that have a low amount or none of these fats.  Check the number of milligrams (mg) of salt (sodium) in one serving. Most people should limit total sodium intake to less than 2,300 mg per day.  Always check the nutrition information of foods labeled as "low-fat" or "nonfat." These foods may be higher in added sugar or refined carbs  and should be avoided.  Talk to your dietitian to identify your daily goals for nutrients listed on the label. Shopping  Avoid buying canned, pre-made, or processed foods. These foods tend to be high in fat, sodium, and added sugar.  Shop around the outside edge of the grocery store. This is where you will most often find fresh fruits and vegetables, bulk grains, fresh meats, and fresh dairy. Cooking  Use low-heat cooking methods, such as baking, instead of high-heat cooking methods like deep frying.  Cook using healthy oils, such as olive, canola, or sunflower oil.  Avoid cooking with butter, cream, or high-fat meats. Meal planning  Eat meals and snacks regularly, preferably at the same times every day. Avoid going long periods of time without eating.  Eat foods that are high in fiber, such as fresh fruits, vegetables, beans, and whole grains. Talk with your dietitian about how many servings of carbs you can eat at each meal.  Eat 4-6 oz (112-168 g) of lean protein each day, such as lean meat, chicken, fish, eggs, or tofu. One ounce (oz) of lean protein is equal to: ? 1 oz (28 g) of meat, chicken, or fish. ? 1 egg. ?  cup (62 g) of tofu.  Eat some foods each day that contain healthy fats, such as avocado, nuts, seeds, and fish.   What foods should I eat? Fruits Berries. Apples. Oranges. Peaches. Apricots. Plums. Grapes. Mango. Papaya. Pomegranate. Kiwi. Cherries. Vegetables Lettuce. Spinach. Leafy greens, including kale, chard, collard greens, and mustard greens. Beets. Cauliflower. Cabbage. Broccoli. Carrots. Green beans. Tomatoes. Peppers. Onions. Cucumbers. Brussels sprouts. Grains Whole grains, such as whole-wheat or whole-grain bread, crackers, tortillas, cereal, and pasta. Unsweetened oatmeal. Quinoa. Brown or wild rice. Meats and other proteins Seafood. Poultry without skin. Lean cuts of poultry and beef. Tofu. Nuts. Seeds. Dairy Low-fat or fat-free dairy products such  as milk, yogurt, and cheese. The items listed above may not be a complete list of foods and beverages you can eat. Contact a dietitian for more information. What foods should I avoid? Fruits Fruits canned with syrup. Vegetables Canned vegetables. Frozen vegetables with butter or cream sauce. Grains Refined white flour and flour products such as bread, pasta, snack foods, and cereals. Avoid all processed foods. Meats and other proteins Fatty cuts of meat. Poultry with skin. Breaded or fried meats. Processed meat. Avoid saturated fats. Dairy Full-fat yogurt, cheese, or milk. Beverages Sweetened drinks, such as soda or iced tea. The items listed above may not be a complete list of foods and beverages you should avoid. Contact a dietitian for more information. Questions to ask a health care provider  Do I need to meet with a diabetes educator?  Do I need to meet with a dietitian?  What number can I call if I have questions?  When are the best times to check my  blood glucose? Where to find more information:  American Diabetes Association: diabetes.org  Academy of Nutrition and Dietetics: www.eatright.[employer] of Diabetes and Digestive and Kidney Diseases: CarFlippers.tn  Association of Diabetes Care and Education Specialists: www.diabeteseducator.org Summary  It is important to have healthy eating habits because your blood sugar (glucose) levels are greatly affected by what you eat and drink.  A healthy meal plan will help you control your blood glucose and maintain a healthy lifestyle.  Your health care provider may recommend that you work with a dietitian to make a meal plan that is best for you.  Keep in mind that carbohydrates (carbs) and alcohol have immediate effects on your blood glucose levels. It is important to count carbs and to use alcohol carefully. This information is not intended to replace advice given to you by your health care provider. Make  sure you discuss any questions you have with your health care provider. Document Revised: 11/10/2019 Document Reviewed: 11/10/2019 Elsevier Patient Education  2021 [employer].

## 2021-01-11 NOTE — Assessment & Plan Note (Signed)
Has lost over 15 pounds, praised for success, continue focus on diet and exercise.

## 2021-01-11 NOTE — Assessment & Plan Note (Signed)
Noted low normal on October 2021 labs -- continue supplement and recheck today as is on chronic Metformin and concern for reduction in this level with chronic use. 

## 2021-01-11 NOTE — Progress Notes (Addendum)
BP 117/78   Pulse 74   Temp 98.4 F (36.9 C) (Oral)   Ht 5' 10.79" (1.798 m)   Wt 260 lb 9.6 oz (118.2 kg)   SpO2 97%   BMI 36.57 kg/m    Subjective:    Patient ID: Brian Frederick, male    DOB: June 01, 1956, 65 y.o.   MRN: [id number]  HPI: Brian Frederick is a 65 y.o. male  Chief Complaint  Patient presents with  . Diabetes  . Hypertension  . Mood   DIABETES Continues onTrulicity 1.5 MG and Metformin XR 1000 MG BID +Levemir 30 units. October 2021 6.2%.  Continues on Gabapentin by podiatry and tolerating well, with exception of some mild edema to BLE.  Has history of B12 and Vit D deficiency -- taking vitamins daily.  Continues on Prevacid for GERD.  Has lost a total of 15 pounds since October. Hypoglycemic episodes:no Polydipsia/polyuria: no Visual disturbance: no Chest pain: no Paresthesias: no Glucose Monitoring: yes Accucheck frequency: Daily Fasting glucose: 96-98 x twice, highest he has had is 140 Post prandial: Evening:  Before meals: Taking Insulin?: yes Long acting insulin: Levemir 30 units -- he decreased to this Short acting insulin: Blood Pressure Monitoring: weekly Retinal Examination: Up to Date Foot Exam: Up to Date Pneumovax: Up to Date Influenza: Up to Date Aspirin: yes  HYPERTENSION / HYPERLIPIDEMIA Takes Lisinopril 5 MG daily, Metoprolol 100 MG daily, and Lipitor 20 MG. Satisfied with current treatment? yes Duration of hypertension: chronic BP monitoring frequency: weekly BP range: 120-130/80 BP medication side effects: no Duration of hyperlipidemia: chronic Cholesterol medication side effects: no Cholesterol supplements: none Medication compliance: good compliance Aspirin: yes Recent stressors: no Recurrent headaches:no Visual changes: no Palpitations: no Dyspnea: no Chest pain: no Lower extremity edema: none Dizzy/lightheaded:  no  PARKINSON'S DISEASE: Diagnosed in 2015.  Sees Dr. Carles Collet, saw last on 10/25/20 -- discontinued Pramipexole due to fatigue and continues Sinemet 1 tablet TID started, to continue Trihexyphenidyl 2 MG TID.  Denies any recent falls or decline in function.  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  Does endorse some issues with ED, had previously been on Viagra and Cialis.  He preferred the Viagra.  Has difficulty achieving and maintaining erection -- would like to restart Viagra.  ANXIETY/STRESS On Lexapro 10 MG daily. Duration:controlled Anxious mood: no  Excessive worrying: no Irritability: no  Sweating: no Nausea: no Palpitations:no Hyperventilation: no Panic attacks: no Agoraphobia: no  Obscessions/compulsions: no Depressed mood: no Anhedonia: no Weight changes: no Insomnia: yes hard to stay asleep  Hypersomnia: no Fatigue/loss of energy: no Feelings of worthlessness: no Feelings of guilt: no Impaired concentration/indecisiveness: no Suicidal ideations: no  Crying spells: no Recent Stressors/Life Changes: no   Relationship problems: no   Family stress: no     Financial stress: no    Job stress: no    Recent death/loss: no Depression screen [facility] 2/9 01/11/2021 09/28/2020 05/24/2020 05/17/2020 06/03/2019  Decreased Interest 0 0 0 0 0  Down, Depressed, Hopeless 0 0 0 0 0  PHQ - 2 Score 0 0 0 0 0  Altered sleeping 0 0 - 0 2  Tired, decreased energy 0 0 - 0 0  Change in appetite 0 0 - 0 0  Feeling bad or failure about yourself  0 0 - 0 0  Trouble concentrating 0 0 - 0 0  Moving slowly or fidgety/restless 0 0 - 0 0  Suicidal thoughts  0 0 - 0 0  PHQ-9 Score 0 0 - 0 2  Difficult doing work/chores - - - - -  Some recent data might be hidden   GAD 7 : Generalized Anxiety Score 09/28/2020 05/17/2020 06/03/2019 05/05/2019  Nervous, Anxious, on Edge 0 0 0 0  Control/stop worrying 0 0 0 0  Worry too much - different things 0 0 0 0  Trouble  relaxing 0 0 0 0  Restless 0 0 0 0  Easily annoyed or irritable 0 0 0 0  Afraid - awful might happen 0 0 0 0  Total GAD 7 Score 0 0 0 0  Anxiety Difficulty - Not difficult at all Not difficult at all Not difficult at all    Relevant past medical, surgical, family and social history reviewed and updated as indicated. Interim medical history since our last visit reviewed. Allergies and medications reviewed and updated.  Review of Systems  Constitutional: Negative for activity change, diaphoresis, fatigue and fever.  Respiratory: Negative for cough, chest tightness, shortness of breath and wheezing.   Cardiovascular: Negative for chest pain, palpitations and leg swelling.  Gastrointestinal: Negative.   Endocrine: Negative for polydipsia, polyphagia and polyuria.  Neurological: Negative.   Psychiatric/Behavioral: Negative.    Per HPI unless specifically indicated above     Objective:    BP 117/78   Pulse 74   Temp 98.4 F (36.9 C) (Oral)   Ht 5' 10.79" (1.798 m)   Wt 260 lb 9.6 oz (118.2 kg)   SpO2 97%   BMI 36.57 kg/m   Wt Readings from Last 3 Encounters:  01/11/21 260 lb 9.6 oz (118.2 kg)  10/25/20 269 lb (122 kg)  09/28/20 275 lb (124.7 kg)    Physical Exam Vitals and nursing note reviewed.  Constitutional:      General: He is awake. He is not in acute distress.    Appearance: He is well-developed and well-groomed. He is morbidly obese. He is not ill-appearing.  HENT:     Head: Normocephalic and atraumatic.     Right Ear: Hearing normal. No drainage.     Left Ear: Hearing normal. No drainage.  Eyes:     General: Lids are normal.        Right eye: No discharge.        Left eye: No discharge.     Conjunctiva/sclera: Conjunctivae normal.     Pupils: Pupils are equal, round, and reactive to light.  Neck:     Thyroid: No thyromegaly.     Vascular: No carotid bruit.  Cardiovascular:     Rate and Rhythm: Normal rate and regular rhythm.     Heart sounds: Normal heart  sounds, S1 normal and S2 normal. No murmur heard. No gallop.   Pulmonary:     Effort: Pulmonary effort is normal. No accessory muscle usage or respiratory distress.     Breath sounds: Normal breath sounds.  Abdominal:     General: Bowel sounds are normal.     Palpations: Abdomen is soft.  Musculoskeletal:        General: Normal range of motion.     Cervical back: Normal range of motion and neck supple.     Right lower leg: No edema.     Left lower leg: No edema.  Skin:    General: Skin is warm and dry.     Capillary Refill: Capillary refill takes less than 2 seconds.  Neurological:     Mental Status: He is alert  and oriented to person, place, and time.     Motor: Tremor present.  Psychiatric:        Attention and Perception: Attention normal.        Mood and Affect: Mood normal.        Speech: Speech normal.        Behavior: Behavior normal. Behavior is cooperative.        Thought Content: Thought content normal.    Results for orders placed or performed in visit on 09/28/20  Bayer DCA Hb A1c Waived  Result Value Ref Range   HB A1C (BAYER DCA - WAIVED) 6.2 <7.0 %  Vitamin B12  Result Value Ref Range   Vitamin B-12 368 232 - 1,245 pg/mL  VITAMIN D 25 Hydroxy (Vit-D Deficiency, Fractures)  Result Value Ref Range   Vit D, 25-Hydroxy 35.7 30.0 - 100.0 ng/mL  Magnesium  Result Value Ref Range   Magnesium 1.8 1.6 - 2.3 mg/dL      Assessment & Plan:   Problem List Items Addressed This Visit      Cardiovascular and Mediastinum   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HCC)    With HTN and h/o open heart surgery.  A1c 5.6% today -- praised for this.  Ongoing with BP at goal today in office and on home readings.  Recommend continued evaluation of BP at home three times a week and document for provider + focus heavily on diet changes.  Continue Lisinopril 5 MG daily and Metoprolol 100 MG daily. Check BMP today.  Return in 3 months.      Relevant Medications    atorvastatin (LIPITOR) 20 MG tablet   lisinopril (ZESTRIL) 5 MG tablet   metoprolol succinate (TOPROL-XL) 100 MG 24 hr tablet   metFORMIN (GLUCOPHAGE-XR) 500 MG 24 hr tablet   Dulaglutide (TRULICITY) 1.5 QQ/5.9DG SOPN   Other Relevant Orders   Bayer DCA Hb A1c Waived   Basic metabolic panel   Microalbumin, Urine Waived     Endocrine   Hyperlipidemia associated with type 2 diabetes mellitus (HCC)    Chronic, ongoing.  Continue current medication regimen and adjust as needed.  Lipid panel next visit, recent LDL 93.        Relevant Medications   atorvastatin (LIPITOR) 20 MG tablet   lisinopril (ZESTRIL) 5 MG tablet   metFORMIN (GLUCOPHAGE-XR) 500 MG 24 hr tablet   Dulaglutide (TRULICITY) 1.5 LO/7.5IE SOPN   Other Relevant Orders   Bayer DCA Hb A1c Waived   Lipid Panel w/o Chol/HDL Ratio   Type 2 diabetes mellitus with obesity (HCC) - Primary    Chronic, ongoing with A1C 5.6% today, trend down. Urine ALB 30, continue Lisinopril. Has lost almost 15 pounds, praised for this.  Continue Trulicity 1.5 MG weekly and to continue Metformin and Levemir at current doses, but have advised him to continue to reduce Levemir by 2 units every 3 days if BS fasting is <130.  Continue Gabapentin for neuropathy. Will consider increase in Trulicity to 3 MG weekly and continue increase to maintain A1c goal and reduce insulin with goal to d/c insulin. Return in 3 months for A1C check.  Highly recommend he focus heavily at home on diet changes as has poor diet compliance.      Relevant Medications   atorvastatin (LIPITOR) 20 MG tablet   lisinopril (ZESTRIL) 5 MG tablet   metFORMIN (GLUCOPHAGE-XR) 500 MG 24 hr tablet   Dulaglutide (TRULICITY) 1.5 PP/2.9JJ SOPN   Other Relevant Orders  Bayer DCA Hb A1c Waived   Basic metabolic panel   Microalbumin, Urine Waived     Nervous and Auditory   Parkinson's disease ([place])    Followed by neurology, continue collaboration and current medication regimen as prescribed  by them.  Recent note reviewed.  He reports feeling better after recent medication changes by neurology.        Other   Morbid obesity ([place])    With T2DM and HTN, has lost 15 pounds -- praised for this.  Recommended eating smaller high protein, low fat meals more frequently and exercising 30 mins a day 5 times a week with a goal of 10-15lb weight loss in the next 3 months. Patient voiced their understanding and motivation to adhere to these recommendations.       Relevant Medications   metFORMIN (GLUCOPHAGE-XR) 500 MG 24 hr tablet   Dulaglutide (TRULICITY) 1.5 0000000 SOPN   Chronic anxiety    Stable on Lexapro.  Continue low dose medication and monitor.  Denies SI/HI.      Relevant Medications   escitalopram (LEXAPRO) 10 MG tablet   BMI 38.0-38.9,adult    Has lost over 15 pounds, praised for success, continue focus on diet and exercise.      Vitamin B12 deficiency    Noted low normal on October 2021 labs -- continue supplement and recheck today as is on chronic Metformin and concern for reduction in this level with chronic use.      Relevant Orders   Vitamin B12    Other Visit Diagnoses    Need for vaccination       Pneumonia vaccine today   Relevant Orders   Pneumococcal conjugate vaccine 13-valent IM (Completed)       Follow up plan: Return in about 3 months (around 04/11/2021) for T2DM, HTN/HLD, MOOD, PD.

## 2021-01-11 NOTE — Assessment & Plan Note (Signed)
Stable on Lexapro.  Continue low dose medication and monitor.  Denies SI/HI. 

## 2021-01-11 NOTE — Assessment & Plan Note (Signed)
With T2DM and HTN, has lost 15 pounds -- praised for this.  Recommended eating smaller high protein, low fat meals more frequently and exercising 30 mins a day 5 times a week with a goal of 10-15lb weight loss in the next 3 months. Patient voiced their understanding and motivation to adhere to these recommendations.

## 2021-01-11 NOTE — Assessment & Plan Note (Signed)
Followed by neurology, continue collaboration and current medication regimen as prescribed by them.  Recent note reviewed.  He reports feeling better after recent medication changes by neurology.

## 2021-01-11 NOTE — Assessment & Plan Note (Signed)
Chronic, ongoing.  Continue current medication regimen and adjust as needed.  Lipid panel next visit, recent LDL 93.   

## 2021-01-12 LAB — BASIC METABOLIC PANEL
BUN/Creatinine Ratio: 22 (ref 10–24)
BUN: 27 mg/dL (ref 8–27)
CO2: 27 mmol/L (ref 20–29)
Calcium: 8.2 mg/dL — ABNORMAL LOW (ref 8.6–10.2)
Chloride: 101 mmol/L (ref 96–106)
Creatinine, Ser: 1.21 mg/dL (ref 0.76–1.27)
GFR calc Af Amer: 72 mL/min/{1.73_m2} (ref >59)
GFR calc non Af Amer: 62 mL/min/{1.73_m2} (ref >59)
Glucose: 108 mg/dL — ABNORMAL HIGH (ref 65–99)
Potassium: 4.1 mmol/L (ref 3.5–5.2)
Sodium: 142 mmol/L (ref 134–144)

## 2021-01-12 LAB — LIPID PANEL W/O CHOL/HDL RATIO
Cholesterol, Total: 110 mg/dL (ref 100–199)
HDL: 30 mg/dL — ABNORMAL LOW (ref >39)
LDL Chol Calc (NIH): 53 mg/dL (ref 0–99)
Triglycerides: 158 mg/dL — ABNORMAL HIGH (ref 0–149)
VLDL Cholesterol Cal: 27 mg/dL (ref 5–40)

## 2021-01-12 LAB — VITAMIN B12: Vitamin B-12: 609 pg/mL (ref 232–1245)

## 2021-01-13 ENCOUNTER — Telehealth: Payer: Self-pay

## 2021-01-13 NOTE — Telephone Encounter (Signed)
Copied from [place] [id number]. Topic: General - Other >> Jan 13, 2021  4:21 PM Yvette Rack wrote: Reason for CRM: Raquel Sarna with [facility] Dept stated they received paperwork on this patient but he is not a patient there. Cb# [phone]

## 2021-01-14 ENCOUNTER — Other Ambulatory Visit: Payer: Self-pay | Admitting: Nurse Practitioner

## 2021-01-16 ENCOUNTER — Telehealth: Payer: Self-pay

## 2021-01-16 NOTE — Telephone Encounter (Signed)
Copied from [place] [phone]. Topic: General - Other >> Jan 13, 2021  4:21 PM Yvette Rack wrote: Reason for CRM: Raquel Sarna with [facility] Dept stated they received paperwork on this patient but he is not a patient there. Cb# [phone]  FYI

## 2021-01-16 NOTE — Telephone Encounter (Signed)
Called and spoke with the health department, informed them that I do not see where or who faxed over the labs, told them to shed the fax, that it must have been a mistake.

## 2021-02-02 ENCOUNTER — Other Ambulatory Visit: Payer: Self-pay

## 2021-02-02 ENCOUNTER — Ambulatory Visit: Payer: Medicare Other | Admitting: Dermatology

## 2021-02-02 DIAGNOSIS — L821 Other seborrheic keratosis: Secondary | ICD-10-CM | POA: Diagnosis not present

## 2021-02-02 DIAGNOSIS — L578 Other skin changes due to chronic exposure to nonionizing radiation: Secondary | ICD-10-CM

## 2021-02-02 DIAGNOSIS — L570 Actinic keratosis: Secondary | ICD-10-CM

## 2021-02-02 DIAGNOSIS — L820 Inflamed seborrheic keratosis: Secondary | ICD-10-CM

## 2021-02-02 NOTE — Progress Notes (Signed)
   Follow-Up Visit   Subjective  Brian Frederick is a 65 y.o. male who presents for the following: Actinic Keratosis (Face/scalp, 6m f/u LN2, PDT with ALA  08/23/2020).  The following portions of the chart were reviewed this encounter and updated as appropriate:   Tobacco  Allergies  Meds  Problems  Med Hx  Surg Hx  Fam Hx     Review of Systems:  No other skin or systemic complaints except as noted in HPI or Assessment and Plan.  Objective  Well appearing patient in no apparent distress; mood and affect are within normal limits.  A focused examination was performed including face, scalp, ears, hands. Relevant physical exam findings are noted in the Assessment and Plan.  Objective  L sideburn x 2 (2): Erythematous keratotic or waxy stuck-on papule or plaque.   Objective  Scalp x 5 (5): Pink scaly macules    Assessment & Plan    Actinic Damage - chronic, secondary to cumulative UV radiation exposure/sun exposure over time - diffuse scaly erythematous macules with underlying dyspigmentation - Recommend daily broad spectrum sunscreen SPF 30+ to sun-exposed areas, reapply every 2 hours as needed.  - Call for new or changing lesions.  Seborrheic Keratoses - Stuck-on, waxy, tan-brown papules and plaques  - Discussed benign etiology and prognosis. - Observe - Call for any changes  Inflamed seborrheic keratosis (2) L sideburn x 2  Destruction of lesion - L sideburn x 2 Complexity: simple   Destruction method: cryotherapy   Informed consent: discussed and consent obtained   Timeout:  patient name, date of birth, surgical site, and procedure verified Lesion destroyed using liquid nitrogen: Yes   Region frozen until ice ball extended beyond lesion: Yes   Outcome: patient tolerated procedure well with no complications   Post-procedure details: wound care instructions given    AK (actinic keratosis) (5) Scalp x 5  Destruction of lesion - Scalp x 5 Complexity: simple    Destruction method: cryotherapy   Informed consent: discussed and consent obtained   Timeout:  patient name, date of birth, surgical site, and procedure verified Lesion destroyed using liquid nitrogen: Yes   Region frozen until ice ball extended beyond lesion: Yes   Outcome: patient tolerated procedure well with no complications   Post-procedure details: wound care instructions given    Return in about 6 months (around 08/02/2021) for TBSE, Hx of BCC, Hx of SCC, Hx of AKs.  I, Othelia Pulling, RMA, am acting as scribe for Sarina Ser, MD .  Documentation: I have reviewed the above documentation for accuracy and completeness, and I agree with the above.  Sarina Ser, MD

## 2021-02-06 ENCOUNTER — Encounter: Payer: Self-pay | Admitting: Dermatology

## 2021-02-13 ENCOUNTER — Other Ambulatory Visit: Payer: Self-pay | Admitting: Neurology

## 2021-02-13 ENCOUNTER — Other Ambulatory Visit: Payer: Self-pay | Admitting: Nurse Practitioner

## 2021-02-27 ENCOUNTER — Other Ambulatory Visit: Payer: Self-pay | Admitting: Nurse Practitioner

## 2021-02-27 NOTE — Telephone Encounter (Signed)
Requested Prescriptions  Pending Prescriptions Disp Refills  . metoprolol succinate (TOPROL-XL) 100 MG 24 hr tablet [Pharmacy Med Name: METOPROLOL ER SUCCINATE 100MG  TABS] 90 tablet 4    Sig: TAKE 1 TABLET BY MOUTH EVERY DAY WITH FOOD OR IMMEDIATELY AFTER A MEAL     Cardiovascular:  Beta Blockers Passed - 02/27/2021 10:32 AM      Passed - Last BP in normal range    BP Readings from Last 1 Encounters:  01/11/21 117/78         Passed - Last Heart Rate in normal range    Pulse Readings from Last 1 Encounters:  01/11/21 74         Passed - Valid encounter within last 6 months    Recent Outpatient Visits          1 month ago Type 2 diabetes mellitus with obesity ([place])   [place], Jolene T, NP   5 months ago 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

## 2021-03-15 ENCOUNTER — Other Ambulatory Visit: Payer: Self-pay | Admitting: Neurology

## 2021-03-23 ENCOUNTER — Other Ambulatory Visit: Payer: Self-pay | Admitting: Nurse Practitioner
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
## 2021-04-06 ENCOUNTER — Ambulatory Visit: Payer: Medicare Other | Admitting: Neurology

## 2021-04-06 ENCOUNTER — Encounter: Payer: Self-pay | Admitting: Neurology

## 2021-04-06 ENCOUNTER — Other Ambulatory Visit: Payer: Self-pay

## 2021-04-06 VITALS — BP 130/88 | HR 71 | Ht 71.0 in | Wt 255.0 lb

## 2021-04-06 DIAGNOSIS — G20 Parkinson's disease: Secondary | ICD-10-CM

## 2021-04-06 MED ORDER — CARBIDOPA-LEVODOPA 25-100 MG PO TABS: 1.0000 | ORAL_TABLET | Freq: Three times a day (TID) | ORAL | 1 refills | Status: DC

## 2021-04-06 NOTE — Patient Instructions (Signed)
Online Resources for Power over Parkinson's Group April 2022  . Local [place] Online Groups  o Power over [employer] Group :   - Power Over Parkinson's Patient Education Group will be Wednesday, April 13th at 2pm via [place].   - Upcoming Power over Parkinson's Meetings:  2nd Wednesdays of the month at 2 pm:  May 11th, June 8th - Contact Amy Marriott at [email or web address] if interested in participating in this online group o Parkinson's Care Partners Group:    3rd Mondays, Contact Misty Palladino o Atypical Parkinsonian Patient Group:   4th Wednesdays, Contact Misty Palladino o If you are interested in participating in these online groups with Misty, please contact her directly for how to join those meetings.  Her contact information is [email or web address]  She will send you a link to join the [employer].    . [place]:  [email or web address] o PD Health at Home continues:  Mindfulness Mondays, Expert Briefing Tuesdays, Wellness Wednesdays, Take Time Thursdays, Fitness Fridays -Listings for March 2022 are on the website o Upcoming Webinar:  Can We Put the Brakes on PD Progression?  Wednesday, April 6th @ 1 pm o Geneticist, molecular) at [email or web address] o  Please check out their website to sign up for emails and see their full online offerings  . [place]:  [email or web address]  o Upcoming Webinar:   New to Parkinson's?  Steps to Take Today.  Thursday, March 21st @ 12 noon o Check out additional information on their website to see their full online offerings  . [place]:  [email or web address] o Upcoming Webinar:  Stay tuned o Care Partner Monthly Meetup.  With [place].  First Tuesday of each month, 2 pm o Check out additional information to Live Well Today on their website  . Parkinson and Movement Disorders (PMD) Alliance:  www.[email or web address] o [employer] Online:   Online Education Events o Sign up for emails, which are sent weekly to give you updates on programming and online offerings     . Parkinson's Association of the [place]:  www.[email or web address] o Information on online support groups, education events, and online exercises including Yoga, Parkinson's exercises and more-LOTS of information on links to PD resources and online events o Virtual Support Group through Parkinson's Association of the [place]; next one is scheduled for Wednesday, March 22, 2021 at 2 pm. (These are typically scheduled for the 1st Wednesday of the month at 2 pm).  Visit website for details.  . Additional links for movement activities: o PWR! Moves Classes at [place] RESUMED!  Wednesdays 10 and 11 am.  Contact Amy Marriott, PT [email or web address] or [phone] if interested o Here is a link to the PWR!Moves classes on Zoom from [phone] - Daily Mon-Sat at 10:00. Via Zoom, FREE and open to all.  There is also a link below via Facebook if you use that platform. - [place] - [email or web address] o Parkinson's Wellness Recovery (PWR! Moves)  www.[email or web address] - Info on the PWR! Virtual Experience:  You will have access to our expertise through self-assessment, guided plans that start with the PD-specific fundamentals, educational content, tips, Q&A with an expert, and a growing PrepaidParty.no of PD-specific pre-recorded and live exercise classes of varying types and intensity - both physical and cognitive! If that is not enough, we offer 1:1 wellness consultations (in-person or virtual) to personalize your PWR! Art therapist.  - Check out the PWR! Move of the month on the [facility] website:  [email or web address] o [employer] Fridays:  - As part of the PD Health @ Home program, this free video series focuses each week on one aspect of fitness designed to support people living with Parkinson's.  These weekly videos highlight the [place] recent fitness guidelines for people with Parkinson's disease. -  [email or web address].dk o Dance for PD website is offering free, live-stream classes throughout the week, as well as links to [employer] of classes:  [email or web address] o Dance for Parkinson's Class:  [place].  Free offering for people with Parkinson's and care partners; virtual class.  o For more information, contact [phone] or email Ruffin Frederick at [email or web address] o Virtual dance and Pilates for Parkinson's classes: Click on the Community Tab> Parkinson's Movement Initiative Tab.  To register for classes and for more information, visit www.[employer] and click the "community" tab.     o [employer] Parkinson's Cycling Classes  - [facility]: 1pm on Fridays-Live classes at [facility] at [email or web address] or [phone]) [phone] [facility]: Virtual Classes Mondays and Thursdays (contact [place] at [email or web address] or [phone])  o [place] levels of classes are offered Tuesdays and Thursdays:  10:30 am,  12 noon & 1:45 pm at [facility].  - Active Stretching with [facility] Class starting in March, on Fridays - To observe a class or for  more information, call [phone] or email [email or web address] . [employer]: o [phone] Opportunities:  [email or web address]/[email or web address]/[email or web address]-support-group.  You may also contact Chief Technology Officer at  [email or web address] or [phone].   o Well[employer]Spring Navigator:  Just1Navigator program, a free service to help individuals and families through the journey of determining care for older adults.  The "Navigator" is a [phone], Education officer, museum, who will speak with a prospective client and/or loved ones to provide an assessment of the situation and a set of recommendations for a personalized care plan -- all free of charge, and whether [employer] offers the needed service or not. If the need is not a service we provide, we are well-connected with reputable programs in town that we can refer you to.  www.well-[email or web address] or to speak with the Navigator, call [phone].

## 2021-04-12 ENCOUNTER — Ambulatory Visit (INDEPENDENT_AMBULATORY_CARE_PROVIDER_SITE_OTHER): Payer: Medicare Other | Admitting: Nurse Practitioner

## 2021-04-12 ENCOUNTER — Encounter: Payer: Self-pay | Admitting: Nurse Practitioner

## 2021-04-12 ENCOUNTER — Other Ambulatory Visit: Payer: Self-pay

## 2021-04-12 VITALS — BP 107/72 | HR 70 | Temp 98.7°F | Wt 253.8 lb

## 2021-04-12 DIAGNOSIS — E538 Deficiency of other specified B group vitamins: Secondary | ICD-10-CM

## 2021-04-12 DIAGNOSIS — E785 Hyperlipidemia, unspecified: Secondary | ICD-10-CM | POA: Diagnosis not present

## 2021-04-12 DIAGNOSIS — F419 Anxiety disorder, unspecified: Secondary | ICD-10-CM

## 2021-04-12 DIAGNOSIS — E1159 Type 2 diabetes mellitus with other circulatory complications: Secondary | ICD-10-CM

## 2021-04-12 DIAGNOSIS — G20 Parkinson's disease: Secondary | ICD-10-CM | POA: Diagnosis not present

## 2021-04-12 DIAGNOSIS — N521 Erectile dysfunction due to diseases classified elsewhere: Secondary | ICD-10-CM

## 2021-04-12 DIAGNOSIS — E1169 Type 2 diabetes mellitus with other specified complication: Secondary | ICD-10-CM

## 2021-04-12 DIAGNOSIS — E669 Obesity, unspecified: Secondary | ICD-10-CM

## 2021-04-12 LAB — BAYER DCA HB A1C WAIVED: HB A1C (BAYER DCA - WAIVED): 5.7 % (ref <7.0)

## 2021-04-12 NOTE — Progress Notes (Signed)
BP 107/72   Pulse 70   Temp 98.7 F (37.1 C) (Oral)   Wt 253 lb 12.8 oz (115.1 kg)   SpO2 95%   BMI 35.40 kg/m    Subjective:    Patient ID: Brian Frederick, male    DOB: 1956/05/09, 65 y.o.   MRN: [id number]  HPI: Brian Frederick is a 65 y.o. male  Chief Complaint  Patient presents with  . Diabetes  . Hyperlipidemia  . Hypertension  . Mood  . Parkinson's Disease   DIABETES Continues onTrulicity 1.5 MG and Metformin XR 1000 MG BID +Levemir 25 units. January 5.6%.  Continues on Gabapentin by podiatry and tolerating well, with exception of some mild edema to BLE.  Has history of B12 and Vit D deficiency -- taking vitamins off and on.  Lost 40 pounds since last year. Hypoglycemic episodes:no Polydipsia/polyuria: no Visual disturbance: no Chest pain: no Paresthesias: no Glucose Monitoring: yes Accucheck frequency: Daily Fasting glucose: 111 this morning and 106 yesterday. Post prandial: Evening:  Before meals: Taking Insulin?: yes Long acting insulin: Levemir 25 units -- he decreased to this Short acting insulin: Blood Pressure Monitoring: weekly Retinal Examination: Up to Date Foot Exam: Up to Date Pneumovax: Up to Date Influenza: Up to Date Aspirin: yes  HYPERTENSION / HYPERLIPIDEMIA Takes Lisinopril 5 MG daily, Metoprolol 100 MG daily, and Lipitor 20 MG. Satisfied with current treatment? yes Duration of hypertension: chronic BP monitoring frequency: occasional BP range: 120-130/80 BP medication side effects: no Duration of hyperlipidemia: chronic Cholesterol medication side effects: no Cholesterol supplements: none Medication compliance: good compliance Aspirin: yes Recent stressors: no Recurrent headaches:no Visual changes: no Palpitations: no Dyspnea: no Chest pain: no Lower extremity edema: none Dizzy/lightheaded: no  PARKINSON'S DISEASE: Diagnosed in  2015.  Sees Dr. Carles Collet, saw last on 04/07/21 -- continues Sinemet 1 tablet TID started, to continue Trihexyphenidyl 2 MG TID. Denies any recent falls or decline in function.  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  Does endorse some issues with ED, had previously been on Viagra and Cialis.  He preferred the Viagra.  Has difficulty achieving and maintaining erection -- would like to restart Viagra.  ANXIETY/STRESS On Lexapro 10 MG daily, stable. Duration:controlled Anxious mood: no  Excessive worrying: no Irritability: no  Sweating: no Nausea: no Palpitations:no Hyperventilation: no Panic attacks: no Agoraphobia: no  Obscessions/compulsions: no Depressed mood: no Anhedonia: no Weight changes: no Insomnia: yes hard to stay asleep  Hypersomnia: no Fatigue/loss of energy: no Feelings of worthlessness: no Feelings of guilt: no Impaired concentration/indecisiveness: no Suicidal ideations: no  Crying spells: no Recent Stressors/Life Changes: no   Relationship problems: no   Family stress: no     Financial stress: no    Job stress: no    Recent death/loss: no Depression screen [facility] 2/9 04/12/2021 01/11/2021 09/28/2020 05/24/2020 05/17/2020  Decreased Interest 0 0 0 0 0  Down, Depressed, Hopeless 0 0 0 0 0  PHQ - 2 Score 0 0 0 0 0  Altered sleeping 0 0 0 - 0  Tired, decreased energy 0 0 0 - 0  Change in appetite 0 0 0 - 0  Feeling bad or failure about yourself  0 0 0 - 0  Trouble concentrating 0 0 0 - 0  Moving slowly or fidgety/restless 0 0 0 - 0  Suicidal thoughts 0 0 0 - 0  PHQ-9 Score 0 0 0 - 0  Difficult doing work/chores  Not difficult at all - - - -  Some recent data might be hidden   GAD 7 : Generalized Anxiety Score 04/12/2021 09/28/2020 05/17/2020 06/03/2019  Nervous, Anxious, on Edge 0 0 0 0  Control/stop worrying 0 0 0 0  Worry too much - different things 0 0 0 0  Trouble relaxing 0 0 0 0  Restless 0 0 0 0  Easily annoyed or  irritable 0 0 0 0  Afraid - awful might happen 0 0 0 0  Total GAD 7 Score 0 0 0 0  Anxiety Difficulty Not difficult at all - Not difficult at all Not difficult at all    Relevant past medical, surgical, family and social history reviewed and updated as indicated. Interim medical history since our last visit reviewed. Allergies and medications reviewed and updated.  Review of Systems  Constitutional: Negative for activity change, diaphoresis, fatigue and fever.  Respiratory: Negative for cough, chest tightness, shortness of breath and wheezing.   Cardiovascular: Negative for chest pain, palpitations and leg swelling.  Gastrointestinal: Negative.   Endocrine: Negative for polydipsia, polyphagia and polyuria.  Neurological: Negative.   Psychiatric/Behavioral: Negative.    Per HPI unless specifically indicated above     Objective:    BP 107/72   Pulse 70   Temp 98.7 F (37.1 C) (Oral)   Wt 253 lb 12.8 oz (115.1 kg)   SpO2 95%   BMI 35.40 kg/m   Wt Readings from Last 3 Encounters:  04/12/21 253 lb 12.8 oz (115.1 kg)  04/06/21 255 lb (115.7 kg)  01/11/21 260 lb 9.6 oz (118.2 kg)    Physical Exam Vitals and nursing note reviewed.  Constitutional:      General: He is awake. He is not in acute distress.    Appearance: He is well-developed and well-groomed. He is morbidly obese. He is not ill-appearing.  HENT:     Head: Normocephalic and atraumatic.     Right Ear: Hearing normal. No drainage.     Left Ear: Hearing normal. No drainage.  Eyes:     General: Lids are normal.        Right eye: No discharge.        Left eye: No discharge.     Conjunctiva/sclera: Conjunctivae normal.     Pupils: Pupils are equal, round, and reactive to light.  Neck:     Thyroid: No thyromegaly.     Vascular: No carotid bruit.  Cardiovascular:     Rate and Rhythm: Normal rate and regular rhythm.     Heart sounds: Normal heart sounds, S1 normal and S2 normal. No murmur heard. No gallop.    Pulmonary:     Effort: Pulmonary effort is normal. No accessory muscle usage or respiratory distress.     Breath sounds: Normal breath sounds.  Abdominal:     General: Bowel sounds are normal.     Palpations: Abdomen is soft.  Musculoskeletal:        General: Normal range of motion.     Cervical back: Normal range of motion and neck supple.     Right lower leg: No edema.     Left lower leg: No edema.  Skin:    General: Skin is warm and dry.     Capillary Refill: Capillary refill takes less than 2 seconds.  Neurological:     Mental Status: He is alert and oriented to person, place, and time.     Motor: Tremor present.  Psychiatric:  Attention and Perception: Attention normal.        Mood and Affect: Mood normal.        Speech: Speech normal.        Behavior: Behavior normal. Behavior is cooperative.        Thought Content: Thought content normal.    Results for orders placed or performed in visit on 01/11/21  Bayer DCA Hb A1c Waived  Result Value Ref Range   HB A1C (BAYER DCA - WAIVED) 5.6 <3.7 %  Basic metabolic panel  Result Value Ref Range   Glucose 108 (H) 65 - 99 mg/dL   BUN 27 8 - 27 mg/dL   Creatinine, Ser 1.21 0.76 - 1.27 mg/dL   GFR calc non Af Amer 62 >59 mL/min/1.73   GFR calc Af Amer 72 >59 mL/min/1.73   BUN/Creatinine Ratio 22 10 - 24   Sodium 142 134 - 144 mmol/L   Potassium 4.1 3.5 - 5.2 mmol/L   Chloride 101 96 - 106 mmol/L   CO2 27 20 - 29 mmol/L   Calcium 8.2 (L) 8.6 - 10.2 mg/dL  Microalbumin, Urine Waived  Result Value Ref Range   Microalb, Ur Waived 30 (H) 0 - 19 mg/L   Creatinine, Urine Waived 100 10 - 300 mg/dL   Microalb/Creat Ratio <30 <30 mg/g  Lipid Panel w/o Chol/HDL Ratio  Result Value Ref Range   Cholesterol, Total 110 100 - 199 mg/dL   Triglycerides 158 (H) 0 - 149 mg/dL   HDL 30 (L) >39 mg/dL   VLDL Cholesterol Cal 27 5 - 40 mg/dL   LDL Chol Calc (NIH) 53 0 - 99 mg/dL  Vitamin B12  Result Value Ref Range   Vitamin B-12 609  232 - 1,245 pg/mL      Assessment & Plan:   Problem List Items Addressed This Visit      Cardiovascular and Mediastinum   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HCC)    Chronic, ongoing with A1C 5.7% today, remaining stable. Urine ALB 30 last visit, continue Lisinopril. Has lost almost 40 pounds over past year, praised for this.  Continue Trulicity 1.5 MG weekly and to continue Metformin and Levemir at current doses, but have advised him to continue to reduce Levemir by 3 units every 3 days if BS fasting is <130 -- with goal to discontinue insulin over time -- discussed at length with him.  Continue Gabapentin for neuropathy. Will consider increase in Trulicity to 3 MG weekly OR addition of SGLT2 for cardiac health to maintain A1c goal and reduce insulin with goal to d/c insulin. Return in 3 months for A1C check.        Relevant Orders   Bayer DCA Hb A1c Waived     Endocrine   Hyperlipidemia associated with type 2 diabetes mellitus (HCC)    Chronic, ongoing.  Continue current medication regimen and adjust as needed.  Lipid panel next visit, recent LDL 53.        Relevant Orders   Bayer DCA Hb A1c Waived   Type 2 diabetes mellitus with obesity ([place]) - Primary    Chronic, ongoing with A1C 5.7% today, remaining stable. Urine ALB 30 last visit, continue Lisinopril. Has lost almost 40 pounds over past year, praised for this.  Continue Trulicity 1.5 MG weekly and to continue Metformin and Levemir at current doses, but have advised him to continue to reduce Levemir by 3 units every 3 days if BS fasting is <130 -- with  goal to discontinue insulin over time -- discussed at length with him.  Continue Gabapentin for neuropathy. Will consider increase in Trulicity to 3 MG weekly OR addition of SGLT2 for cardiac health to maintain A1c goal and reduce insulin with goal to d/c insulin. Return in 3 months for A1C check.        Relevant Orders   Bayer DCA Hb A1c Waived     Nervous and  Auditory   Parkinson's disease ([place])    Followed by neurology, continue collaboration and current medication regimen as prescribed by them.  Recent note reviewed.  He reports feeling better after recent medication changes by neurology.        Other   Morbid obesity ([place])    With T2DM and HTN, has lost total of 40 pounds -- praised for this.  Recommended eating smaller high protein, low fat meals more frequently and exercising 30 mins a day 5 times a week with a goal of 10-15lb weight loss in the next 3 months. Patient voiced their understanding and motivation to adhere to these recommendations.       ED (erectile dysfunction)    Ongoing related to underlying chronic disease processes.  Refills on Viagra sent as needed.      Chronic anxiety    Chronic, stable on Lexapro.  Continue low dose medication and monitor.  Denies SI/HI.      Vitamin B12 deficiency    Noted low normal on October 2021 labs -- continue supplement and recheck next visit as is on chronic Metformin and concern for reduction in this level with chronic use.          Follow up plan: Return in about 3 months (around 07/12/2021) for Annual physical with diabetes check.

## 2021-04-12 NOTE — Assessment & Plan Note (Signed)
Ongoing related to underlying chronic disease processes.  Refills on Viagra sent as needed.

## 2021-04-12 NOTE — Assessment & Plan Note (Signed)
Noted low normal on October 2021 labs -- continue supplement and recheck next visit as is on chronic Metformin and concern for reduction in this level with chronic use.

## 2021-04-12 NOTE — Assessment & Plan Note (Signed)
Chronic, ongoing with A1C 5.7% today, remaining stable. Urine ALB 30 last visit, continue Lisinopril. Has lost almost 40 pounds over past year, praised for this.  Continue Trulicity 1.5 MG weekly and to continue Metformin and Levemir at current doses, but have advised him to continue to reduce Levemir by 3 units every 3 days if BS fasting is <130 -- with goal to discontinue insulin over time -- discussed at length with him.  Continue Gabapentin for neuropathy. Will consider increase in Trulicity to 3 MG weekly OR addition of SGLT2 for cardiac health to maintain A1c goal and reduce insulin with goal to d/c insulin. Return in 3 months for A1C check.   

## 2021-04-12 NOTE — Assessment & Plan Note (Addendum)
Chronic, stable on Lexapro.  Continue low dose medication and monitor.  Denies SI/HI. 

## 2021-04-12 NOTE — Assessment & Plan Note (Signed)
Followed by neurology, continue collaboration and current medication regimen as prescribed by them.  Recent note reviewed.  He reports feeling better after recent medication changes by neurology.

## 2021-04-12 NOTE — Assessment & Plan Note (Signed)
With T2DM and HTN, has lost total of 40 pounds -- praised for this.  Recommended eating smaller high protein, low fat meals more frequently and exercising 30 mins a day 5 times a week with a goal of 10-15lb weight loss in the next 3 months. Patient voiced their understanding and motivation to adhere to these recommendations.

## 2021-04-12 NOTE — Patient Instructions (Signed)
Diabetes Mellitus and Nutrition, Adult When you have diabetes, or diabetes mellitus, it is very important to have healthy eating habits because your blood sugar (glucose) levels are greatly affected by what you eat and drink. Eating healthy foods in the right amounts, at about the same times every day, can help you:  Control your blood glucose.  Lower your risk of heart disease.  Improve your blood pressure.  Reach or maintain a healthy weight. What can affect my meal plan? Every person with diabetes is different, and each person has different needs for a meal plan. Your health care provider may recommend that you work with a dietitian to make a meal plan that is best for you. Your meal plan may vary depending on factors such as:  The calories you need.  The medicines you take.  Your weight.  Your blood glucose, blood pressure, and cholesterol levels.  Your activity level.  Other health conditions you have, such as heart or kidney disease. How do carbohydrates affect me? Carbohydrates, also called carbs, affect your blood glucose level more than any other type of food. Eating carbs naturally raises the amount of glucose in your blood. Carb counting is a method for keeping track of how many carbs you eat. Counting carbs is important to keep your blood glucose at a healthy level, especially if you use insulin or take certain oral diabetes medicines. It is important to know how many carbs you can safely have in each meal. This is different for every person. Your dietitian can help you calculate how many carbs you should have at each meal and for each snack. How does alcohol affect me? Alcohol can cause a sudden decrease in blood glucose (hypoglycemia), especially if you use insulin or take certain oral diabetes medicines. Hypoglycemia can be a life-threatening condition. Symptoms of hypoglycemia, such as sleepiness, dizziness, and confusion, are similar to symptoms of having too much  alcohol.  Do not drink alcohol if: ? Your health care provider tells you not to drink. ? You are pregnant, may be pregnant, or are planning to become pregnant.  If you drink alcohol: ? Do not drink on an empty stomach. ? Limit how much you use to:  0-1 drink a day for women.  0-2 drinks a day for men. ? Be aware of how much alcohol is in your drink. In the [place] one drink equals one 12 oz bottle of beer (355 mL), one 5 oz glass of wine (148 mL), or one 1 oz glass of hard liquor (44 mL). ? Keep yourself hydrated with water, diet soda, or unsweetened iced tea.  Keep in mind that regular soda, juice, and other mixers may contain a lot of sugar and must be counted as carbs. What are tips for following this plan? Reading food labels  Start by checking the serving size on the "Nutrition Facts" label of packaged foods and drinks. The amount of calories, carbs, fats, and other nutrients listed on the label is based on one serving of the item. Many items contain more than one serving per package.  Check the total grams (g) of carbs in one serving. You can calculate the number of servings of carbs in one serving by dividing the total carbs by 15. For example, if a food has 30 g of total carbs per serving, it would be equal to 2 servings of carbs.  Check the number of grams (g) of saturated fats and trans fats in one serving. Choose foods that have   a low amount or none of these fats.  Check the number of milligrams (mg) of salt (sodium) in one serving. Most people should limit total sodium intake to less than 2,300 mg per day.  Always check the nutrition information of foods labeled as "low-fat" or "nonfat." These foods may be higher in added sugar or refined carbs and should be avoided.  Talk to your dietitian to identify your daily goals for nutrients listed on the label. Shopping  Avoid buying canned, pre-made, or processed foods. These foods tend to be high in fat, sodium, and added  sugar.  Shop around the outside edge of the grocery store. This is where you will most often find fresh fruits and vegetables, bulk grains, fresh meats, and fresh dairy. Cooking  Use low-heat cooking methods, such as baking, instead of high-heat cooking methods like deep frying.  Cook using healthy oils, such as olive, canola, or sunflower oil.  Avoid cooking with butter, cream, or high-fat meats. Meal planning  Eat meals and snacks regularly, preferably at the same times every day. Avoid going long periods of time without eating.  Eat foods that are high in fiber, such as fresh fruits, vegetables, beans, and whole grains. Talk with your dietitian about how many servings of carbs you can eat at each meal.  Eat 4-6 oz (112-168 g) of lean protein each day, such as lean meat, chicken, fish, eggs, or tofu. One ounce (oz) of lean protein is equal to: ? 1 oz (28 g) of meat, chicken, or fish. ? 1 egg. ?  cup (62 g) of tofu.  Eat some foods each day that contain healthy fats, such as avocado, nuts, seeds, and fish.   What foods should I eat? Fruits Berries. Apples. Oranges. Peaches. Apricots. Plums. Grapes. Mango. Papaya. Pomegranate. Kiwi. Cherries. Vegetables Lettuce. Spinach. Leafy greens, including kale, chard, collard greens, and mustard greens. Beets. Cauliflower. Cabbage. Broccoli. Carrots. Green beans. Tomatoes. Peppers. Onions. Cucumbers. Brussels sprouts. Grains Whole grains, such as whole-wheat or whole-grain bread, crackers, tortillas, cereal, and pasta. Unsweetened oatmeal. Quinoa. Brown or wild rice. Meats and other proteins Seafood. Poultry without skin. Lean cuts of poultry and beef. Tofu. Nuts. Seeds. Dairy Low-fat or fat-free dairy products such as milk, yogurt, and cheese. The items listed above may not be a complete list of foods and beverages you can eat. Contact a dietitian for more information. What foods should I avoid? Fruits Fruits canned with  syrup. Vegetables Canned vegetables. Frozen vegetables with butter or cream sauce. Grains Refined white flour and flour products such as bread, pasta, snack foods, and cereals. Avoid all processed foods. Meats and other proteins Fatty cuts of meat. Poultry with skin. Breaded or fried meats. Processed meat. Avoid saturated fats. Dairy Full-fat yogurt, cheese, or milk. Beverages Sweetened drinks, such as soda or iced tea. The items listed above may not be a complete list of foods and beverages you should avoid. Contact a dietitian for more information. Questions to ask a health care provider  Do I need to meet with a diabetes educator?  Do I need to meet with a dietitian?  What number can I call if I have questions?  When are the best times to check my blood glucose? Where to find more information:  American Diabetes Association: diabetes.org  Academy of Nutrition and Dietetics: www.eatright.org  National Institute of Diabetes and Digestive and Kidney Diseases: www.niddk.nih.gov  Association of Diabetes Care and Education Specialists: www.diabeteseducator.org Summary  It is important to have healthy eating   habits because your blood sugar (glucose) levels are greatly affected by what you eat and drink.  A healthy meal plan will help you control your blood glucose and maintain a healthy lifestyle.  Your health care provider may recommend that you work with a dietitian to make a meal plan that is best for you.  Keep in mind that carbohydrates (carbs) and alcohol have immediate effects on your blood glucose levels. It is important to count carbs and to use alcohol carefully. This information is not intended to replace advice given to you by your health care provider. Make sure you discuss any questions you have with your health care provider. Document Revised: 11/10/2019 Document Reviewed: 11/10/2019 Elsevier Patient Education  2021 Elsevier Inc.  

## 2021-04-12 NOTE — Assessment & Plan Note (Signed)
Chronic, ongoing with A1C 5.7% today, remaining stable. Urine ALB 30 last visit, continue Lisinopril. Has lost almost 40 pounds over past year, praised for this.  Continue Trulicity 1.5 MG weekly and to continue Metformin and Levemir at current doses, but have advised him to continue to reduce Levemir by 3 units every 3 days if BS fasting is <130 -- with goal to discontinue insulin over time -- discussed at length with him.  Continue Gabapentin for neuropathy. Will consider increase in Trulicity to 3 MG weekly OR addition of SGLT2 for cardiac health to maintain A1c goal and reduce insulin with goal to d/c insulin. Return in 3 months for A1C check.

## 2021-04-12 NOTE — Assessment & Plan Note (Signed)
Chronic, ongoing.  Continue current medication regimen and adjust as needed.  Lipid panel next visit, recent LDL 53.

## 2021-04-14 ENCOUNTER — Other Ambulatory Visit: Payer: Self-pay | Admitting: Neurology

## 2021-05-14 ENCOUNTER — Other Ambulatory Visit: Payer: Self-pay | Admitting: Nurse Practitioner

## 2021-05-14 NOTE — Telephone Encounter (Signed)
Requested Prescriptions  Pending Prescriptions Disp Refills  . lansoprazole (PREVACID) 30 MG capsule [Pharmacy Med Name: LANSOPRAZOLE 30MG  DR CAPSULES] 90 capsule 1    Sig: TAKE 1 CAPSULE BY MOUTH DAILY AT 12 NOON     Gastroenterology: Proton Pump Inhibitors Passed - 05/14/2021  6:29 AM      Passed - Valid encounter within last 12 months    Recent Outpatient Visits          1 month ago Type 2 diabetes mellitus with obesity ([place])   [place], Jolene T, NP   4 months ago Type 2 diabetes mellitus with obesity ([place])   [place], Jolene T, NP   7 months ago 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[place])   [place] Cannady, Barbaraann Faster, NP   11 months ago Physical exam, annual   [employer], Jolene T, NP   12 months ago Type 2 diabetes mellitus with diabetic mononeuropathy, with long-term current use of insulin ([place])   [place], Barbaraann Faster, NP      Future Appointments            In 2 months Cannady, Barbaraann Faster, NP [facility]

## 2021-06-17 ENCOUNTER — Other Ambulatory Visit: Payer: Self-pay | Admitting: Nurse Practitioner

## 2021-06-18 NOTE — Telephone Encounter (Signed)
Requested Prescriptions  Pending Prescriptions Disp Refills  . gabapentin (NEURONTIN) 100 MG capsule [Pharmacy Med Name: GABAPENTIN 100MG  CAPSULES] 180 capsule 2    Sig: TAKE 1 CAPSULE(100 MG) BY MOUTH TWICE DAILY     Neurology: Anticonvulsants - gabapentin Passed - 06/17/2021  8:11 PM      Passed - Valid encounter within last 12 months    Recent Outpatient Visits          2 months ago Type 2 diabetes mellitus with obesity ([place])   [place], Jolene T, NP   5 months ago Type 2 diabetes mellitus with obesity ([place])   [place], Jolene T, NP   8 months ago 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[place])   [place] Cannady, Barbaraann Faster, NP   1 year ago Physical exam, annual   [place], [place] T, NP   1 year ago Type 2 diabetes mellitus with diabetic mononeuropathy, with long-term current use of insulin ([place])   [place], Barbaraann Faster, NP      Future Appointments            In 3 weeks Cannady, Barbaraann Faster, NP [facility]

## 2021-07-05 ENCOUNTER — Other Ambulatory Visit: Payer: Self-pay | Admitting: Nurse Practitioner

## 2021-07-13 ENCOUNTER — Other Ambulatory Visit: Payer: Self-pay | Admitting: Neurology

## 2021-07-13 ENCOUNTER — Encounter: Payer: Medicare Other | Admitting: Nurse Practitioner

## 2021-08-07 ENCOUNTER — Ambulatory Visit (INDEPENDENT_AMBULATORY_CARE_PROVIDER_SITE_OTHER): Payer: Medicare Other | Admitting: Nurse Practitioner

## 2021-08-07 ENCOUNTER — Encounter: Payer: Self-pay | Admitting: Nurse Practitioner

## 2021-08-07 ENCOUNTER — Other Ambulatory Visit: Payer: Self-pay

## 2021-08-07 ENCOUNTER — Ambulatory Visit: Payer: Medicare Other | Admitting: Dermatology

## 2021-08-07 VITALS — BP 111/74 | HR 73 | Temp 98.8°F | Ht 70.5 in | Wt 252.8 lb

## 2021-08-07 DIAGNOSIS — L570 Actinic keratosis: Secondary | ICD-10-CM | POA: Diagnosis not present

## 2021-08-07 DIAGNOSIS — E1169 Type 2 diabetes mellitus with other specified complication: Secondary | ICD-10-CM | POA: Diagnosis not present

## 2021-08-07 DIAGNOSIS — L918 Other hypertrophic disorders of the skin: Secondary | ICD-10-CM | POA: Diagnosis not present

## 2021-08-07 DIAGNOSIS — Z0000 Encounter for general adult medical examination without abnormal findings: Secondary | ICD-10-CM

## 2021-08-07 DIAGNOSIS — D229 Melanocytic nevi, unspecified: Secondary | ICD-10-CM | POA: Diagnosis not present

## 2021-08-07 DIAGNOSIS — Z1283 Encounter for screening for malignant neoplasm of skin: Secondary | ICD-10-CM | POA: Diagnosis not present

## 2021-08-07 DIAGNOSIS — E785 Hyperlipidemia, unspecified: Secondary | ICD-10-CM

## 2021-08-07 DIAGNOSIS — G20 Parkinson's disease: Secondary | ICD-10-CM

## 2021-08-07 DIAGNOSIS — D1800 Hemangioma unspecified site: Secondary | ICD-10-CM

## 2021-08-07 DIAGNOSIS — K219 Gastro-esophageal reflux disease without esophagitis: Secondary | ICD-10-CM | POA: Diagnosis not present

## 2021-08-07 DIAGNOSIS — Z85828 Personal history of other malignant neoplasm of skin: Secondary | ICD-10-CM

## 2021-08-07 DIAGNOSIS — E669 Obesity, unspecified: Secondary | ICD-10-CM

## 2021-08-07 DIAGNOSIS — L578 Other skin changes due to chronic exposure to nonionizing radiation: Secondary | ICD-10-CM

## 2021-08-07 DIAGNOSIS — L814 Other melanin hyperpigmentation: Secondary | ICD-10-CM | POA: Diagnosis not present

## 2021-08-07 DIAGNOSIS — E538 Deficiency of other specified B group vitamins: Secondary | ICD-10-CM

## 2021-08-07 DIAGNOSIS — L821 Other seborrheic keratosis: Secondary | ICD-10-CM

## 2021-08-07 DIAGNOSIS — E1159 Type 2 diabetes mellitus with other circulatory complications: Secondary | ICD-10-CM

## 2021-08-07 DIAGNOSIS — F419 Anxiety disorder, unspecified: Secondary | ICD-10-CM

## 2021-08-07 DIAGNOSIS — N400 Enlarged prostate without lower urinary tract symptoms: Secondary | ICD-10-CM

## 2021-08-07 LAB — BAYER DCA HB A1C WAIVED: HB A1C (BAYER DCA - WAIVED): 5.9 % (ref <7.0)

## 2021-08-07 MED ORDER — LANSOPRAZOLE 30 MG PO CPDR: DELAYED_RELEASE_CAPSULE | ORAL | 4 refills | Status: DC

## 2021-08-07 MED ORDER — FLUOROURACIL 5 % EX CREA
TOPICAL_CREAM | Freq: Two times a day (BID) | CUTANEOUS | 1 refills | Status: DC
Start: 2021-08-07 — End: 2022-08-08

## 2021-08-07 MED ORDER — LEVEMIR FLEXTOUCH 100 UNIT/ML ~~LOC~~ SOPN: PEN_INJECTOR | SUBCUTANEOUS | 5 refills | Status: DC

## 2021-08-07 NOTE — Assessment & Plan Note (Signed)
Chronic, stable on Lexapro.  Continue low dose medication and monitor.  Denies SI/HI. 

## 2021-08-07 NOTE — Assessment & Plan Note (Signed)
Noted low normal on October 2021 labs -- continue supplement and recheck next visit as is on chronic Metformin and concern for reduction in this level with chronic use.

## 2021-08-07 NOTE — Assessment & Plan Note (Signed)
No current medications, PSA check today. 

## 2021-08-07 NOTE — Assessment & Plan Note (Signed)
Chronic, ongoing with A1C 5.9% today, remaining stable and well below goal. Urine ALB 30 in 2022, continue Lisinopril. Has lost >  40 pounds over past year, praised for this.  Continue Trulicity 1.5 MG weekly and to continue Metformin and Levemir at current doses, but have advised him to continue to reduce Levemir by 3 units every 3 days if BS fasting is <130 -- with goal to discontinue insulin over time -- discussed at length with him.  Continue Gabapentin for neuropathy. Will consider increase in Trulicity to 3 MG weekly OR addition of SGLT2 for cardiac health to maintain A1c goal and reduce insulin with goal to d/c insulin. CCM referral in place. Return in 3 months for A1C check.

## 2021-08-07 NOTE — Assessment & Plan Note (Signed)
Followed by neurology, continue collaboration and current medication regimen as prescribed by them.  Recent note reviewed.

## 2021-08-07 NOTE — Assessment & Plan Note (Signed)
Chronic, stable. Continue current medication regimen and adjust as needed.  Mag level today. 

## 2021-08-07 NOTE — Assessment & Plan Note (Signed)
BMI 35.76 with T2DM, HTN/HLD.  Recommended eating smaller high protein, low fat meals more frequently and exercising 30 mins a day 5 times a week with a goal of 10-15lb weight loss in the next 3 months. Patient voiced their understanding and motivation to adhere to these recommendations.

## 2021-08-07 NOTE — Progress Notes (Signed)
BP 111/74   Pulse 73   Temp 98.8 F (37.1 C) (Oral)   Ht 5' 10.5" (1.791 m)   Wt 252 lb 12.8 oz (114.7 kg)   SpO2 95%   BMI 35.76 kg/m    Subjective:    Patient ID: Brian Frederick, male    DOB: Aug 21, 1956, 65 y.o.   MRN: [id number]  HPI: Brian Frederick is a 65 y.o. male presenting on 08/07/2021 for comprehensive medical examination. Current medical complaints include:none  He currently lives with: wife Interim Problems from his last visit: no   Does report head bump with dog recently and since then sees like a floating object at times with blinking.   DIABETES Taking Metformin 1000 MG BID, Levemir 16 units, and Trulicity 1.5 MG.  Last A1c April 5.7%.  Has been reducing insulin as provider instructed.  Does endorse needing help with Trulicity due to donut hole.   Does take Gabapentin 100 MG BID for neuropathy. Hypoglycemic episodes:no Polydipsia/polyuria: no Visual disturbance: no Chest pain: no Paresthesias: no Glucose Monitoring: yes             Accucheck frequency: Daily             Fasting glucose: 99 this morning             Post prandial:             Evening:             Before meals: Taking Insulin?: yes             Long acting insulin: Levemir 16 units now, was 35 units             Short acting insulin: Blood Pressure Monitoring: weekly Retinal Examination: Not Up to Date -- [facility] Foot Exam: Up to Date Pneumovax: Up to Date Influenza: Up to Date Aspirin: yes    HYPERTENSION / HYPERLIPIDEMIA Takes Lisinopril 5 MG daily, Metoprolol 100 MG daily, and Lipitor 20 MG + ASA. Satisfied with current treatment? yes Duration of hypertension: chronic BP monitoring frequency: not checking BP range:  BP medication side effects: no Duration of hyperlipidemia: chronic Cholesterol medication side effects: no Cholesterol supplements: none Medication compliance: good compliance Aspirin: yes Recent stressors: no Recurrent headaches: no Visual changes:  no Palpitations: no Dyspnea: no Chest pain: no Lower extremity edema: occasional Dizzy/lightheaded: no    PARKINSON"S DISEASE: Diagnosed in 2015.  Sees Dr. Carles Collet. Denies any recent falls or decline in function.  Reports good control on current regimen.  Saw Dr. Carles Collet last on 04/06/21 and no changes were made. Goes to dermatology and saw them this morning.   ANXIETY/STRESS On Lexapro 10 MG daily. Duration:controlled Anxious mood: no  Excessive worrying: no Irritability: no  Sweating: no Nausea: no Palpitations:no Hyperventilation: no Panic attacks: no Agoraphobia: no  Obscessions/compulsions: no Depressed mood: no Anhedonia: no Weight changes: no Insomnia: yes hard to stay asleep  Hypersomnia: no Fatigue/loss of energy: no Feelings of worthlessness: no Feelings of guilt: no Impaired concentration/indecisiveness: no Suicidal ideations: no  Crying spells: no Recent Stressors/Life Changes: no   Relationship problems: no   Family stress: no     Financial stress: no    Job stress: no    Recent death/loss: no Depression screen [facility] 2/9 08/07/2021 04/12/2021 01/11/2021 09/28/2020 05/24/2020  Decreased Interest 0 0 0 0 0  Down, Depressed, Hopeless 0 0 0 0 0  PHQ - 2 Score 0 0 0 0 0  Altered sleeping -  0 0 0 -  Tired, decreased energy - 0 0 0 -  Change in appetite - 0 0 0 -  Feeling bad or failure about yourself  - 0 0 0 -  Trouble concentrating - 0 0 0 -  Moving slowly or fidgety/restless - 0 0 0 -  Suicidal thoughts - 0 0 0 -  PHQ-9 Score - 0 0 0 -  Difficult doing work/chores - Not difficult at all - - -  Some recent data might be hidden     GERD On Prevacid 30 MG daily. GERD control status: stable  Satisfied with current treatment? yes Heartburn frequency:  Medication side effects: no  Medication compliance: stable Dysphagia: no Odynophagia:  no Hematemesis: no Blood in stool: no EGD: no   Functional Status Survey: Is the patient deaf or have difficulty hearing?:  No Does the patient have difficulty seeing, even when wearing glasses/contacts?: No Does the patient have difficulty concentrating, remembering, or making decisions?: No Does the patient have difficulty walking or climbing stairs?: No Does the patient have difficulty dressing or bathing?: No Does the patient have difficulty doing errands alone such as visiting a doctor's office or shopping?: No  FALL RISK: Fall Risk  04/06/2021 01/11/2021 10/25/2020 06/10/2020 05/24/2020  Falls in the past year? 0 0 0 0 0  Number falls in past yr: 0 - 0 0 0  Injury with Fall? 0 - 0 0 0  Follow up - - - - -    Advanced Directives <no information>  Past Medical History:  Past Medical History:  Diagnosis Date   Achilles tendon disorder    Anxiety    Basal cell carcinoma 06/23/2008   Mid dorsum nose. Ulcerated.   BPH (benign prostatic hypertrophy)    Cancer (HCC)    basal cell   Chronic kidney disease    Hyperlipidemia    Hypertension    Hypogonadism in male    Osteoarthritis    Prediabetes    Squamous cell carcinoma of skin 10/27/2013   Right distal lateral thigh. SCCis   SVT (supraventricular tachycardia) (HCC)     Surgical History:  Past Surgical History:  Procedure Laterality Date   BASAL CELL CARCINOMA EXCISION     cardio oblation     CHOLECYSTECTOMY     COLONOSCOPY WITH PROPOFOL N/A 04/12/2017   Procedure: COLONOSCOPY WITH PROPOFOL;  Surgeon: [name], MD;  Location: [facility];  Service: Endoscopy;  Laterality: N/A;   JOINT REPLACEMENT Left    shoulder   JOINT REPLACEMENT Right    shoulder   POLYPECTOMY N/A 04/12/2017   Procedure: POLYPECTOMY;  Surgeon: [name], MD;  Location: [facility];  Service: Endoscopy;  Laterality: N/A;   SHOULDER SURGERY Right 11/28/2018    Medications:  Current Outpatient Medications on File Prior to Visit  Medication Sig   aspirin 81 MG tablet Take 81 mg by mouth daily.   atorvastatin (LIPITOR) 20 MG tablet Take 1 tablet (20 mg  total) by mouth daily.   BD PEN NEEDLE NANO 2ND GEN 32G X 4 MM MISC USE 1 PEN NEEDLE EVERY MORNING   blood glucose meter kit and supplies KIT Dispense based on patient and insurance preference. Use up to four times daily as directed. (FOR ICD-9 250.00, 250.01).   carbidopa-levodopa (SINEMET IR) 25-100 MG tablet Take 1 tablet by mouth 3 (three) times daily.   Dulaglutide (TRULICITY) 1.5 OI/7.1IW SOPN ADMINISTER 1.5 MG UNDER THE SKIN 1 TIME A WEEK   escitalopram (  LEXAPRO) 10 MG tablet Take 1 tablet (10 mg total) by mouth daily.   fluorouracil (EFUDEX) 5 % cream Apply topically 2 (two) times daily. For 1 week to temples and ears   gabapentin (NEURONTIN) 100 MG capsule TAKE 1 CAPSULE(100 MG) BY MOUTH TWICE DAILY   Lancets (ONETOUCH DELICA PLUS GGYIRS85I) MISC 1 each by Other route 2 (two) times daily.   lisinopril (ZESTRIL) 5 MG tablet TAKE 1 TABLET(5 MG) BY MOUTH DAILY   meloxicam (MOBIC) 15 MG tablet Take 1 tablet (15 mg total) by mouth daily. (Patient taking differently: Take 15 mg by mouth daily as needed for pain.)   metFORMIN (GLUCOPHAGE-XR) 500 MG 24 hr tablet TAKE 2 TABLETS(1000 MG) BY MOUTH TWICE DAILY.   metoprolol succinate (TOPROL-XL) 100 MG 24 hr tablet TAKE 1 TABLET BY MOUTH EVERY DAY WITH FOOD OR IMMEDIATELY AFTER A MEAL   Multiple Vitamin (MULTIVITAMIN) tablet Take 1 tablet by mouth daily.   ONETOUCH VERIO test strip USE TO TEST BLOOD SUGAR UP TO FOUR TIMES DAILY AS DIRECTED   sildenafil (VIAGRA) 100 MG tablet Take 0.5-1 tablets (50-100 mg total) by mouth daily as needed for erectile dysfunction.   trihexyphenidyl (ARTANE) 2 MG tablet TAKE 1 TABLET(2 MG) BY MOUTH THREE TIMES DAILY WITH MEALS   No current facility-administered medications on file prior to visit.    Allergies:  Allergies  Allergen Reactions   Amoxil [Amoxicillin] Swelling    Did it involve swelling of the face/tongue/throat, SOB, or low BP? No Did it involve sudden or severe rash/hives, skin peeling, or any  reaction on the inside of your mouth or nose? No Did you need to seek medical attention at a hospital or doctor's office? No When did it last happen?      15 years ago If all above answers are "NO", may proceed with cephalosporin use.    Penicillin G Benzathine Swelling    Did it involve swelling of the face/tongue/throat, SOB, or low BP? No Did it involve sudden or severe rash/hives, skin peeling, or any reaction on the inside of your mouth or nose? No Did you need to seek medical attention at a hospital or doctor's office? No When did it last happen?      15 years ago If all above answers are "NO", may proceed with cephalosporin use.     Social History:  Social History   Socioeconomic History   Marital status: Married    Spouse name: Not on file   Number of children: 3   Years of education: Not on file   Highest education level: Associate degree: occupational, Hotel manager, or vocational program  Occupational History   Occupation: retired    Comment: Dealer  Tobacco Use   Smoking status: Never   Smokeless tobacco: Never  Scientific laboratory technician Use: Never used  Substance and Sexual Activity   Alcohol use: No    Alcohol/week: 0.0 standard drinks   Drug use: No   Sexual activity: Yes  Other Topics Concern   Not on file  Social History Narrative   Married lives with spouse   Has 3 kids biological 1 step child   Right handed   Drinks coffee 2 cups a day, sweet tea with meals, 2-3 soda's a day   Social Determinants of Radio broadcast assistant Strain: Not on file  Food Insecurity: Not on file  Transportation Needs: Not on file  Physical Activity: Not on file  Stress: Not on file  Social Connections:  Not on file  Intimate Partner Violence: Not on file   Social History   Tobacco Use  Smoking Status Never  Smokeless Tobacco Never   Social History   Substance and Sexual Activity  Alcohol Use No   Alcohol/week: 0.0 standard drinks    Family History:  Family  History  Problem Relation Age of Onset   Cancer Mother        breast   Heart disease Father 33       CABG   Diabetes Father    CAD Father    Hyperlipidemia Father    Hypertension Father    Cancer Sister        breast   Liver cancer Sister    Hypothyroidism Sister    Stroke Maternal Grandfather    Diabetes Paternal Grandmother     Past medical history, surgical history, medications, allergies, family history and social history reviewed with patient today and changes made to appropriate areas of the chart.   Review of Systems - negative All other ROS negative except what is listed above and in the HPI.      Objective:    BP 111/74   Pulse 73   Temp 98.8 F (37.1 C) (Oral)   Ht 5' 10.5" (1.791 m)   Wt 252 lb 12.8 oz (114.7 kg)   SpO2 95%   BMI 35.76 kg/m   Wt Readings from Last 3 Encounters:  08/07/21 252 lb 12.8 oz (114.7 kg)  04/12/21 253 lb 12.8 oz (115.1 kg)  04/06/21 255 lb (115.7 kg)    Physical Exam Vitals and nursing note reviewed.  Constitutional:      General: He is awake. He is not in acute distress.    Appearance: He is well-developed. He is morbidly obese. He is not ill-appearing.  HENT:     Head: Normocephalic and atraumatic.     Right Ear: Hearing, tympanic membrane, ear canal and external ear normal. No drainage.     Left Ear: Hearing, tympanic membrane, ear canal and external ear normal. No drainage.     Nose: Nose normal.     Mouth/Throat:     Pharynx: Uvula midline.  Eyes:     General: Lids are normal.        Right eye: No discharge.        Left eye: No discharge.     Extraocular Movements: Extraocular movements intact.     Conjunctiva/sclera: Conjunctivae normal.     Pupils: Pupils are equal, round, and reactive to light.     Visual Fields: Right eye visual fields normal and left eye visual fields normal.  Neck:     Thyroid: No thyromegaly.     Vascular: No carotid bruit or JVD.     Trachea: Trachea normal.  Cardiovascular:     Rate  and Rhythm: Normal rate and regular rhythm.     Heart sounds: Normal heart sounds, S1 normal and S2 normal. No murmur heard.   No gallop.  Pulmonary:     Effort: Pulmonary effort is normal. No accessory muscle usage or respiratory distress.     Breath sounds: Normal breath sounds.  Abdominal:     General: Bowel sounds are normal.     Palpations: Abdomen is soft. There is no hepatomegaly or splenomegaly.     Tenderness: There is no abdominal tenderness.  Musculoskeletal:        General: Normal range of motion.     Cervical back: Normal range of motion and neck  supple.     Right lower leg: No edema.     Left lower leg: No edema.  Lymphadenopathy:     Head:     Right side of head: No submental, submandibular, tonsillar, preauricular or posterior auricular adenopathy.     Left side of head: No submental, submandibular, tonsillar, preauricular or posterior auricular adenopathy.     Cervical: No cervical adenopathy.  Skin:    General: Skin is warm and dry.     Capillary Refill: Capillary refill takes less than 2 seconds.     Findings: No rash.  Neurological:     Mental Status: He is alert and oriented to person, place, and time.     Cranial Nerves: Cranial nerves are intact.     Gait: Gait is intact.     Deep Tendon Reflexes: Reflexes are normal and symmetric.     Reflex Scores:      Brachioradialis reflexes are 2+ on the right side and 2+ on the left side.      Patellar reflexes are 2+ on the right side and 2+ on the left side. Psychiatric:        Attention and Perception: Attention normal.        Mood and Affect: Mood normal.        Speech: Speech normal.        Behavior: Behavior normal. Behavior is cooperative.        Thought Content: Thought content normal.        Cognition and Memory: Cognition normal.        Judgment: Judgment normal.   Diabetic Foot Exam - Simple   Simple Foot Form Visual Inspection No deformities, no ulcerations, no other skin breakdown bilaterally:  Yes Sensation Testing See comments: Yes Pulse Check Posterior Tibialis and Dorsalis pulse intact bilaterally: Yes Comments Good hair pattern bilaterally with 2+ pulses.  Sensation left foot 7/10 and right foot 6/10.    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 Urine ALB 30 in 2022, continue Lisinopril. Has lost  > 40 pounds over past year, praised for this.  Continue Trulicity 1.5 MG weekly and to continue Metformin and Levemir at current doses, but have advised him to continue to reduce Levemir by 3 units every 3 days if BS fasting is <130 -- with goal to discontinue insulin over time -- discussed at length with him.  Will consider increase in Trulicity to 3 MG weekly OR addition of SGLT2 for cardiac health to maintain A1c goal and reduce insulin with goal to d/c insulin. CCM referral placed to assist with donut hole period.  Return in 3 months for A1C check.        Relevant Medications   insulin detemir (LEVEMIR FLEXTOUCH) 100 UNIT/ML FlexPen   Other Relevant Orders   Bayer DCA Hb A1c Waived (Completed)   CBC with Differential/Platelet   Comprehensive metabolic panel   TSH   AMB Referral to [facility]     Digestive   GERD (gastroesophageal reflux disease)    Chronic, stable.  Continue current medication regimen and adjust as needed.  Mag level today.      Relevant Medications   lansoprazole (PREVACID) 30 MG capsule   Other Relevant Orders   Magnesium     Endocrine   Hyperlipidemia associated with type 2 diabetes mellitus (HCC)    Chronic, ongoing.  Continue current medication regimen and adjust as  needed.  Lipid panel today, recent LDL 53.        Relevant Medications   insulin detemir (LEVEMIR FLEXTOUCH) 100 UNIT/ML FlexPen   Other Relevant Orders   Bayer DCA Hb A1c Waived (Completed)   Comprehensive metabolic panel   Lipid Panel w/o Chol/HDL Ratio   [facility]   Type 2 diabetes mellitus with obesity (HCC)    Chronic, ongoing with A1C 5.9% today, remaining stable and well below goal. Urine ALB 30 in 2022, continue Lisinopril. Has lost >  40 pounds over past year, praised for this.  Continue Trulicity 1.5 MG weekly and to continue Metformin and Levemir at current doses, but have advised him to continue to reduce Levemir by 3 units every 3 days if BS fasting is <130 -- with goal to discontinue insulin over time -- discussed at length with him.  Continue Gabapentin for neuropathy. Will consider increase in Trulicity to 3 MG weekly OR addition of SGLT2 for cardiac health to maintain A1c goal and reduce insulin with goal to d/c insulin. CCM referral in place. Return in 3 months for A1C check.        Relevant Medications   insulin detemir (LEVEMIR FLEXTOUCH) 100 UNIT/ML FlexPen   Other Relevant Orders   Bayer DCA Hb A1c Waived (Completed)   AMB Referral to [facility] Coordin[facility]     Nervous and Auditory   Parkinson's disease [facility])    Followed by neurology, continue collaboration and current medication regimen as prescribed by them.  Recent note reviewed.         Genitourinary   Benign prostatic hyperplasia    No current medications, PSA check today.      Relevant Orders   PSA     Other   Morbid obesity ([place])    BMI 35.76 with T2DM, HTN/HLD.  Recommended eating smaller high protein, low fat meals more frequently and exercising 30 mins a day 5 times a week with a goal of 10-15lb weight loss in the next 3 months. Patient voiced their understanding and motivation to adhere to these recommendations.       Relevant Medications   insulin detemir  (LEVEMIR FLEXTOUCH) 100 UNIT/ML FlexPen   Chronic anxiety    Chronic, stable on Lexapro.  Continue low dose medication and monitor.  Denies SI/HI.      Vitamin B12 deficiency    Noted low normal on October 2021 labs -- continue supplement and recheck next visit as is on chronic Metformin and concern for reduction in this level with chronic use.      Relevant Orders   Vitamin B12   Other Visit Diagnoses     Annual physical exam       Annual physical today with labs: CBC, CMP, TSH, lipid, PSA.  Health maintenance reviewed with patient.        Discussed aspirin prophylaxis for myocardial infarction prevention and decision was made to continue ASA  LABORATORY TESTING:  Health maintenance labs ordered today as discussed above.   The natural history of prostate cancer and ongoing controversy regarding screening and potential treatment outcomes of prostate  cancer has been discussed with the patient. The meaning of a false positive PSA and a false negative PSA has been discussed. He indicates understanding of the limitations of this screening test and wishes to proceed with screening PSA testing.   IMMUNIZATIONS:   - Tdap: Tetanus vaccination status reviewed: last tetanus booster within 10 years. Next due 11/05/22 - Influenza: Up to date - Pneumovax: Up to date -- next due 01/11/2022 - Prevnar: UP TO DATE - Zostavax vaccine: Up to date  SCREENING: - Colonoscopy: Up to date due next 04/12/22 Discussed with patient purpose of the colonoscopy is to detect colon cancer at curable precancerous or early stages   - AAA Screening: Not applicable  -Hearing Test: Not applicable  -Spirometry: Not applicable   PATIENT COUNSELING:    Sexuality: Discussed sexually transmitted diseases, partner selection, use of condoms, avoidance of unintended pregnancy  and contraceptive alternatives.   Advised to avoid cigarette smoking.  I discussed with the patient that most people either abstain from  alcohol or drink within safe limits (<=14/week and <=4 drinks/occasion for males, <=7/weeks and <= 3 drinks/occasion for females) and that the risk for alcohol disorders and other health effects rises proportionally with the number of drinks per week and how often a drinker exceeds daily limits.  Discussed cessation/primary prevention of drug use and availability of treatment for abuse.   Diet: Encouraged to adjust caloric intake to maintain  or achieve ideal body weight, to reduce intake of dietary saturated fat and total fat, to limit sodium intake by avoiding high sodium foods and not adding table salt, and to maintain adequate dietary potassium and calcium preferably from fresh fruits, vegetables, and low-fat dairy products.    Stressed the importance of regular exercise  Injury prevention: Discussed safety belts, safety helmets, smoke detector, smoking near bedding or upholstery.   Dental health: Discussed importance of regular tooth brushing, flossing, and dental visits.   Follow up plan: NEXT PREVENTATIVE PHYSICAL DUE IN 1 YEAR. Return in about 3 months (around 11/07/2021) for T2DM, HTN/HLD, PD, MOOD.

## 2021-08-07 NOTE — Assessment & Plan Note (Signed)
Chronic, ongoing with A1C 5.9% today, remaining stable and well below goal. Urine ALB 30 in 2022, continue Lisinopril. Has lost  > 40 pounds over past year, praised for this.  Continue Trulicity 1.5 MG weekly and to continue Metformin and Levemir at current doses, but have advised him to continue to reduce Levemir by 3 units every 3 days if BS fasting is <130 -- with goal to discontinue insulin over time -- discussed at length with him.  Will consider increase in Trulicity to 3 MG weekly OR addition of SGLT2 for cardiac health to maintain A1c goal and reduce insulin with goal to d/c insulin. CCM referral placed to assist with donut hole period.  Return in 3 months for A1C check.

## 2021-08-07 NOTE — Patient Instructions (Signed)

## 2021-08-07 NOTE — Progress Notes (Signed)
Follow-Up Visit   Subjective  Brian Frederick is a 65 y.o. male who presents for the following: Annual Exam (History of BCC/SCC - TBSE today). The patient presents for Total-Body Skin Exam (TBSE) for skin cancer screening and mole check.  The following portions of the chart were reviewed this encounter and updated as appropriate:   Tobacco  Allergies  Meds  Problems  Med Hx  Surg Hx  Fam Hx     Review of Systems:  No other skin or systemic complaints except as noted in HPI or Assessment and Plan.  Objective  Well appearing patient in no apparent distress; mood and affect are within normal limits.  A full examination was performed including scalp, head, eyes, ears, nose, lips, neck, chest, axillae, abdomen, back, buttocks, bilateral upper extremities, bilateral lower extremities, hands, feet, fingers, toes, fingernails, and toenails. All findings within normal limits unless otherwise noted below.  Face, ears (15) Erythematous thin papules/macules with gritty scale.    Assessment & Plan   History of Basal Cell Carcinoma of the Skin - No evidence of recurrence today - Recommend regular full body skin exams - Recommend daily broad spectrum sunscreen SPF 30+ to sun-exposed areas, reapply every 2 hours as needed.  - Call if any new or changing lesions are noted between office visits History of Squamous Cell Carcinoma of the Skin - No evidence of recurrence today - No lymphadenopathy - Recommend regular full body skin exams - Recommend daily broad spectrum sunscreen SPF 30+ to sun-exposed areas, reapply every 2 hours as needed.  - Call if any new or changing lesions are noted between office visits  Acrochordons (Skin Tags) - Fleshy, skin-colored pedunculated papules - Benign appearing.  - Observe. - If desired, they can be removed with an in office procedure that is not covered by insurance. - Please call the clinic if you notice any new or changing lesions.  Lentigines -  Scattered tan macules - Due to sun exposure - Benign-appering, observe - Recommend daily broad spectrum sunscreen SPF 30+ to sun-exposed areas, reapply every 2 hours as needed. - Call for any changes  Seborrheic Keratoses - Stuck-on, waxy, tan-brown papules and/or plaques  - Benign-appearing - Discussed benign etiology and prognosis. - Observe - Call for any changes  Melanocytic Nevi - Tan-brown and/or pink-flesh-colored symmetric macules and papules - Benign appearing on exam today - Observation - Call clinic for new or changing moles - Recommend daily use of broad spectrum spf 30+ sunscreen to sun-exposed areas.   Hemangiomas - Red papules - Discussed benign nature - Observe - Call for any changes  Actinic Damage - Chronic condition, secondary to cumulative UV/sun exposure - diffuse scaly erythematous macules with underlying dyspigmentation - Recommend daily broad spectrum sunscreen SPF 30+ to sun-exposed areas, reapply every 2 hours as needed.  - Staying in the shade or wearing long sleeves, sun glasses (UVA+UVB protection) and wide brim hats (4-inch brim around the entire circumference of the hat) are also recommended for sun protection.  - Call for new or changing lesions.  Skin cancer screening performed today.  AK (actinic keratosis) (15) Face, ears  Actinic Damage - Severe, confluent actinic changes with pre-cancerous actinic keratoses  - Severe, chronic, not at goal, secondary to cumulative UV radiation exposure over time - diffuse scaly erythematous macules and papules with underlying dyspigmentation - Discussed Prescription "Field Treatment" for Severe, Chronic Confluent Actinic Changes with Pre-Cancerous Actinic Keratoses Field treatment involves treatment of an entire area of skin  that has confluent Actinic Changes (Sun/ Ultraviolet light damage) and PreCancerous Actinic Keratoses by method of PhotoDynamic Therapy (PDT) and/or prescription Topical Chemotherapy  agents such as 5-fluorouracil, 5-fluorouracil/calcipotriene, and/or imiquimod.  The purpose is to decrease the number of clinically evident and subclinical PreCancerous lesions to prevent progression to development of skin cancer by chemically destroying early precancer changes that may or may not be visible.  It has been shown to reduce the risk of developing skin cancer in the treated area. As a result of treatment, redness, scaling, crusting, and open sores may occur during treatment course. One or more than one of these methods may be used and may have to be used several times to control, suppress and eliminate the PreCancerous changes. Discussed treatment course, expected reaction, and possible side effects. - Recommend daily broad spectrum sunscreen SPF 30+ to sun-exposed areas, reapply every 2 hours as needed.  - Staying in the shade or wearing long sleeves, sun glasses (UVA+UVB protection) and wide brim hats (4-inch brim around the entire circumference of the hat) are also recommended. - Call for new or changing lesions.  In one month, start 5% fluorouracil cream/calcipotriene cream bid x 7 day - sent to [facility] of lesion - Face, ears Complexity: simple   Destruction method: cryotherapy   Informed consent: discussed and consent obtained   Timeout:  patient name, date of birth, surgical site, and procedure verified Lesion destroyed using liquid nitrogen: Yes   Region frozen until ice ball extended beyond lesion: Yes   Outcome: patient tolerated procedure well with no complications   Post-procedure details: wound care instructions given    fluorouracil (EFUDEX) 5 % cream - Face, ears Apply topically 2 (two) times daily. For 1 week to temples and ears  Skin cancer screening  Return in about 6 months (around 02/07/2022) for AK follow up.  I, Ashok Cordia, CMA, am acting as scribe for Sarina Ser, MD . Documentation: I have reviewed the above documentation for accuracy and  completeness, and I agree with the above.  Sarina Ser, MD

## 2021-08-07 NOTE — Patient Instructions (Signed)
Diabetes Mellitus and Nutrition, Adult When you have diabetes, or diabetes mellitus, it is very important to have healthy eating habits because your blood sugar (glucose) levels are greatly affected by what you eat and drink. Eating healthy foods in the right amounts, at about the same times every day, can help you:  Control your blood glucose.  Lower your risk of heart disease.  Improve your blood pressure.  Reach or maintain a healthy weight. What can affect my meal plan? Every person with diabetes is different, and each person has different needs for a meal plan. Your health care provider may recommend that you work with a dietitian to make a meal plan that is best for you. Your meal plan may vary depending on factors such as:  The calories you need.  The medicines you take.  Your weight.  Your blood glucose, blood pressure, and cholesterol levels.  Your activity level.  Other health conditions you have, such as heart or kidney disease. How do carbohydrates affect me? Carbohydrates, also called carbs, affect your blood glucose level more than any other type of food. Eating carbs naturally raises the amount of glucose in your blood. Carb counting is a method for keeping track of how many carbs you eat. Counting carbs is important to keep your blood glucose at a healthy level, especially if you use insulin or take certain oral diabetes medicines. It is important to know how many carbs you can safely have in each meal. This is different for every person. Your dietitian can help you calculate how many carbs you should have at each meal and for each snack. How does alcohol affect me? Alcohol can cause a sudden decrease in blood glucose (hypoglycemia), especially if you use insulin or take certain oral diabetes medicines. Hypoglycemia can be a life-threatening condition. Symptoms of hypoglycemia, such as sleepiness, dizziness, and confusion, are similar to symptoms of having too much  alcohol.  Do not drink alcohol if: ? Your health care provider tells you not to drink. ? You are pregnant, may be pregnant, or are planning to become pregnant.  If you drink alcohol: ? Do not drink on an empty stomach. ? Limit how much you use to:  0-1 drink a day for women.  0-2 drinks a day for men. ? Be aware of how much alcohol is in your drink. In the [place] one drink equals one 12 oz bottle of beer (355 mL), one 5 oz glass of wine (148 mL), or one 1 oz glass of hard liquor (44 mL). ? Keep yourself hydrated with water, diet soda, or unsweetened iced tea.  Keep in mind that regular soda, juice, and other mixers may contain a lot of sugar and must be counted as carbs. What are tips for following this plan? Reading food labels  Start by checking the serving size on the "Nutrition Facts" label of packaged foods and drinks. The amount of calories, carbs, fats, and other nutrients listed on the label is based on one serving of the item. Many items contain more than one serving per package.  Check the total grams (g) of carbs in one serving. You can calculate the number of servings of carbs in one serving by dividing the total carbs by 15. For example, if a food has 30 g of total carbs per serving, it would be equal to 2 servings of carbs.  Check the number of grams (g) of saturated fats and trans fats in one serving. Choose foods that have   a low amount or none of these fats.  Check the number of milligrams (mg) of salt (sodium) in one serving. Most people should limit total sodium intake to less than 2,300 mg per day.  Always check the nutrition information of foods labeled as "low-fat" or "nonfat." These foods may be higher in added sugar or refined carbs and should be avoided.  Talk to your dietitian to identify your daily goals for nutrients listed on the label. Shopping  Avoid buying canned, pre-made, or processed foods. These foods tend to be high in fat, sodium, and added  sugar.  Shop around the outside edge of the grocery store. This is where you will most often find fresh fruits and vegetables, bulk grains, fresh meats, and fresh dairy. Cooking  Use low-heat cooking methods, such as baking, instead of high-heat cooking methods like deep frying.  Cook using healthy oils, such as olive, canola, or sunflower oil.  Avoid cooking with butter, cream, or high-fat meats. Meal planning  Eat meals and snacks regularly, preferably at the same times every day. Avoid going long periods of time without eating.  Eat foods that are high in fiber, such as fresh fruits, vegetables, beans, and whole grains. Talk with your dietitian about how many servings of carbs you can eat at each meal.  Eat 4-6 oz (112-168 g) of lean protein each day, such as lean meat, chicken, fish, eggs, or tofu. One ounce (oz) of lean protein is equal to: ? 1 oz (28 g) of meat, chicken, or fish. ? 1 egg. ?  cup (62 g) of tofu.  Eat some foods each day that contain healthy fats, such as avocado, nuts, seeds, and fish.   What foods should I eat? Fruits Berries. Apples. Oranges. Peaches. Apricots. Plums. Grapes. Mango. Papaya. Pomegranate. Kiwi. Cherries. Vegetables Lettuce. Spinach. Leafy greens, including kale, chard, collard greens, and mustard greens. Beets. Cauliflower. Cabbage. Broccoli. Carrots. Green beans. Tomatoes. Peppers. Onions. Cucumbers. Brussels sprouts. Grains Whole grains, such as whole-wheat or whole-grain bread, crackers, tortillas, cereal, and pasta. Unsweetened oatmeal. Quinoa. Brown or wild rice. Meats and other proteins Seafood. Poultry without skin. Lean cuts of poultry and beef. Tofu. Nuts. Seeds. Dairy Low-fat or fat-free dairy products such as milk, yogurt, and cheese. The items listed above may not be a complete list of foods and beverages you can eat. Contact a dietitian for more information. What foods should I avoid? Fruits Fruits canned with  syrup. Vegetables Canned vegetables. Frozen vegetables with butter or cream sauce. Grains Refined white flour and flour products such as bread, pasta, snack foods, and cereals. Avoid all processed foods. Meats and other proteins Fatty cuts of meat. Poultry with skin. Breaded or fried meats. Processed meat. Avoid saturated fats. Dairy Full-fat yogurt, cheese, or milk. Beverages Sweetened drinks, such as soda or iced tea. The items listed above may not be a complete list of foods and beverages you should avoid. Contact a dietitian for more information. Questions to ask a health care provider  Do I need to meet with a diabetes educator?  Do I need to meet with a dietitian?  What number can I call if I have questions?  When are the best times to check my blood glucose? Where to find more information:  American Diabetes Association: diabetes.org  Academy of Nutrition and Dietetics: www.eatright.org  National Institute of Diabetes and Digestive and Kidney Diseases: www.niddk.nih.gov  Association of Diabetes Care and Education Specialists: www.diabeteseducator.org Summary  It is important to have healthy eating   habits because your blood sugar (glucose) levels are greatly affected by what you eat and drink.  A healthy meal plan will help you control your blood glucose and maintain a healthy lifestyle.  Your health care provider may recommend that you work with a dietitian to make a meal plan that is best for you.  Keep in mind that carbohydrates (carbs) and alcohol have immediate effects on your blood glucose levels. It is important to count carbs and to use alcohol carefully. This information is not intended to replace advice given to you by your health care provider. Make sure you discuss any questions you have with your health care provider. Document Revised: 11/10/2019 Document Reviewed: 11/10/2019 Elsevier Patient Education  2021 Elsevier Inc.  

## 2021-08-07 NOTE — Assessment & Plan Note (Signed)
Chronic, ongoing.  Continue current medication regimen and adjust as needed.  Lipid panel today, recent LDL 53.

## 2021-08-08 ENCOUNTER — Telehealth: Payer: Self-pay

## 2021-08-08 ENCOUNTER — Encounter: Payer: Self-pay | Admitting: Dermatology

## 2021-08-08 LAB — CBC WITH DIFFERENTIAL/PLATELET
Basophils Absolute: 0 10*3/uL (ref 0.0–0.2)
Basos: 0 %
EOS (ABSOLUTE): 0.3 10*3/uL (ref 0.0–0.4)
Eos: 4 %
Hematocrit: 42.7 % (ref 37.5–51.0)
Hemoglobin: 14.4 g/dL (ref 13.0–17.7)
Immature Grans (Abs): 0 10*3/uL (ref 0.0–0.1)
Immature Granulocytes: 1 %
Lymphocytes Absolute: 2.3 10*3/uL (ref 0.7–3.1)
Lymphs: 33 %
MCH: 29.2 pg (ref 26.6–33.0)
MCHC: 33.7 g/dL (ref 31.5–35.7)
MCV: 87 fL (ref 79–97)
Monocytes Absolute: 0.6 10*3/uL (ref 0.1–0.9)
Monocytes: 8 %
Neutrophils Absolute: 4 10*3/uL (ref 1.4–7.0)
Neutrophils: 54 %
Platelets: 160 10*3/uL (ref 150–450)
RBC: 4.93 x10E6/uL (ref 4.14–5.80)
RDW: 13.3 % (ref 11.6–15.4)
WBC: 7.2 10*3/uL (ref 3.4–10.8)

## 2021-08-08 LAB — MAGNESIUM: Magnesium: 1.8 mg/dL (ref 1.6–2.3)

## 2021-08-08 LAB — LIPID PANEL W/O CHOL/HDL RATIO
Cholesterol, Total: 144 mg/dL (ref 100–199)
HDL: 33 mg/dL — ABNORMAL LOW (ref >39)
LDL Chol Calc (NIH): 73 mg/dL (ref 0–99)
Triglycerides: 233 mg/dL — ABNORMAL HIGH (ref 0–149)
VLDL Cholesterol Cal: 38 mg/dL (ref 5–40)

## 2021-08-08 LAB — COMPREHENSIVE METABOLIC PANEL
ALT: 25 IU/L (ref 0–44)
AST: 27 IU/L (ref 0–40)
Albumin/Globulin Ratio: 2.3 — ABNORMAL HIGH (ref 1.2–2.2)
Albumin: 4.5 g/dL (ref 3.8–4.8)
Alkaline Phosphatase: 96 IU/L (ref 44–121)
BUN/Creatinine Ratio: 19 (ref 10–24)
BUN: 19 mg/dL (ref 8–27)
Bilirubin Total: 0.4 mg/dL (ref 0.0–1.2)
CO2: 26 mmol/L (ref 20–29)
Calcium: 9.6 mg/dL (ref 8.6–10.2)
Chloride: 101 mmol/L (ref 96–106)
Creatinine, Ser: 1.01 mg/dL (ref 0.76–1.27)
Globulin, Total: 2 g/dL (ref 1.5–4.5)
Glucose: 123 mg/dL — ABNORMAL HIGH (ref 65–99)
Potassium: 4.3 mmol/L (ref 3.5–5.2)
Sodium: 142 mmol/L (ref 134–144)
Total Protein: 6.5 g/dL (ref 6.0–8.5)
eGFR: 83 mL/min/{1.73_m2} (ref >59)

## 2021-08-08 LAB — VITAMIN B12: Vitamin B-12: 614 pg/mL (ref 232–1245)

## 2021-08-08 LAB — TSH: TSH: 2.05 u[IU]/mL (ref 0.450–4.500)

## 2021-08-08 LAB — PSA: Prostate Specific Ag, Serum: 0.2 ng/mL (ref 0.0–4.0)

## 2021-08-08 NOTE — Progress Notes (Signed)
Contacted via [place] afternoon [name], your labs have returned and overall they continue to be reassuring.  We do not need to make any changes.  If you have any questions let me know.   Keep being amazing!!  Thank you for allowing me to participate in your care.  I appreciate you. Kindest regards, Jarita Raval

## 2021-08-08 NOTE — Chronic Care Management (AMB) (Signed)
  Chronic Care Management   Note  08/08/2021 Name: Brian Frederick MRN: [id number] DOB: 1956-02-11  Brian Frederick is a 65 y.o. year old male who is a primary care patient of Cannady, Barbaraann Faster, NP. I reached out to Delight Stare by phone today in response to a referral sent by Brian Frederick's PCP, [name], NP      Brian Frederick was given information about Chronic Care Management services today including:  CCM service includes personalized support from designated clinical staff supervised by his physician, including individualized plan of care and coordination with other care providers 24/7 contact phone numbers for assistance for urgent and routine care needs. Service will only be billed when office clinical staff spend 20 minutes or more in a month to coordinate care. Only one practitioner may furnish and bill the service in a calendar month. The patient may stop CCM services at any time (effective at the end of the month) by phone call to the office staff. The patient will be responsible for cost sharing (co-pay) of up to 20% of the service fee (after annual deductible is met).  Patient agreed to services and verbal consent obtained.   Follow up plan: Telephone appointment with care management team member scheduled for:08/22/2021  Noreene Larsson, [place], [place], [place] [address] Direct Dial: [phone] [email or web address]

## 2021-08-18 ENCOUNTER — Telehealth: Payer: Self-pay

## 2021-08-18 NOTE — Chronic Care Management (AMB) (Signed)
Chronic Care Management Pharmacy Assistant   Name: Brian Frederick  MRN: [id number] DOB: 22-Nov-1956  Brian Frederick is an 64 y.o. year old male who presents for his initial CCM visit with the clinical pharmacist.  Recent office visits:  08/07/21-Jolene T. Cannady, NP (PCP) Seen for annual exam. Advised him to continue to reduce Levemir by 3 units every 3 days if BS fasting is <130. Labs ordered. AMB Referral to [facility] Coordination. Follow up in 3 months. 04/12/21-Jolene T. Ned Card, NP (PCP) General follow up.  Advised him to continue to reduce Levemir by 3 units every 3 days if BS fasting is <130. Labs ordered. Follow up in 3 months. Recent consult visits:  08/07/21-David Isac Caddy, MD (Dermatology) Seen for total body skin exam.  Recommend daily broad spectrum sunscreen SPF 30+ to sun-exposed areas, reapply every 2 hours as needed. start 5% fluorouracil cream/calcipotriene cream bid x 7 day. Follow up in 6 months. 04/06/21-Rebecca S. Tat, DO (Neurology) Seen for Parkinsons disease. Follow up in 6 months.  Hospital visits:  None in previous 6 months  Medications: Outpatient Encounter Medications as of 08/18/2021  Medication Sig Note   aspirin 81 MG tablet Take 81 mg by mouth daily.    atorvastatin (LIPITOR) 20 MG tablet Take 1 tablet (20 mg total) by mouth daily.    BD PEN NEEDLE NANO 2ND GEN 32G X 4 MM MISC USE 1 PEN NEEDLE EVERY MORNING    blood glucose meter kit and supplies KIT Dispense based on patient and insurance preference. Use up to four times daily as directed. (FOR ICD-9 250.00, 250.01).    carbidopa-levodopa (SINEMET IR) 25-100 MG tablet Take 1 tablet by mouth 3 (three) times daily.    Dulaglutide (TRULICITY) 1.5 WU/9.8JX SOPN ADMINISTER 1.5 MG UNDER THE SKIN 1 TIME A WEEK    escitalopram (LEXAPRO) 10 MG tablet Take 1 tablet (10 mg total) by mouth daily.    fluorouracil (EFUDEX) 5 % cream Apply topically 2 (two) times daily. For 1 week to temples and ears     gabapentin (NEURONTIN) 100 MG capsule TAKE 1 CAPSULE(100 MG) BY MOUTH TWICE DAILY    insulin detemir (LEVEMIR FLEXTOUCH) 100 UNIT/ML FlexPen ADMINISTER 16 UNITS UNDER THE SKIN AT BEDTIME    Lancets (ONETOUCH DELICA PLUS [id number]) MISC 1 each by Other route 2 (two) times daily.    lansoprazole (PREVACID) 30 MG capsule TAKE 1 CAPSULE BY MOUTH DAILY AT 12 NOON    lisinopril (ZESTRIL) 5 MG tablet TAKE 1 TABLET(5 MG) BY MOUTH DAILY    meloxicam (MOBIC) 15 MG tablet Take 1 tablet (15 mg total) by mouth daily. (Patient taking differently: Take 15 mg by mouth daily as needed for pain.) 05/13/2019: PRN   metFORMIN (GLUCOPHAGE-XR) 500 MG 24 hr tablet TAKE 2 TABLETS(1000 MG) BY MOUTH TWICE DAILY.    metoprolol succinate (TOPROL-XL) 100 MG 24 hr tablet TAKE 1 TABLET BY MOUTH EVERY DAY WITH FOOD OR IMMEDIATELY AFTER A MEAL    Multiple Vitamin (MULTIVITAMIN) tablet Take 1 tablet by mouth daily.    ONETOUCH VERIO test strip USE TO TEST BLOOD SUGAR UP TO FOUR TIMES DAILY AS DIRECTED    sildenafil (VIAGRA) 100 MG tablet Take 0.5-1 tablets (50-100 mg total) by mouth daily as needed for erectile dysfunction.    trihexyphenidyl (ARTANE) 2 MG tablet TAKE 1 TABLET(2 MG) BY MOUTH THREE TIMES DAILY WITH MEALS    No facility-administered encounter medications on file as of 08/18/2021.   Aspirin 81  MG tablet Last filled:04/27/20 0 DS Atorvastatin (LIPITOR) 20 MG tablet Last filled:04/27/21 90 DS Carbidopa-levodopa (SINEMET IR) 25-100 MG tablet Last filled:04/06/21 90 DS Dulaglutide (TRULICITY) 1.5 BX/4.3HW SOPN Last filled:07/20/21 28 DS Escitalopram (LEXAPRO) 10 MG tablet Last filled:07/14/21 90 DS Fluorouracil (EFUDEX) 5 % cream Last filled:Not listed Gabapentin (NEURONTIN) 100 MG capsule Last filled:06/20/21 90 DS Insulin detemir (LEVEMIR FLEXTOUCH) 100 UNIT/ML FlexPen Last filled:05/17/21 40 DS Lansoprazole (PREVACID) 30 MG capsule Last filled:05/16/21 90 DS Lisinopril (ZESTRIL) 5 MG tablet Last filled:06/13/21 90  DS Meloxicam (MOBIC) 15 MG tablet Last filled:04/27/20 0 DS MetFORMIN (GLUCOPHAGE-XR) 500 MG 24 hr tablet Last filled:07/20/21 90 DS Metoprolol succinate (TOPROL-XL) 100 MG 24 hr tablet Last filled:06/12/21 90 DS Sildenafil (VIAGRA) 100 MG tablet Last filled:10/07/20 5 DS Trihexyphenidyl (ARTANE) 2 MG tablet Last filled:07/14/21 90 DS  Have you seen any other providers since your last visit?    Any changes in your medications or health?    Any side effects from any medications?    Do you have an symptoms or problems not managed by your medications?    Any concerns about your health right now?    Has your provider asked that you check blood pressure, blood sugar, or follow special diet at home?    Do you get any type of exercise on a regular basis?    Can you think of a goal you would like to reach for your health?    Do you have any problems getting your medications?    Is there anything that you would like to discuss during the appointment?   Could not get in touch with patient. Left voicemail to return phone call.   Star Rating Drugs: Atorvastatin (LIPITOR) 20 MG tablet Last filled:04/27/21 90 DS Dulaglutide (TRULICITY) 1.5 YS/1.6OH SOPN Last filled:07/20/21 28 DS Lisinopril (ZESTRIL) 5 MG tablet Last filled:06/13/21 90 DS MetFORMIN (GLUCOPHAGE-XR) 500 MG 24 hr tablet Last filled:07/20/21 90 DS  Myriam Elta [place], [place]

## 2021-08-22 ENCOUNTER — Ambulatory Visit (INDEPENDENT_AMBULATORY_CARE_PROVIDER_SITE_OTHER): Payer: Medicare Other

## 2021-08-22 DIAGNOSIS — E785 Hyperlipidemia, unspecified: Secondary | ICD-10-CM

## 2021-08-22 DIAGNOSIS — E1169 Type 2 diabetes mellitus with other specified complication: Secondary | ICD-10-CM

## 2021-08-22 DIAGNOSIS — E669 Obesity, unspecified: Secondary | ICD-10-CM

## 2021-08-22 DIAGNOSIS — F419 Anxiety disorder, unspecified: Secondary | ICD-10-CM

## 2021-08-22 NOTE — Patient Instructions (Signed)
Brian Frederick,  Thank you for talking with me today. I have included our care plan/goals in the following pages.   Please review and call me at [phone] with any questions.  Thanks! Brian Frederick, PharmD, CPP Clinical Pharmacist Practitioner  [phone]  Care Plan : ccm pharmacy care plan  Updates made by [name], [facility] since 08/22/2021 12:00 AM     Problem: HLD HTN DMII CAG Morbid obesity   Priority: High     Long-Range Goal: disease management   Start Date: 08/22/2021  Priority: High  Note:    Current Barriers:  Unable to independently afford treatment regimen Unable to independently monitor therapeutic efficacy  Pharmacist Clinical Goal(s):  Patient will verbalize ability to afford treatment regimen through collaboration with PharmD and provider.   Interventions: 1:1 collaboration with [name], NP regarding development and update of comprehensive plan of care as evidenced by provider attestation and co-signature Inter-disciplinary care team collaboration (see longitudinal plan of care) Comprehensive medication review performed; medication list updated in electronic medical record  Hypertension (BP goal <130/80) -Controlled -Current treatment: Lisinopril 5 mg once daily  -Current home readings: checks on occasion, typically at goal  -Current dietary habits: low carb, still having some bread, potatoes but trying to be mindful of portion control -Current exercise habits: stays active on feet throughout the day  -Denies hypotensive/hypertensive symptoms -Educated on BP goals and benefits of medications for prevention of heart attack, stroke and kidney damage; Exercise goal of 150 minutes per week; -Counseled to monitor BP at home 1-2x/week, document, and provide log at future appointments -Recommended to continue current medication  Hyperlipidemia: (LDL goal < 100) -Controlled -Current treatment: Atorvastatin 20 mg once daily -Side effect  review- no problems noted -Educated on Cholesterol goals;  Benefits of statin for ASCVD risk reduction; -Recommended to continue current medication  Diabetes (A1c goal <7%) -Controlled -Current medications: Trulicity 1.5 mg once daily  Metformin 500 mg XR tablets - two tabs twice daily with meals  -Medications previously tried: Levemir -Current home glucose readings fasting glucose: 120s-140s -Denies hypoglycemic/hyperglycemic symptoms -Educated on A1c and blood sugar goals; Dietary modifications focused on TG elevation and reduced carb intake - handout provided  -Counseled to check feet daily and get yearly eye exams -Recommended to continue current medication Assessed patient finances. Would likely qualify for Patient Assistance Program through [employer] to send patient paperwork.  GI symptoms - reviewed strategies to minimize symptoms that might be related to metformin use - encouraged administration with meals - can try 2000 mg XL with dinner to minimize symptoms     The patient verbalized understanding of instructions provided today and agreed to receive a MyChart copy of patient instruction and/or educational materials. Telephone follow up appointment with pharmacy team member scheduled for: See next appointment with "Care Management Staff" under "What's Next" below.

## 2021-08-22 NOTE — Progress Notes (Signed)
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 year old male who is a primary patient of Cannady, Barbaraann Faster, NP.  The CCM team was consulted for assistance with disease management and care coordination needs.    Engaged with patient by telephone for initial visit in response to provider referral for pharmacy case management and/or care coordination services.   Consent to Services:  The patient was given the following information about Chronic Care Management services today, agreed to services, and gave verbal consent. See initial CCM appt for details.   Patient Care Team: [name], NP as PCP - General (Nurse Practitioner) Tat, Eustace Quail, DO as Consulting Physician (Neurology) [name], [facility] (Pharmacist)  Hospital visits: None in previous 6 months  Objective:  Lab Results  Component Value Date   CREATININE 1.01 08/07/2021   CREATININE 1.21 01/11/2021   CREATININE 1.09 06/10/2020    Lab Results  Component Value Date   HGBA1C 5.9 08/07/2021   HGBA1C 5.7 04/12/2021   HGBA1C 5.6 01/11/2021   Last diabetic Eye exam:  Lab Results  Component Value Date/Time   HMDIABEYEEXA No Retinopathy 04/27/2020 12:00 AM    Last diabetic Foot exam: No results found for: HMDIABFOOTEX      Component Value Date/Time   CHOL 144 08/07/2021 1442   CHOL 110 01/11/2021 0848   CHOL 164 06/10/2020 0808   CHOL 120 02/15/2020 0804   CHOL 126 11/18/2019 0817   CHOL 123 05/12/2019 0830   TRIG 233 (H) 08/07/2021 1442   TRIG 158 (H) 01/11/2021 0848   TRIG 201 (H) 06/10/2020 0808   TRIG 156 (H)  02/15/2020 0804   TRIG 174 (H) 11/18/2019 0817   TRIG 175 (H) 05/12/2019 0830   HDL 33 (L) 08/07/2021 1442   HDL 30 (L) 01/11/2021 0848   HDL 36 (L) 06/10/2020 0808   VLDL 31 (H) 02/15/2020 0804   LDLCALC 73 08/07/2021 1442   LDLCALC 53 01/11/2021 0848   LDLCALC 93 06/10/2020 0808    Hepatic Function Latest Ref Rng & Units 08/07/2021 06/10/2020 02/15/2020  Total Protein 6.0 - 8.5 g/dL 6.5 6.9 6.4  Albumin 3.8 - 4.8 g/dL 4.5 4.6 4.4  AST 0 - 40 IU/L 27 26 27   ALT 0 - 44 IU/L 25 30 35  Alk Phosphatase 44 - 121 IU/L 96 104 100  Total Bilirubin 0.0 - 1.2 mg/dL 0.4 0.3 0.3    Lab Results  Component Value Date/Time   TSH 2.050 08/07/2021 02:42 PM   TSH 2.480 06/10/2020 08:08 AM    CBC Latest Ref Rng & Units 08/07/2021 06/10/2020 05/02/2018  WBC 3.4 - 10.8 x10E3/uL 7.2 8.1 6.0  Hemoglobin 13.0 - 17.7 g/dL 14.4 14.5 14.3  Hematocrit 37.5 - 51.0 % 42.7 44.6 42.1  Platelets 150 - 450 x10E3/uL 160 169 179    Lab Results  Component Value Date/Time   VD25OH 35.7 09/28/2020 08:54 AM    Clinical ASCVD:  The 10-year ASCVD risk score Mikey Bussing DC Jr., et al., 2013) is: 21.5%   Values used to calculate the score:     Age: 55  years     Sex: Male     Is Non-Hispanic African American: No     Diabetic: Yes     Tobacco smoker: No     Systolic Blood Pressure: 850 mmHg     Is BP treated: Yes     HDL Cholesterol: 33 mg/dL     Total Cholesterol: 144 mg/dL   Social History   Tobacco Use  Smoking Status Never  Smokeless Tobacco Never   BP Readings from Last 3 Encounters:  08/07/21 111/74  04/12/21 107/72  04/06/21 130/88   Pulse Readings from Last 3 Encounters:  08/07/21 73  04/12/21 70  04/06/21 71   Wt Readings from Last 3 Encounters:  08/07/21 252 lb 12.8 oz (114.7 kg)  04/12/21 253 lb 12.8 oz (115.1 kg)  04/06/21 255 lb (115.7 kg)   BMI Readings from Last 3 Encounters:  08/07/21 35.76 kg/m  04/12/21 35.40 kg/m  04/06/21 35.57 kg/m    Assessment: Review of patient past  medical history, allergies, medications, health status, including review of consultants reports, laboratory and other test data, was performed as part of comprehensive evaluation and provision of chronic care management services.   SDOH:  (Social Determinants of Health) assessments and interventions performed: Yes   CCM Care Plan  Allergies  Allergen Reactions   Amoxil [Amoxicillin] Swelling    Did it involve swelling of the face/tongue/throat, SOB, or low BP? No Did it involve sudden or severe rash/hives, skin peeling, or any reaction on the inside of your mouth or nose? No Did you need to seek medical attention at a hospital or doctor's office? No When did it last happen?      15 years ago If all above answers are "NO", may proceed with cephalosporin use.    Penicillin G Benzathine Swelling    Did it involve swelling of the face/tongue/throat, SOB, or low BP? No Did it involve sudden or severe rash/hives, skin peeling, or any reaction on the inside of your mouth or nose? No Did you need to seek medical attention at a hospital or doctor's office? No When did it last happen?      15 years ago If all above answers are "NO", may proceed with cephalosporin use.     Medications Reviewed Today     Reviewed by [name], [facility] (Pharmacist) on 08/22/21 at [place] List Status: <None>   Medication Order Taking? Sig Documenting Provider Last Dose Status Informant  aspirin 81 MG tablet [id number] Yes Take 81 mg by mouth daily. [provider] Taking Active Self  atorvastatin (LIPITOR) 20 MG tablet [id number] Yes Take 1 tablet (20 mg total) by mouth daily. [name], NP Taking Active   BD PEN NEEDLE NANO 2ND GEN 32G X 4 MM MISC [id number]  USE 1 PEN NEEDLE EVERY MORNING Cannady, Jolene T, NP  Active   blood glucose meter kit and supplies KIT [id number]  Dispense based on patient and insurance preference. Use up to four times daily as directed. (FOR ICD-9 250.00, 250.01). [name] T, NP  Active Self  carbidopa-levodopa (SINEMET IR) 25-100 MG tablet [id number] Yes Take 1 tablet by mouth 3 (three) times daily. Ludwig Clarks, DO Taking Active   Dulaglutide (TRULICITY) 1.5 XN/1.7GY SOPN [id number] Yes ADMINISTER 1.5 MG UNDER THE SKIN 1 TIME A WEEK Cannady, Jolene T, NP Taking Active   escitalopram (LEXAPRO) 10 MG tablet [id number] Yes Take 1 tablet (10 mg total) by mouth daily. [name], NP  Taking Active   fluorouracil (EFUDEX) 5 % cream [id number]  Apply topically 2 (two) times daily. For 1 week to temples and ears Ralene Bathe, MD  Active   gabapentin (NEURONTIN) 100 MG capsule [id number] Yes TAKE 1 CAPSULE(100 MG) BY MOUTH TWICE DAILY Cannady, Jolene T, NP Taking Active   insulin detemir (LEVEMIR FLEXTOUCH) 100 UNIT/ML FlexPen [id number] No ADMINISTER 16 UNITS UNDER THE SKIN AT BEDTIME  Patient not taking: Reported on 08/22/2021   [name], NP Not Taking Active   Lancets (ONETOUCH DELICA PLUS [id number]) [place] [id number]  1 each by Other route 2 (two) times daily. [name] T, NP  Active Self  lansoprazole (PREVACID) 30 MG capsule [id number]  TAKE 1 CAPSULE BY MOUTH DAILY AT 12 NOON Cannady, Jolene T, NP  Active   lisinopril (ZESTRIL) 5 MG tablet [id number]  TAKE 1 TABLET(5 MG) BY MOUTH DAILY Cannady, Jolene T, NP  Active   meloxicam (MOBIC) 15 MG tablet [id number]  Take 1 tablet (15 mg total) by mouth daily.  Patient taking differently: Take 15 mg by mouth daily as needed for pain.   Kathrine Haddock, NP  Active            Med Note Darnelle Maffucci, Arville Lime   Wed May 13, 2019  3:07 PM) PRN  metFORMIN (GLUCOPHAGE-XR) 500 MG 24 hr tablet [id number]  TAKE 2 TABLETS(1000 MG) BY MOUTH TWICE DAILY. [name] T, NP  Active   metoprolol succinate (TOPROL-XL) 100 MG 24 hr tablet [id number] Yes TAKE 1 TABLET BY MOUTH EVERY DAY WITH FOOD OR IMMEDIATELY AFTER A MEAL Cannady, Jolene T, NP Taking Active   Multiple Vitamin (MULTIVITAMIN) tablet [id number]  Take 1 tablet  by mouth daily. [provider]  Active Self  ONETOUCH VERIO test strip [id number]  USE TO TEST BLOOD SUGAR UP TO FOUR TIMES DAILY AS DIRECTED Cannady, Jolene T, NP  Active   sildenafil (VIAGRA) 100 MG tablet [id number]  Take 0.5-1 tablets (50-100 mg total) by mouth daily as needed for erectile dysfunction. [name] T, NP  Active   trihexyphenidyl (ARTANE) 2 MG tablet [id number]  TAKE 1 TABLET(2 MG) BY MOUTH THREE TIMES DAILY WITH MEALS Tat, Eustace Quail, DO  Active             Patient Active Problem List   Diagnosis Date Noted   Vitamin B12 deficiency 09/29/2020   History of cardiac arrhythmia 06/03/2019   Primary insomnia 05/05/2019   Type 2 diabetes mellitus with obesity ([place]) 09/05/2018   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[place]) 01/77/9390   Chronic anxiety 05/02/2018   History of shoulder surgery 05/02/2018   Parkinson's disease ([place]) 01/16/2018   Benign neoplasm of descending colon    ED (erectile dysfunction) 03/27/2017   Hypogonadism in male 09/22/2015   Basal cell carcinoma 09/22/2015   Osteoarthritis of knee 09/22/2015   Benign prostatic hyperplasia 09/22/2015   Morbid obesity ([place]) 09/22/2015   Hyperlipidemia associated with type 2 diabetes mellitus ([place]) 09/22/2015   History of open heart surgery 10/06/2014   GERD (gastroesophageal reflux disease) 10/22/2013    Immunization History  Administered Date(s) Administered   Influenza,inj,Quad PF,6+ Mos 09/23/2015, 09/25/2016, 09/29/2017, 09/28/2018, 09/21/2019, 09/28/2020   Influenza-Unspecified 09/29/2017, 09/28/2018   PFIZER(Purple Top)SARS-COV-2 Vaccination 03/16/2020, 04/06/2020, 10/21/2020   Pneumococcal Conjugate-13 01/11/2021   Pneumococcal Polysaccharide-23 08/26/2013   Td 08/19/2002   Tdap 11/05/2012   Zoster Recombinat (Shingrix) 09/29/2017, 12/08/2017   Zoster, Live 03/23/2016    Conditions to be addressed/monitored: HLD  HTN DMII Morbid obesity   Care Plan : ccm pharmacy care  plan  Updates made by [name], [facility] since 08/22/2021 12:00 AM     Problem: HLD HTN DMII CAG Morbid obesity   Priority: High     Long-Range Goal: disease management   Start Date: 08/22/2021  Priority: High  Note:    Current Barriers:  Unable to independently afford treatment regimen Unable to independently monitor therapeutic efficacy  Pharmacist Clinical Goal(s):  Patient will verbalize ability to afford treatment regimen through collaboration with PharmD and provider.   Interventions: 1:1 collaboration with [name], NP regarding development and update of comprehensive plan of care as evidenced by provider attestation and co-signature Inter-disciplinary care team collaboration (see longitudinal plan of care) Comprehensive medication review performed; medication list updated in electronic medical record  Hypertension (BP goal <130/80) -Controlled -Current treatment: Lisinopril 5 mg once daily  -Current home readings: checks on occasion, typically at goal  -Current dietary habits: low carb, still having some bread, potatoes but trying to be mindful of portion control -Current exercise habits: stays active on feet throughout the day  -Denies hypotensive/hypertensive symptoms -Educated on BP goals and benefits of medications for prevention of heart attack, stroke and kidney damage; Exercise goal of 150 minutes per week; -Counseled to monitor BP at home 1-2x/week, document, and provide log at future appointments -Recommended to continue current medication  Hyperlipidemia: (LDL goal < 100) -Controlled -Current treatment: Atorvastatin 20 mg once daily -Side effect review- no problems noted -Educated on Cholesterol goals;  Benefits of statin for ASCVD risk reduction; -Recommended to continue current medication  Diabetes (A1c goal <7%) -Controlled -Current medications: Trulicity 1.5 mg once daily  Metformin 500 mg XR tablets - two tabs twice daily with meals   -Medications previously tried: Levemir -Current home glucose readings fasting glucose: 120s-140s -Denies hypoglycemic/hyperglycemic symptoms -Educated on A1c and blood sugar goals; Dietary modifications focused on TG elevation and reduced carb intake - handout provided  -Counseled to check feet daily and get yearly eye exams -Recommended to continue current medication Assessed patient finances. Would likely qualify for Patient Assistance Program through [place] to send patient paperwork.  GI symptoms - reviewed strategies to minimize symptoms that might be related to metformin use - encouraged administration with meals - can try 2000 mg XL with dinner to minimize symptoms     Patient Goals/Self-Care Activities Patient will:  - take medications as prescribed check glucose daily, document, and provide at future appointments collaborate with provider on medication access solutions  Medication Assistance: Application for Trulicity  medication assistance program. in process.  Anticipated assistance start date Sept-Oct 2022.  See plan of care for additional detail.  Patient's preferred pharmacy is:  [id number] [place], [place] - [place] AT [facility] [address] [place] [address] Phone: [phone] Fax: [phone]  [place], [address] [place] [address] Phone: [phone] Fax: [phone]  Pt endorses 100% compliance  Follow Up:  Patient agrees to Care Plan and Follow-up. Plan: HC trulicity PAP + 54/0086 DM call/PAP f/u. CPP 6 month telephone visit   Future Appointments  Date Time Provider [place]  10/10/2021 10:45 AM Tat, Eustace Quail, DO [facility] None  11/07/2021 10:20 AM [name] T, NP [facility]FP[facility] P[facility]  02/08/2022 10:00 AM Ralene Bathe, MD ASC-[facility] None  02/19/2022  1:00 PM CFP CCM PHARMACY [facility] PEC  [name], PharmD, CPP Clinical Pharmacist  Practitioner  [place]  [phone]

## 2021-08-25 DIAGNOSIS — H43813 Vitreous degeneration, bilateral: Secondary | ICD-10-CM | POA: Diagnosis not present

## 2021-08-25 DIAGNOSIS — E119 Type 2 diabetes mellitus without complications: Secondary | ICD-10-CM | POA: Diagnosis not present

## 2021-08-25 LAB — HM DIABETES EYE EXAM

## 2021-08-31 ENCOUNTER — Telehealth: Payer: Self-pay

## 2021-08-31 NOTE — Telephone Encounter (Signed)
Pt came in and dropped off an envelope for [place] placed in your folder.

## 2021-09-15 DIAGNOSIS — E785 Hyperlipidemia, unspecified: Secondary | ICD-10-CM | POA: Diagnosis not present

## 2021-09-15 DIAGNOSIS — E669 Obesity, unspecified: Secondary | ICD-10-CM

## 2021-09-15 DIAGNOSIS — E1169 Type 2 diabetes mellitus with other specified complication: Secondary | ICD-10-CM

## 2021-10-02 ENCOUNTER — Telehealth: Payer: Self-pay

## 2021-10-02 NOTE — Progress Notes (Signed)
See encounter for 10/02/21 for update

## 2021-10-02 NOTE — Chronic Care Management (AMB) (Signed)
Chronic Care Management Pharmacy Assistant   Name: Brian Frederick  MRN: [id number] DOB: December 27, 1955   Reason for Encounter: Disease State Diabetes     Recent office visits:  None noted   Recent consult visits:  None noted   Hospital visits:  None in previous 6 months  Medications: Outpatient Encounter Medications as of 10/02/2021  Medication Sig Note   aspirin 81 MG tablet Take 81 mg by mouth daily.    atorvastatin (LIPITOR) 20 MG tablet Take 1 tablet (20 mg total) by mouth daily.    BD PEN NEEDLE NANO 2ND GEN 32G X 4 MM MISC USE 1 PEN NEEDLE EVERY MORNING    blood glucose meter kit and supplies KIT Dispense based on patient and insurance preference. Use up to four times daily as directed. (FOR ICD-9 250.00, 250.01).    carbidopa-levodopa (SINEMET IR) 25-100 MG tablet Take 1 tablet by mouth 3 (three) times daily.    Dulaglutide (TRULICITY) 1.5 MG/0.5ML SOPN ADMINISTER 1.5 MG UNDER THE SKIN 1 TIME A WEEK    escitalopram (LEXAPRO) 10 MG tablet Take 1 tablet (10 mg total) by mouth daily.    fluorouracil (EFUDEX) 5 % cream Apply topically 2 (two) times daily. For 1 week to temples and ears    gabapentin (NEURONTIN) 100 MG capsule TAKE 1 CAPSULE(100 MG) BY MOUTH TWICE DAILY    insulin detemir (LEVEMIR FLEXTOUCH) 100 UNIT/ML FlexPen ADMINISTER 16 UNITS UNDER THE SKIN AT BEDTIME (Patient not taking: Reported on 08/22/2021)    Lancets (ONETOUCH DELICA PLUS LANCET33G) MISC 1 each by Other route 2 (two) times daily.    lansoprazole (PREVACID) 30 MG capsule TAKE 1 CAPSULE BY MOUTH DAILY AT 12 NOON    lisinopril (ZESTRIL) 5 MG tablet TAKE 1 TABLET(5 MG) BY MOUTH DAILY    meloxicam (MOBIC) 15 MG tablet Take 1 tablet (15 mg total) by mouth daily. (Patient taking differently: Take 15 mg by mouth daily as needed for pain.) 05/13/2019: PRN   metFORMIN (GLUCOPHAGE-XR) 500 MG 24 hr tablet TAKE 2 TABLETS(1000 MG) BY MOUTH TWICE DAILY.    metoprolol succinate (TOPROL-XL) 100 MG 24 hr tablet TAKE 1  TABLET BY MOUTH EVERY DAY WITH FOOD OR IMMEDIATELY AFTER A MEAL    Multiple Vitamin (MULTIVITAMIN) tablet Take 1 tablet by mouth daily.    ONETOUCH VERIO test strip USE TO TEST BLOOD SUGAR UP TO FOUR TIMES DAILY AS DIRECTED    sildenafil (VIAGRA) 100 MG tablet Take 0.5-1 tablets (50-100 mg total) by mouth daily as needed for erectile dysfunction.    trihexyphenidyl (ARTANE) 2 MG tablet TAKE 1 TABLET(2 MG) BY MOUTH THREE TIMES DAILY WITH MEALS    No facility-administered encounter medications on file as of 10/02/2021.   Recent Relevant Labs: Lab Results  Component Value Date/Time   HGBA1C 5.9 08/07/2021 02:23 PM   HGBA1C 5.7 04/12/2021 08:50 AM   MICROALBUR 30 (H) 01/11/2021 08:41 AM   MICROALBUR 30 (H) 02/15/2020 08:04 AM    Kidney Function Lab Results  Component Value Date/Time   CREATININE 1.01 08/07/2021 02:42 PM   CREATININE 1.21 01/11/2021 08:48 AM   CREATININE 1.52 (H) 09/02/2013 02:20 AM   CREATININE 1.45 (H) 07/27/2013 12:09 PM   GFRNONAA 62 01/11/2021 08:48 AM   GFRNONAA 50 (L) 09/02/2013 02:20 AM   GFRAA 72 01/11/2021 08:48 AM   GFRAA 58 (L) 09/02/2013 02:20 AM    Current antihyperglycemic regimen:  Trulicity 1.5 mg once daily  Metformin 500 mg XR tablets - two  tabs twice daily with meals  What recent interventions/DTPs have been made to improve glycemic control:  N/a Have there been any recent hospitalizations or ED visits since last visit with CPP? No Patient denies hypoglycemic symptoms, including Pale, Sweaty, Shaky, Hungry, Nervous/irritable, and Vision changes Patient denies hyperglycemic symptoms, including blurry vision, excessive thirst, fatigue, polyuria, and weakness How often are you checking your blood sugar? once daily in the morning What are your blood sugars ranging?  Fasting: 10/02/21 _0  10/01/21 _1   During the week, how often does your blood glucose drop below 70? Never Are you checking your feet daily/regularly? N/a  Adherence Review: Is  the patient currently on a STATIN medication? Yes Is the patient currently on ACE/ARB medication? Yes Does the patient have >5 day gap between last estimated fill dates? Yes  Misc comment: Patient states that his blood sugars have been doing well and typically ranges around 100. I informed patient that Samuella Bruin needs proof of income in order to proceed with the patient assistance for trulicity. Patient stated that he gathered the necessary information and attempted to fax it himself but was unsuccessful. I informed Mr Rosenbloom that he can drop off the papers at [facility] family practice and someone in the office can fax it from there. Patient states understanding. Overall, patient is doing well and has no other concerns at this time.   Care Gaps: None  Star Rating Drugs: atorvastatin (LIPITOR) 20 MG tablet last fill 04/27/2021 90 DS Dulaglutide (TRULICITY) 1.5 EE/0.5IV SOPN last fill 09/19/2021 90 DS insulin detemir (LEVEMIR FLEXTOUCH) 100 UNIT/ML FlexPen last fill 05/17/2021 40 DS lisinopril (ZESTRIL) 5 MG tablet last fill 09/05/2021 DS metFORMIN (GLUCOPHAGE-XR) 500 MG 24 hr tablet last fill 07/20/2021  DS    Andee Poles, CMA

## 2021-10-06 NOTE — Progress Notes (Signed)
Assessment/Plan:   1.  Parkinsons Disease, diagnosed January, 2019.  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  -Increase carbidopa/levodopa 25/100, 2 at 8am/2 at noon/1 at 4pm  -For now he will continue trihexyphenidyl, 2 mg 3 times per day.  He has no side effects with it.  -He follows with dermatology regularly.  Last seen in August, 2022  -not ready for dbs - discussed today   2.  Diabetes mellitus  -well controlled  -weight stable     Subjective:   Brian Frederick was seen today in follow up for Parkinsons disease.  My previous records were reviewed prior to todays visit as well as outside records available to me.  He last saw dermatology in February and again in August.  Saw his primary care nurse practitioner not long after the April and then again in August.  Pt denies falls.  Pt denies lightheadedness, near syncope.  No hallucinations.  Mood has been good.   February.Current prescribed movement disorder medications: Carbidopa/levodopa 25/100, 1 tablet 3 times per day Trihexyphenidyl, 2 mg 3 times per day   PREVIOUS MEDICATIONS: Pramipexole caused excessive daytime hypersomnolence (fell asleep while driving and may have caused compulsive eating)  ALLERGIES:   Allergies  Allergen Reactions   Amoxil [Amoxicillin] Swelling    Did it involve swelling of the face/tongue/throat, SOB, or low BP? No Did it involve sudden or severe rash/hives, skin peeling, or any reaction on the inside of your mouth or nose? No Did you need to seek medical attention at a hospital or doctor's office? No When did it last happen?      15 years ago If all above answers are "NO", may proceed with cephalosporin use.    Penicillin G Benzathine Swelling    Did it involve swelling of the face/tongue/throat, SOB, or low BP? No Did it involve sudden or severe rash/hives, skin peeling, or any reaction on the inside of your mouth or nose? No Did you need to seek medical attention at a hospital or  doctor's office? No When did it last happen?      15 years ago If all above answers are "NO", may proceed with cephalosporin use.     CURRENT MEDICATIONS:  Outpatient Encounter Medications as of 10/10/2021  Medication Sig   aspirin 81 MG tablet Take 81 mg by mouth daily.   atorvastatin (LIPITOR) 20 MG tablet Take 1 tablet (20 mg total) by mouth daily.   BD PEN NEEDLE NANO 2ND GEN 32G X 4 MM MISC USE 1 PEN NEEDLE EVERY MORNING   blood glucose meter kit and supplies KIT Dispense based on patient and insurance preference. Use up to four times daily as directed. (FOR ICD-9 250.00, 250.01).   carbidopa-levodopa (SINEMET IR) 25-100 MG tablet Take 1 tablet by mouth 3 (three) times daily.   Dulaglutide (TRULICITY) 1.5 BL/3.9QZ SOPN ADMINISTER 1.5 MG UNDER THE SKIN 1 TIME A WEEK   escitalopram (LEXAPRO) 10 MG tablet Take 1 tablet (10 mg total) by mouth daily.   gabapentin (NEURONTIN) 100 MG capsule TAKE 1 CAPSULE(100 MG) BY MOUTH TWICE DAILY   insulin detemir (LEVEMIR FLEXTOUCH) 100 UNIT/ML FlexPen ADMINISTER 16 UNITS UNDER THE SKIN AT BEDTIME   lansoprazole (PREVACID) 30 MG capsule TAKE 1 CAPSULE BY MOUTH DAILY AT 12 NOON   lisinopril (ZESTRIL) 5 MG tablet TAKE 1 TABLET(5 MG) BY MOUTH DAILY   meloxicam (MOBIC) 15 MG tablet Take 1 tablet (15 mg total) by mouth daily. (Patient taking differently: Take 15  mg by mouth daily as needed for pain.)   metFORMIN (GLUCOPHAGE-XR) 500 MG 24 hr tablet TAKE 2 TABLETS(1000 MG) BY MOUTH TWICE DAILY.   metoprolol succinate (TOPROL-XL) 100 MG 24 hr tablet TAKE 1 TABLET BY MOUTH EVERY DAY WITH FOOD OR IMMEDIATELY AFTER A MEAL   Multiple Vitamin (MULTIVITAMIN) tablet Take 1 tablet by mouth daily.   ONETOUCH VERIO test strip USE TO TEST BLOOD SUGAR UP TO FOUR TIMES DAILY AS DIRECTED   sildenafil (VIAGRA) 100 MG tablet Take 0.5-1 tablets (50-100 mg total) by mouth daily as needed for erectile dysfunction.   trihexyphenidyl (ARTANE) 2 MG tablet TAKE 1 TABLET(2 MG) BY  MOUTH THREE TIMES DAILY WITH MEALS   fluorouracil (EFUDEX) 5 % cream Apply topically 2 (two) times daily. For 1 week to temples and ears   [DISCONTINUED] Lancets (ONETOUCH DELICA PLUS ASTMHD62I) MISC 1 each by Other route 2 (two) times daily. (Patient not taking: Reported on 10/10/2021)   No facility-administered encounter medications on file as of 10/10/2021.    Objective:   PHYSICAL EXAMINATION:    VITALS:   Vitals:   10/10/21 1048  BP: 135/81  Pulse: 79  SpO2: 99%  Weight: 255 lb (115.7 kg)  Height: $Remove'5\' 11"'SbONaSP$  (1.803 m)     Wt Readings from Last 3 Encounters:  10/10/21 255 lb (115.7 kg)  08/07/21 252 lb 12.8 oz (114.7 kg)  04/12/21 253 lb 12.8 oz (115.1 kg)     GEN:  The patient appears stated age and is in NAD. HEENT:  Normocephalic, atraumatic.  The mucous membranes are moist. The superficial temporal arteries are without ropiness or tenderness. CV:  RRR Lungs:  CTAB Neck/HEME:  There are no carotid bruits bilaterally.  Neurological examination:  Orientation: The patient is alert and oriented x3. Cranial nerves: There is good facial symmetry without facial hypomimia. The speech is fluent and clear. Soft palate rises symmetrically and there is no tongue deviation. Hearing is intact to conversational tone. Sensation: Sensation is intact to light touch throughout Motor: Strength is at least antigravity x4.  Movement examination: Tone: There is mod increased tone in the left upper extremity Abnormal movements: There is left upper extremity rest tremor Coordination:  There is mild decremation with RAM's, with any form of RAMS, including alternating supination and pronation of the forearm, hand opening and closing, finger taps, heel taps and toe taps, L Gait and Station: The patient has no difficulty arising out of a deep-seated chair without the use of the hands. The patient's stride length is slightly decreased but he drags the L leg  I have reviewed and interpreted the  following labs independently    Chemistry      Component Value Date/Time   NA 142 08/07/2021 1442   NA 142 09/02/2013 0220   K 4.3 08/07/2021 1442   K 4.2 09/02/2013 0220   CL 101 08/07/2021 1442   CL 107 09/02/2013 0220   CO2 26 08/07/2021 1442   CO2 25 09/02/2013 0220   BUN 19 08/07/2021 1442   BUN 23 (H) 09/02/2013 0220   CREATININE 1.01 08/07/2021 1442   CREATININE 1.52 (H) 09/02/2013 0220      Component Value Date/Time   CALCIUM 9.6 08/07/2021 1442   CALCIUM 9.4 09/02/2013 0220   ALKPHOS 96 08/07/2021 1442   ALKPHOS 107 09/02/2013 0220   AST 27 08/07/2021 1442   AST 30 09/02/2013 0220   ALT 25 08/07/2021 1442   ALT 38 09/02/2013 0220   BILITOT 0.4 08/07/2021  1442   BILITOT 0.3 09/02/2013 0220       Lab Results  Component Value Date   WBC 7.2 08/07/2021   HGB 14.4 08/07/2021   HCT 42.7 08/07/2021   MCV 87 08/07/2021   PLT 160 08/07/2021    Lab Results  Component Value Date   TSH 2.050 08/07/2021   Lab Results  Component Value Date   HGBA1C 5.9 08/07/2021      Cc:  [name], NP

## 2021-10-09 NOTE — Telephone Encounter (Signed)
Proof of income received and faxed to [facility]

## 2021-10-10 ENCOUNTER — Ambulatory Visit: Payer: Medicare Other | Admitting: Neurology

## 2021-10-10 ENCOUNTER — Encounter: Payer: Self-pay | Admitting: Neurology

## 2021-10-10 ENCOUNTER — Other Ambulatory Visit: Payer: Self-pay

## 2021-10-10 VITALS — BP 135/81 | HR 79 | Ht 71.0 in | Wt 255.0 lb

## 2021-10-10 DIAGNOSIS — E1169 Type 2 diabetes mellitus with other specified complication: Secondary | ICD-10-CM

## 2021-10-10 DIAGNOSIS — G20 Parkinson's disease: Secondary | ICD-10-CM | POA: Diagnosis not present

## 2021-10-10 DIAGNOSIS — E669 Obesity, unspecified: Secondary | ICD-10-CM | POA: Diagnosis not present

## 2021-10-10 MED ORDER — TRIHEXYPHENIDYL HCL 2 MG PO TABS: 2.0000 mg | ORAL_TABLET | Freq: Three times a day (TID) | ORAL | 1 refills | Status: DC

## 2021-10-10 MED ORDER — CARBIDOPA-LEVODOPA 25-100 MG PO TABS: ORAL_TABLET | ORAL | 1 refills | Status: DC

## 2021-10-10 NOTE — Patient Instructions (Addendum)
Increase carbidopa/levodopa 25/100,  2 at 8am/2 at noon/1 at 4pm Continue trihexyphenidyl as previous  Oncologist for [employer] Parkinson[facility] Group October 2022  Local [place] Online Groups  [employer] Group :   [employer] Parkinson's Patient Education Group will be Wednesday, October 12th-*Hybrid meting*- in person at [facility] location and via [facility] at 2:00 pm.   Upcoming [employer] Meetings:  2nd Wednesdays of the month at 2 pm:  October 12th, November 9th Contact Amy Marriott at [email or web address] if interested in participating in this online group Parkinson's Care Partners Group:    3rd Mondays, Contact Misty Paladino Atypical Parkinsonian Patient Group:   4th Wednesdays, [place] If you are interested in participating in these online groups with Misty, please contact her directly for how to join those meetings.  Her contact information is [email or web address].   [place]:  [email or web address] PD Health at Home continues:  Mindfulness Mondays, Expert Briefing Tuesdays, Wellness Wednesdays, Take Time Thursdays, Fitness Fridays -Listings for June 2022 are on the website Upcoming Webinar:  Expert Briefing:  Let's Talk about Dementia.  Wednesday, November 2nd  at 1 pm. Upcoming Webinar:  Understanding Gene and Cell-Based Therapies in Parkinson's.  Wednesday, October 5th at 1 pm Register for expert briefings (webinars) at [email or web address]  Please check out their website to sign up for emails and see their full online offerings  [place]:  [email or web address]  Upcoming Webinar:   Deep Brain Stimulation:  Is it Right for me or my Loved One?  Thursday, October 20th at 12 noon Check out additional information on their website to see their full online offerings  [place]:   [email or web address] Upcoming Webinar:  Living With and Managing Parkinson's Disease Psychosis.  Tuesday, October 18th at 3 pm.  Live Q & A:  Parkinson's Disease Psychosis.  Friday, October 28th at 3 pm. Care Partner Monthly Meetup.  With Robin Searing Phinney.  First Tuesday of each month, 2 pm Joy Breaks:  First Wednesday of each month, 2-3 pm. There will be art, doodling, making, crafting, listening, laughing, stories, and everything in between. No art experience necessary. No supplies required. Just show up for joy!  Register on their website. Check out additional information to Live Well Today on their website  Parkinson and Movement Disorders (PMD) Alliance:  www.[email or web address] [employer] Online:  Online Education Events Sign up for emails, which are sent weekly to give you updates on programming and online offerings  Parkinson's Association of the [place]:  www.[email or web address] Information on online support groups, education events, and online exercises including Yoga, Parkinson's exercises and more-LOTS of information on links to PD resources and online events Virtual Support Group through Parkinson's Association of the [place]; next one is scheduled for Wednesday, October 5th at 2 pm.  (These are typically scheduled for the 1st Wednesday of the month at 2 pm).  Visit website for details.  Additional links for movement activities: Parkinson's DRUMMING Classes/Music Therapy with Doylene Canning:  This is a returning class and it's FREE!  2nd Mondays, continuing October 10th.  Contact *Misty Taylor-Paladino at [email or web address] or Doylene Canning at [phone] or [email or web address]  PWR! Moves Classes at [place] RESUMED!  Wednesdays 10 and 11 am.  Contact Amy Marriott, PT [email or web address] if interested Here is a link to the PWR!Moves classes on Zoom from [place] - Daily Mon-Sat at  10:00. Via Zoom, FREE and open to all.  There is also a link below via  Facebook if you use that platform. [email or web address] Parkinson's Wellness Recovery (PWR! Moves)  www.[email or web address] Info on the PWR! Virtual Experience:  You will have access to our expertise through self-assessment, guided plans that start with the PD-specific fundamentals, educational content, tips, Q&A with an expert, and a growing Art therapist of PD-specific pre-recorded and live exercise classes of varying types and intensity - both physical and cognitive! If that is not enough, we offer 1:1 wellness consultations (in-person or virtual) to personalize your PWR! Research scientist (medical).  [place] Fridays:  As part of the PD Health @ Home program, this free video series focuses each week on one aspect of fitness designed to support people living with Parkinson's.  These weekly videos highlight the [place] recent fitness guidelines for people with Parkinson's disease.  HollywoodSale.dk Dance for PD website is offering free, live-stream classes throughout the week, as well as links to [employer] of classes:  [email or web address]/ Dance for Parkinson's Class:  [place].  Free offering for people with Parkinson's and care partners; virtual class.  For more information, contact [phone] or email Ruffin Frederick at [email or web address] Virtual dance and Pilates for Parkinson's classes: Click on the Community Tab> Parkinson's Movement Initiative Tab.  To register for classes and for more information, visit [email or web address] and click the "community" tab.  [employer] Parkinson's Cycling Classes   [employer]: 1pm on Fridays-Live classes at [employer] ([employer] at [email or web address] or [phone]) [employer]: Virtual Classes Mondays and Thursdays Jeanette Caprice classes Tuesday, Wednesday and Thursday (contact [place] at [email or web address]  or [phone])  [place] Varied levels of classes are offered Mondays, Tuesdays and Thursdays at [employer].  To observe a class or for more information, call [phone] or email [email or web address] [employer]: Online Caregiver Education Opportunities:  www.well-[email or web address]/caregiver-education/caregiver-support-group.  You may also contact Vickki Muff at [email or web address] or [phone].   Powerful Tools for Caregivers:  6-week program beginning Thursday, October 13th.  This six-week educational series designed to provide family caregivers with practical tools to care for themselves while caring for a loved one Unlocking Dementia through [place], Humor, and Understanding:  5-week series beginning September 7th Well-Spring Navigator:  02-[birth year]-2000 program, a free service to help individuals and families through the journey of determining care for older adults.  The "Navigator" is a [employer], Education officer, museum, who will speak with a prospective client and/or loved ones to provide an assessment of the situation and a set of recommendations for a personalized care plan -- all free of charge, and whether [employer] offers the needed service or not. If the need is not a service we provide, we are well-connected with reputable programs in town that we can refer you to.  www.well-[email or web address] or to speak with the Navigator, call [phone].

## 2021-10-11 ENCOUNTER — Other Ambulatory Visit: Payer: Self-pay | Admitting: Neurology

## 2021-10-11 DIAGNOSIS — G20 Parkinson's disease: Secondary | ICD-10-CM

## 2021-10-23 DIAGNOSIS — M25512 Pain in left shoulder: Secondary | ICD-10-CM | POA: Diagnosis not present

## 2021-10-25 ENCOUNTER — Ambulatory Visit: Payer: Self-pay | Admitting: *Deleted

## 2021-10-25 ENCOUNTER — Encounter: Payer: Self-pay | Admitting: Nurse Practitioner

## 2021-10-25 ENCOUNTER — Telehealth (INDEPENDENT_AMBULATORY_CARE_PROVIDER_SITE_OTHER): Payer: Medicare Other | Admitting: Nurse Practitioner

## 2021-10-25 DIAGNOSIS — U071 COVID-19: Secondary | ICD-10-CM | POA: Insufficient documentation

## 2021-10-25 MED ORDER — HYDROCOD POLST-CPM POLST ER 10-8 MG/5ML PO SUER: 5.0000 mL | Freq: Two times a day (BID) | ORAL | 0 refills | Status: DC | PRN

## 2021-10-25 MED ORDER — LIDOCAINE VISCOUS HCL 2 % MT SOLN: 15.0000 mL | OROMUCOSAL | 0 refills | Status: DC | PRN

## 2021-10-25 MED ORDER — MOLNUPIRAVIR EUA 200MG CAPSULE: 4.0000 | ORAL_CAPSULE | Freq: Two times a day (BID) | ORAL | 0 refills | Status: AC

## 2021-10-25 NOTE — Patient Instructions (Signed)

## 2021-10-25 NOTE — Assessment & Plan Note (Signed)
Acute with symptoms since 10/23/21 -- is vaccinated.  Discussed treatment options with patient at length.  Will start Brian Frederick, educated him on this and side effects.  For symptom relief will send in Tussionex and viscous Lidocaine to use as needed.  Recommend use of Coricidin as needed and Diabetic Tussin.  If worsening symptoms to immediately reach out to provider OR go to closest [facility] or ER setting.

## 2021-10-25 NOTE — Telephone Encounter (Signed)
Patient is calling to report he had + COVID test today. Patient states symptoms started 11/5- cough, sore throat still present. Patient advised per COVID protocol treatment/isolation. Patient is still experiencing cough, sore throat and is requesting medication to help with these symptoms. Patient states he is presently using prednisone prescribed by orthopedic for neck. Patient is moderate risk patient- with moderate symptoms. Call sent for PCP review and treatment

## 2021-10-25 NOTE — Telephone Encounter (Signed)
Summary: Home Test Positive for Covid 51   Pt's wife stated they were around their granddaughter on Saturday 10/21/21. Pt's granddaughter has tested positive for Covid-19. Pt started experiencing a dry cough and sore throat on 10/21/21 as well after visit. Pt took a home Covid test this morning and tested positive. Pt would like to speak with nurse about what he can do at home for symptoms. Please advise.  Callback [phone]      Reason for Disposition  [1] HIGH RISK for severe COVID complications (e.g., weak immune system, age > 48 years, obesity with BMI > 25, pregnant, chronic lung disease or other chronic medical condition) AND [2] COVID symptoms (e.g., cough, fever)  (Exceptions: Already seen by PCP and no new or worsening symptoms.)  Answer Assessment - Initial Assessment Questions 1. COVID-19 DIAGNOSIS: "Who made your COVID-19 diagnosis?" "Was it confirmed by a positive lab test or self-test?" If not diagnosed by a doctor (or NP/PA), ask "Are there lots of cases (community spread) where you live?" Note: See public health department website, if unsure.     + COVID home test today 2. COVID-19 EXPOSURE: "Was there any known exposure to COVID before the symptoms began?" CDC Definition of close contact: within 6 feet (2 meters) for a total of 15 minutes or more over a 24-hour period.      Granddaughter- visited-+COVID 3. ONSET: "When did the COVID-19 symptoms start?"      Symptoms started Saturday- worse today 4. WORST SYMPTOM: "What is your worst symptom?" (e.g., cough, fever, shortness of breath, muscle aches)     Cough, sore throat 5. COUGH: "Do you have a cough?" If Yes, ask: "How bad is the cough?"       Yes- spells at night 6. FEVER: "Do you have a fever?" If Yes, ask: "What is your temperature, how was it measured, and when did it start?"     no 7. RESPIRATORY STATUS: "Describe your breathing?" (e.g., shortness of breath, wheezing, unable to speak)      no 8. BETTER-SAME-WORSE: "Are  you getting better, staying the same or getting worse compared to yesterday?"  If getting worse, ask, "In what way?"     Same- some better 9. HIGH RISK DISEASE: "Do you have any chronic medical problems?" (e.g., asthma, heart or lung disease, weak immune system, obesity, etc.)     Diabetes, Parkinson  10. VACCINE: "Have you had the COVID-19 vaccine?" If Yes, ask: "Which one, how many shots, when did you get it?"       Yes- pfizer 11. BOOSTER: "Have you received your COVID-19 booster?" If Yes, ask: "Which one and when did you get it?"       Yes- 12/21 12. PREGNANCY: "Is there any chance you are pregnant?" "When was your last menstrual period?"       na 13. OTHER SYMPTOMS: "Do you have any other symptoms?"  (e.g., chills, fatigue, headache, loss of smell or taste, muscle pain, sore throat)       Sore throat 14. O2 SATURATION MONITOR:  "Do you use an oxygen saturation monitor (pulse oximeter) at home?" If Yes, ask "What is your reading (oxygen level) today?" "What is your usual oxygen saturation reading?" (e.g., 95%)       no  Protocols used: Coronavirus (COVID-19) Diagnosed or Suspected-A-AH

## 2021-10-25 NOTE — Telephone Encounter (Signed)
Mychart visit scheduled  

## 2021-10-25 NOTE — Progress Notes (Signed)
There were no vitals taken for this visit.   Subjective:    Patient ID: Brian Frederick, male    DOB: 1956/11/27, 65 y.o.   MRN: [id number]  HPI: Brian Frederick is a 65 y.o. male  Chief Complaint  Patient presents with   Covid Positive    Patient states he tested positive for COVID via at-home testing around lunch time today. Patient states he has a dry-cough, sore throat/raspy voice, sweating throughout the day and headaches. Patient states he has tried over the counter cough syrup and Theraflu. Patient states his symptoms started over this past weekend. Patient states when he tries to lay down he has to get up and go to the recliner. Patient states his granddaughter was COVID positive and she visited this weekend.    Cough   Sore Throat   Headache   This visit was completed via video visit through MyChart due to the restrictions of the COVID-19 pandemic. All issues as above were discussed and addressed. Physical exam was done as above through visual confirmation on video through MyChart. If it was felt that the patient should be evaluated in the office, they were directed there. The patient verbally consented to this visit. Location of the patient: home Location of the provider: work Those involved with this call:  Provider: [name], DNP CMA: Irena Reichmann, [place] Desk/Registration: [employer]  Time spent on call:  21 minutes with patient face to face via video conference. More than 50% of this time was spent in counseling and coordination of care. 15 minutes total spent in review of patient's record and preparation of their chart.  I verified patient identity using two factors (patient name and date of birth). Patient consents verbally to being seen via telemedicine visit today.    COVID POSITIVE Tested positive this morning at home, symptoms started Monday, 10/23/21.  Is Covid vaccinated.  Fever: no Cough: yes Shortness of breath: no Wheezing: no Chest pain:  no Chest tightness: no Chest congestion: no Nasal congestion: no Runny nose: no Post nasal drip: yes Sneezing: no Sore throat: yes Swollen glands: no Sinus pressure: yes Headache: yes Face pain: no Toothache: no Ear pain: none Ear pressure: yes bilateral Eyes red/itching:no Eye drainage/crusting: no  Vomiting: no Rash: no Fatigue: yes Sick contacts: yes -- exposure to his grand daughter who tested positive Strep contacts: no  Context: stable Recurrent sinusitis: no Relief with OTC cold/cough medications: yes  Treatments attempted: cold/sinus    Relevant past medical, surgical, family and social history reviewed and updated as indicated. Interim medical history since our last visit reviewed. Allergies and medications reviewed and updated.  Review of Systems  Constitutional:  Negative for activity change, appetite change, diaphoresis, fatigue and fever.  HENT:  Positive for postnasal drip, sinus pressure and sore throat. Negative for congestion, rhinorrhea and sinus pain.   Respiratory:  Positive for cough. Negative for chest tightness, shortness of breath and wheezing.   Cardiovascular: Negative.   Gastrointestinal: Negative.   Neurological: Negative.    Per HPI unless specifically indicated above     Objective:    There were no vitals taken for this visit.  Wt Readings from Last 3 Encounters:  10/10/21 255 lb (115.7 kg)  08/07/21 252 lb 12.8 oz (114.7 kg)  04/12/21 253 lb 12.8 oz (115.1 kg)    Physical Exam Vitals and nursing note reviewed.  Constitutional:      General: He is awake. He is not in acute distress.  Appearance: He is well-developed. He is not ill-appearing.  HENT:     Head: Normocephalic.     Right Ear: Hearing normal. No drainage.     Left Ear: Hearing normal. No drainage.  Eyes:     General: Lids are normal.        Right eye: No discharge.        Left eye: No discharge.     Conjunctiva/sclera: Conjunctivae normal.  Pulmonary:      Effort: Pulmonary effort is normal. No accessory muscle usage or respiratory distress.  Musculoskeletal:     Cervical back: Normal range of motion.  Neurological:     Mental Status: He is alert and oriented to person, place, and time.  Psychiatric:        Mood and Affect: Mood normal.        Behavior: Behavior normal. Behavior is cooperative.        Thought Content: Thought content normal.        Judgment: Judgment normal.    Results for orders placed or performed in visit on 08/30/21  HM DIABETES EYE EXAM  Result Value Ref Range   HM Diabetic Eye Exam No Retinopathy No Retinopathy      Assessment & Plan:   Problem List Items Addressed This Visit       Other   Lab test positive for detection of COVID-19 virus - Primary    Acute with symptoms since 10/23/21 -- is vaccinated.  Discussed treatment options with patient at length.  Will start [place], educated him on this and side effects.  For symptom relief will send in Tussionex and viscous Lidocaine to use as needed.  Recommend use of Coricidin as needed and Diabetic Tussin.  If worsening symptoms to immediately reach out to provider OR go to closest [facility] or ER setting.        Relevant Medications   chlorpheniramine-HYDROcodone (TUSSIONEX PENNKINETIC ER) 10-8 MG/5ML SUER   lidocaine (XYLOCAINE) 2 % solution    I discussed the assessment and treatment plan with the patient. The patient was provided an opportunity to ask questions and all were answered. The patient agreed with the plan and demonstrated an understanding of the instructions.   The patient was advised to call back or seek an in-person evaluation if the symptoms worsen or if the condition fails to improve as anticipated.   I provided 21+ minutes of time during this encounter.   Follow up plan: Return if symptoms worsen or fail to improve.

## 2021-10-31 DIAGNOSIS — R29898 Other symptoms and signs involving the musculoskeletal system: Secondary | ICD-10-CM | POA: Diagnosis not present

## 2021-10-31 DIAGNOSIS — M542 Cervicalgia: Secondary | ICD-10-CM | POA: Diagnosis not present

## 2021-10-31 DIAGNOSIS — M436 Torticollis: Secondary | ICD-10-CM | POA: Diagnosis not present

## 2021-11-03 DIAGNOSIS — M542 Cervicalgia: Secondary | ICD-10-CM | POA: Diagnosis not present

## 2021-11-03 DIAGNOSIS — R29898 Other symptoms and signs involving the musculoskeletal system: Secondary | ICD-10-CM | POA: Diagnosis not present

## 2021-11-03 DIAGNOSIS — M436 Torticollis: Secondary | ICD-10-CM | POA: Diagnosis not present

## 2021-11-07 ENCOUNTER — Encounter: Payer: Self-pay | Admitting: Nurse Practitioner

## 2021-11-07 ENCOUNTER — Other Ambulatory Visit: Payer: Self-pay

## 2021-11-07 ENCOUNTER — Ambulatory Visit (INDEPENDENT_AMBULATORY_CARE_PROVIDER_SITE_OTHER): Payer: Medicare Other | Admitting: Nurse Practitioner

## 2021-11-07 VITALS — BP 124/83 | HR 77 | Temp 98.8°F | Wt 243.0 lb

## 2021-11-07 DIAGNOSIS — Z23 Encounter for immunization: Secondary | ICD-10-CM

## 2021-11-07 DIAGNOSIS — F419 Anxiety disorder, unspecified: Secondary | ICD-10-CM

## 2021-11-07 DIAGNOSIS — E669 Obesity, unspecified: Secondary | ICD-10-CM

## 2021-11-07 DIAGNOSIS — E1169 Type 2 diabetes mellitus with other specified complication: Secondary | ICD-10-CM

## 2021-11-07 DIAGNOSIS — E1159 Type 2 diabetes mellitus with other circulatory complications: Secondary | ICD-10-CM

## 2021-11-07 DIAGNOSIS — G20 Parkinson's disease: Secondary | ICD-10-CM

## 2021-11-07 DIAGNOSIS — E785 Hyperlipidemia, unspecified: Secondary | ICD-10-CM

## 2021-11-07 LAB — BAYER DCA HB A1C WAIVED: HB A1C (BAYER DCA - WAIVED): 5.6 % (ref 4.8–5.6)

## 2021-11-07 MED ORDER — GABAPENTIN 100 MG PO CAPS: ORAL_CAPSULE | ORAL | 4 refills | Status: DC

## 2021-11-07 NOTE — Patient Instructions (Signed)

## 2021-11-07 NOTE — Assessment & Plan Note (Signed)
Followed up by neurology, continue medication regimen.

## 2021-11-07 NOTE — Assessment & Plan Note (Signed)
Chronic ongoing, A1c today 5.6%, patient eating a healthier diet and losing weight, weight loss benefit also from trulicity. Checks blood sugar daily with results between 110-120. Praised for doing a good job with weight management and encouraged to continue and to incorporate more exercise. Follow up in 3 months.

## 2021-11-07 NOTE — Assessment & Plan Note (Signed)
Chronic ongoing, continue medication regimen, labs done today. No issues with medication.

## 2021-11-07 NOTE — Assessment & Plan Note (Addendum)
Chronic ongoing, A1c 5.6% at goal today, continue medication regimen. Patient losing weight as a result of dietary changes and trulicity. C/O tingling in feet which patient states is chronic. Advised by pharmacist that he can increase gabapentin dose to help with tingling. Started taking gabapentin x2 in AM and x1 in PM, as a result he ran out of medication. Gabapentin increased to 300 mg daily in office today.

## 2021-11-07 NOTE — Assessment & Plan Note (Signed)
Chronic ongoing, stable on medication, no issues today.

## 2021-11-07 NOTE — Progress Notes (Signed)
BP 124/83   Pulse 77   Temp 98.8 F (37.1 C) (Oral)   Wt 243 lb (110.2 kg)   SpO2 95%   BMI 33.89 kg/m    Subjective:    Patient ID: Brian Frederick, male    DOB: Dec 07, 1956, 65 y.o.   MRN: [id number]  Chief Complaint  Patient presents with   Diabetes   Hyperlipidemia   Hypertension   Mood   Tremors  HPI: Brian Frederick is a 65 y.o. male, here for a routine follow up, C/O tingling in feet that is chronic, advised by pharmacist that he could increase the amount of gabapentin he is taking. Have been taking gabapentin x2 in AM and x1 in PM, ran out of his med because he has been taking extra dose. No other concerns, states he is doing good today.  NOTE WRITTEN BY [employer] DNP STUDENT.  ASSESSMENT AND PLAN OF CARE REVIEWED WITH STUDENT, AGREE WITH ABOVE FINDINGS AND PLAN.   DIABETES Checks blood sugar once daily normally runs between 110-120. Hypoglycemic episodes:no Polydipsia/polyuria: no Visual disturbance: no Chest pain: no Paresthesias: no Glucose Monitoring: yes  Accucheck frequency: Daily  Fasting glucose:  Post prandial:  Evening:  Before meals: Taking Insulin?: no  Blood Pressure Monitoring: not checking Retinal Examination: Up to Date Foot Exam: Up to Date Diabetic Education: Completed Pneumovax: Up to Date Influenza: Up to Date Aspirin: yes   HYPERTENSION / HYPERLIPIDEMIA Satisfied with current treatment? yes Duration of hypertension: chronic BP monitoring frequency: not checking BP medication side effects: no Past BP meds: lisinopril, metoprolol Duration of hyperlipidemia: chronic Cholesterol medication side effects: no Cholesterol supplements: none Past cholesterol medications: atorvastain (lipitor) Medication compliance: good compliance Aspirin: yes Recent stressors: no Recurrent headaches: no Visual changes: no Palpitations: no Dyspnea: no Chest pain: no Lower extremity edema: no Dizzy/lightheaded: no  Relevant past medical, surgical, family  and social history reviewed and updated as indicated. Interim medical history since our last visit reviewed. Allergies and medications reviewed and updated.  ANXIETY/STRESS Duration:stable Anxious mood: no  Excessive worrying: no Irritability: no  Sweating: no Nausea: no Palpitations:no Hyperventilation: no Panic attacks: no Agoraphobia: no  Obscessions/compulsions: no Depressed mood: no Depression screen [facility] 2/9 11/07/2021 08/07/2021 04/12/2021 01/11/2021 09/28/2020  Decreased Interest 0 0 0 0 0  Down, Depressed, Hopeless 0 0 0 0 0  PHQ - 2 Score 0 0 0 0 0  Altered sleeping 1 - 0 0 0  Tired, decreased energy 1 - 0 0 0  Change in appetite 0 - 0 0 0  Feeling bad or failure about yourself  0 - 0 0 0  Trouble concentrating 0 - 0 0 0  Moving slowly or fidgety/restless 0 - 0 0 0  Suicidal thoughts 0 - 0 0 0  PHQ-9 Score 2 - 0 0 0  Difficult doing work/chores - - Not difficult at all - -  Some recent data might be hidden   Anhedonia: no Weight changes: no Insomnia: no  Fatigue/loss of energy: occasionally Feelings of worthlessness: no Feelings of guilt: no Impaired concentration/indecisiveness: no Suicidal ideations: no  Crying spells: no Recent Stressors/Life Changes: no   Relationship problems: no   Family stress: no     Financial stress: no    Job stress: no    Recent death/loss: no   Review of Systems  Constitutional:  Negative for activity change and appetite change.  Respiratory:  Negative for shortness of breath.   Cardiovascular:  Negative for chest pain,  palpitations and leg swelling.  Gastrointestinal:  Negative for abdominal distention, abdominal pain, constipation, diarrhea and vomiting.  Endocrine: Negative for cold intolerance, heat intolerance, polydipsia, polyphagia and polyuria.  Neurological:  Positive for tremors. Negative for dizziness and numbness.       Chronic left hand tremor  Psychiatric/Behavioral:  Negative for behavioral problems and suicidal  ideas. The patient is not nervous/anxious.    Per HPI unless specifically indicated above     Objective:    BP 124/83   Pulse 77   Temp 98.8 F (37.1 C) (Oral)   Wt 243 lb (110.2 kg)   SpO2 95%   BMI 33.89 kg/m   Wt Readings from Last 3 Encounters:  11/07/21 243 lb (110.2 kg)  10/10/21 255 lb (115.7 kg)  08/07/21 252 lb 12.8 oz (114.7 kg)    Physical Exam Vitals and nursing note reviewed.  Constitutional:      General: He is not in acute distress.    Appearance: Normal appearance. He is obese. He is not ill-appearing or toxic-appearing.  HENT:     Head: Normocephalic.  Eyes:     General: Lids are normal.  Neck:     Thyroid: No thyroid mass, thyromegaly or thyroid tenderness.     Vascular: No carotid bruit.     Trachea: Trachea normal.  Cardiovascular:     Rate and Rhythm: Normal rate and regular rhythm.     Heart sounds: Normal heart sounds, S1 normal and S2 normal.  Pulmonary:     Effort: Pulmonary effort is normal.     Breath sounds: Normal breath sounds and air entry.  Abdominal:     General: Abdomen is flat. Bowel sounds are normal. There is no distension.     Palpations: Abdomen is soft.     Tenderness: There is no abdominal tenderness.  Musculoskeletal:     Right lower leg: No edema.     Left lower leg: No edema.  Lymphadenopathy:     Head:     Right side of head: No submental or submandibular adenopathy.     Left side of head: No submental or submandibular adenopathy.     Cervical: No cervical adenopathy.     Right cervical: No superficial, deep or posterior cervical adenopathy.    Left cervical: No superficial, deep or posterior cervical adenopathy.     Upper Body:     Right upper body: No supraclavicular adenopathy.     Left upper body: No supraclavicular adenopathy.  Skin:    General: Skin is warm.  Neurological:     Mental Status: He is alert and oriented to person, place, and time.  Psychiatric:        Mood and Affect: Mood normal.         Behavior: Behavior normal. Behavior is cooperative.        Thought Content: Thought content normal.    Results for orders placed or performed in visit on 08/30/21  HM DIABETES EYE EXAM  Result Value Ref Range   HM Diabetic Eye Exam No Retinopathy No Retinopathy      Assessment & Plan:   Problem List Items Addressed This Visit       Cardiovascular and Mediastinum   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HCC)    Chronic ongoing, A1c today 5.6%, patient eating a healthier diet and losing weight, weight loss benefit also from trulicity. Checks blood sugar daily with results between 110-120. Praised for doing a good job with [facility]  and encouraged to continue and to incorporate more exercise. Follow up in 3 months.      Relevant Orders   Bayer DCA Hb A1c Waived     Endocrine   Hyperlipidemia associated with type 2 diabetes mellitus (HCC)    Chronic ongoing, continue medication regimen, labs done today. No issues with medication.       Relevant Orders   Bayer DCA Hb A1c Waived   Type 2 diabetes mellitus with obesity (HCC) - Primary    Chronic ongoing, A1c 5.6% at goal today, continue medication regimen. Patient losing weight as a result of dietary changes and trulicity. C/O tingling in feet which patient states is chronic. Advised by pharmacist that he can increase gabapentin dose to help with tingling. Started taking gabapentin x2 in AM and x1 in PM, as a result he ran out of medication. Gabapentin increased to 300 mg daily in office today.      Relevant Orders   Bayer DCA Hb A1c Waived     Nervous and Auditory   Parkinson's disease ([place])    Followed up by neurology, continue medication regimen.       Relevant Medications   gabapentin (NEURONTIN) 100 MG capsule     Other   Morbid obesity ([place])    Chronic ongoing, patient making dietary changes and taking trulicity, both helping with weight loss. Encouraged to also incorporate exercises.        Chronic anxiety    Chronic ongoing, stable on medication, no issues today.      Other Visit Diagnoses     Flu vaccine need       got in office today   Relevant Orders   Flu Vaccine QUAD High Dose(Fluad) (Completed)        Follow up plan: Return in about 3 months (around 02/07/2022) for T2DM, HTN/HLD, PD, BPH.

## 2021-11-07 NOTE — Assessment & Plan Note (Signed)
Chronic ongoing, patient making dietary changes and taking trulicity, both helping with weight loss. Encouraged to also incorporate exercises.

## 2021-11-13 ENCOUNTER — Other Ambulatory Visit: Payer: Self-pay

## 2021-11-13 MED ORDER — CARBIDOPA-LEVODOPA 25-100 MG PO TABS: ORAL_TABLET | ORAL | 1 refills | Status: DC

## 2021-11-22 DIAGNOSIS — R29898 Other symptoms and signs involving the musculoskeletal system: Secondary | ICD-10-CM | POA: Diagnosis not present

## 2021-11-22 DIAGNOSIS — M542 Cervicalgia: Secondary | ICD-10-CM | POA: Diagnosis not present

## 2021-11-22 DIAGNOSIS — M436 Torticollis: Secondary | ICD-10-CM | POA: Diagnosis not present

## 2021-11-24 DIAGNOSIS — M542 Cervicalgia: Secondary | ICD-10-CM | POA: Diagnosis not present

## 2021-11-24 DIAGNOSIS — M436 Torticollis: Secondary | ICD-10-CM | POA: Diagnosis not present

## 2021-11-24 DIAGNOSIS — R29898 Other symptoms and signs involving the musculoskeletal system: Secondary | ICD-10-CM | POA: Diagnosis not present

## 2021-11-29 DIAGNOSIS — M542 Cervicalgia: Secondary | ICD-10-CM | POA: Diagnosis not present

## 2021-11-29 DIAGNOSIS — M436 Torticollis: Secondary | ICD-10-CM | POA: Diagnosis not present

## 2021-11-29 DIAGNOSIS — R29898 Other symptoms and signs involving the musculoskeletal system: Secondary | ICD-10-CM | POA: Diagnosis not present

## 2021-12-06 DIAGNOSIS — M436 Torticollis: Secondary | ICD-10-CM | POA: Diagnosis not present

## 2021-12-06 DIAGNOSIS — R29898 Other symptoms and signs involving the musculoskeletal system: Secondary | ICD-10-CM | POA: Diagnosis not present

## 2021-12-06 DIAGNOSIS — M542 Cervicalgia: Secondary | ICD-10-CM | POA: Diagnosis not present

## 2021-12-13 ENCOUNTER — Ambulatory Visit (INDEPENDENT_AMBULATORY_CARE_PROVIDER_SITE_OTHER): Payer: Medicare Other | Admitting: *Deleted

## 2021-12-13 DIAGNOSIS — Z0000 Encounter for general adult medical examination without abnormal findings: Secondary | ICD-10-CM | POA: Diagnosis not present

## 2021-12-13 NOTE — Patient Instructions (Signed)
Brian Frederick , Thank you for taking time to come for your Medicare Wellness Visit. I appreciate your ongoing commitment to your health goals. Please review the following plan we discussed and let me know if I can assist you in the future.   Screening recommendations/referrals: Colonoscopy: up to date Recommended yearly ophthalmology/optometry visit for glaucoma screening and checkup Recommended yearly dental visit for hygiene and checkup  Vaccinations: Influenza vaccine: up to date Pneumococcal vaccine: up to date Tdap vaccine: up to date Shingles vaccine: up to date    Advanced directives: yes  Conditions/risks identified:   Next appointment: 02-07-2022 @ 10:00  [place] 65 Years and Older, Male Preventive care refers to lifestyle choices and visits with your health care provider that can promote health and wellness. What does preventive care include? A yearly physical exam. This is also called an annual well check. Dental exams once or twice a year. Routine eye exams. Ask your health care provider how often you should have your eyes checked. Personal lifestyle choices, including: Daily care of your teeth and gums. Regular physical activity. Eating a healthy diet. Avoiding tobacco and drug use. Limiting alcohol use. Practicing safe sex. Taking low doses of aspirin every day. Taking vitamin and mineral supplements as recommended by your health care provider. What happens during an annual well check? The services and screenings done by your health care provider during your annual well check will depend on your age, overall health, lifestyle risk factors, and family history of disease. Counseling  Your health care provider may ask you questions about your: Alcohol use. Tobacco use. Drug use. Emotional well-being. Home and relationship well-being. Sexual activity. Eating habits. History of falls. Memory and ability to understand (cognition). Work and work  Statistician. Screening  You may have the following tests or measurements: Height, weight, and BMI. Blood pressure. Lipid and cholesterol levels. These may be checked every 5 years, or more frequently if you are over 23 years old. Skin check. Lung cancer screening. You may have this screening every year starting at age 61 if you have a 30-pack-year history of smoking and currently smoke or have quit within the past 15 years. Fecal occult blood test (FOBT) of the stool. You may have this test every year starting at age 81. Flexible sigmoidoscopy or colonoscopy. You may have a sigmoidoscopy every 5 years or a colonoscopy every 10 years starting at age 1. Prostate cancer screening. Recommendations will vary depending on your family history and other risks. Hepatitis C blood test. Hepatitis B blood test. Sexually transmitted disease (STD) testing. Diabetes screening. This is done by checking your blood sugar (glucose) after you have not eaten for a while (fasting). You may have this done every 1-3 years. Abdominal aortic aneurysm (AAA) screening. You may need this if you are a current or former smoker. Osteoporosis. You may be screened starting at age 57 if you are at high risk. Talk with your health care provider about your test results, treatment options, and if necessary, the need for more tests. Vaccines  Your health care provider may recommend certain vaccines, such as: Influenza vaccine. This is recommended every year. Tetanus, diphtheria, and acellular pertussis (Tdap, Td) vaccine. You may need a Td booster every 10 years. Zoster vaccine. You may need this after age 20. Pneumococcal 13-valent conjugate (PCV13) vaccine. One dose is recommended after age 38. Pneumococcal polysaccharide (PPSV23) vaccine. One dose is recommended after age 81. Talk to your health care provider about which screenings and  vaccines you need and how often you need them. This information is not intended to replace  advice given to you by your health care provider. Make sure you discuss any questions you have with your health care provider. Document Released: 12/30/2015 Document Revised: 08/22/2016 Document Reviewed: 10/04/2015 Elsevier Interactive Patient Education  2017 [place] Prevention in the Home Falls can cause injuries. They can happen to people of all ages. There are many things you can do to make your home safe and to help prevent falls. What can I do on the outside of my home? Regularly fix the edges of walkways and driveways and fix any cracks. Remove anything that might make you trip as you walk through a door, such as a raised step or threshold. Trim any bushes or trees on the path to your home. Use bright outdoor lighting. Clear any walking paths of anything that might make someone trip, such as rocks or tools. Regularly check to see if handrails are loose or broken. Make sure that both sides of any steps have handrails. Any raised decks and porches should have guardrails on the edges. Have any leaves, snow, or ice cleared regularly. Use sand or salt on walking paths during winter. Clean up any spills in your garage right away. This includes oil or grease spills. What can I do in the bathroom? Use night lights. Install grab bars by the toilet and in the tub and shower. Do not use towel bars as grab bars. Use non-skid mats or decals in the tub or shower. If you need to sit down in the shower, use a plastic, non-slip stool. Keep the floor dry. Clean up any water that spills on the floor as soon as it happens. Remove soap buildup in the tub or shower regularly. Attach bath mats securely with double-sided non-slip rug tape. Do not have throw rugs and other things on the floor that can make you trip. What can I do in the bedroom? Use night lights. Make sure that you have a light by your bed that is easy to reach. Do not use any sheets or blankets that are too big for your bed.  They should not hang down onto the floor. Have a firm chair that has side arms. You can use this for support while you get dressed. Do not have throw rugs and other things on the floor that can make you trip. What can I do in the kitchen? Clean up any spills right away. Avoid walking on wet floors. Keep items that you use a lot in easy-to-reach places. If you need to reach something above you, use a strong step stool that has a grab bar. Keep electrical cords out of the way. Do not use floor polish or wax that makes floors slippery. If you must use wax, use non-skid floor wax. Do not have throw rugs and other things on the floor that can make you trip. What can I do with my stairs? Do not leave any items on the stairs. Make sure that there are handrails on both sides of the stairs and use them. Fix handrails that are broken or loose. Make sure that handrails are as long as the stairways. Check any carpeting to make sure that it is firmly attached to the stairs. Fix any carpet that is loose or worn. Avoid having throw rugs at the top or bottom of the stairs. If you do have throw rugs, attach them to the floor with carpet tape. Make  sure that you have a light switch at the top of the stairs and the bottom of the stairs. If you do not have them, ask someone to add them for you. What else can I do to help prevent falls? Wear shoes that: Do not have high heels. Have rubber bottoms. Are comfortable and fit you well. Are closed at the toe. Do not wear sandals. If you use a stepladder: Make sure that it is fully opened. Do not climb a closed stepladder. Make sure that both sides of the stepladder are locked into place. Ask someone to hold it for you, if possible. Clearly mark and make sure that you can see: Any grab bars or handrails. First and last steps. Where the edge of each step is. Use tools that help you move around (mobility aids) if they are needed. These  include: Canes. Walkers. Scooters. Crutches. Turn on the lights when you go into a dark area. Replace any light bulbs as soon as they burn out. Set up your furniture so you have a clear path. Avoid moving your furniture around. If any of your floors are uneven, fix them. If there are any pets around you, be aware of where they are. Review your medicines with your doctor. Some medicines can make you feel dizzy. This can increase your chance of falling. Ask your doctor what other things that you can do to help prevent falls. This information is not intended to replace advice given to you by your health care provider. Make sure you discuss any questions you have with your health care provider. Document Released: 09/29/2009 Document Revised: 05/10/2016 Document Reviewed: 01/07/2015 Elsevier Interactive Patient Education  2017 [employer].

## 2021-12-13 NOTE — Progress Notes (Signed)
Subjective:   Brian Frederick is a 65 y.o. male who presents for Medicare Annual/Subsequent preventive examination.  I connected with  Brian Frederick on 12/13/21 by a telephone enabled telemedicine application and verified that I am speaking with the correct person using two identifiers.   I discussed the limitations of evaluation and management by telemedicine. The patient expressed understanding and agreed to proceed.  Patient location: home  Provider location: Tele-Health  not in office    Review of Systems     Cardiac Risk Factors include: advanced age (>74mn, >>70women);diabetes mellitus;hypertension;male gender     Objective:    Today's Vitals   There is no height or weight on file to calculate BMI.  Advanced Directives 12/13/2021 10/10/2021 04/06/2021 10/25/2020 05/24/2020 11/23/2019 06/15/2019  Does Patient Have a Medical Advance Directive? Yes Yes Yes Yes Yes Yes Yes  Type of AAcademic librarianLiving will -[place] will - Living will;Healthcare Power of Attorney Living will;Healthcare Power of Attorney  Does patient want to make changes to medical advance directive? - - - - - - -  Copy of [place] Chart? No - copy requested - - - - - -    Current Medications (verified) Outpatient Encounter Medications as of 12/13/2021  Medication Sig   aspirin 81 MG tablet Take 81 mg by mouth daily.   atorvastatin (LIPITOR) 20 MG tablet Take 1 tablet (20 mg total) by mouth daily.   BD PEN NEEDLE NANO 2ND GEN 32G X 4 MM MISC USE 1 PEN NEEDLE EVERY MORNING   blood glucose meter kit and supplies KIT Dispense based on patient and insurance preference. Use up to four times daily as directed. (FOR ICD-9 250.00, 250.01).   carbidopa-levodopa (SINEMET IR) 25-100 MG tablet 2 at 8am/2 at noon/1 at 4pm   chlorpheniramine-HYDROcodone ([facility]) 10-8 MG/5ML SUER Take 5 mLs by mouth every 12 (twelve) hours  as needed for cough.   COVID-19 At Home Antigen Test ([facility]) KIT BinaxNOW COVID-19 Ag Self Test kit  TEST AS DIRECTED TODAY   Dulaglutide (TRULICITY) 1.5 MIH/4.7QQSOPN ADMINISTER 1.5 MG UNDER THE SKIN 1 TIME A WEEK   escitalopram (LEXAPRO) 10 MG tablet Take 1 tablet (10 mg total) by mouth daily.   fluorouracil (EFUDEX) 5 % cream Apply topically 2 (two) times daily. For 1 week to temples and ears   gabapentin (NEURONTIN) 100 MG capsule TAKE 3 CAPSULE(300 MG) BY MOUTH ONCE DAILY.   lansoprazole (PREVACID) 30 MG capsule TAKE 1 CAPSULE BY MOUTH DAILY AT 12 NOON   lidocaine (XYLOCAINE) 2 % solution Use as directed 15 mLs in the mouth or throat as needed for mouth pain.   lisinopril (ZESTRIL) 5 MG tablet TAKE 1 TABLET(5 MG) BY MOUTH DAILY   meloxicam (MOBIC) 15 MG tablet Mobic 15 mg tablet  Take 1 tablet every day by oral route as directed.   metFORMIN (GLUCOPHAGE-XR) 500 MG 24 hr tablet TAKE 2 TABLETS(1000 MG) BY MOUTH TWICE DAILY.   metoprolol succinate (TOPROL-XL) 100 MG 24 hr tablet TAKE 1 TABLET BY MOUTH EVERY DAY WITH FOOD OR IMMEDIATELY AFTER A MEAL   Multiple Vitamin (MULTIVITAMIN) tablet Take 1 tablet by mouth daily.   ONETOUCH VERIO test strip USE TO TEST BLOOD SUGAR UP TO FOUR TIMES DAILY AS DIRECTED   sildenafil (VIAGRA) 100 MG tablet Take 0.5-1 tablets (50-100 mg total) by mouth daily as needed for erectile dysfunction.  tiZANidine (ZANAFLEX) 4 MG tablet tizanidine 4 mg tablet  Take 1 tablet(s)at bedtime as needed   traMADol (ULTRAM) 50 MG tablet Ultram 50 mg tablet  Take 1 tablet every 6 hours by oral route.   trihexyphenidyl (ARTANE) 2 MG tablet TAKE 1 TABLET(2 MG) BY MOUTH THREE TIMES DAILY WITH MEALS   No facility-administered encounter medications on file as of 12/13/2021.    Allergies (verified) Amoxil [amoxicillin] and Penicillin g benzathine   History: Past Medical History:  Diagnosis Date   Achilles tendon disorder    Anxiety    Basal cell  carcinoma 06/23/2008   Mid dorsum nose. Ulcerated.   BPH (benign prostatic hypertrophy)    Cancer (HCC)    basal cell   Chronic kidney disease    Hyperlipidemia    Hypertension    Hypogonadism in male    Osteoarthritis    Prediabetes    Squamous cell carcinoma of skin 10/27/2013   Right distal lateral thigh. SCCis   SVT (supraventricular tachycardia) (HCC)    Past Surgical History:  Procedure Laterality Date   BASAL CELL CARCINOMA EXCISION     cardio oblation     CHOLECYSTECTOMY     COLONOSCOPY WITH PROPOFOL N/A 04/12/2017   Procedure: COLONOSCOPY WITH PROPOFOL;  Surgeon: [name], MD;  Location: [facility];  Service: Endoscopy;  Laterality: N/A;   JOINT REPLACEMENT Left    shoulder   JOINT REPLACEMENT Right    shoulder   POLYPECTOMY N/A 04/12/2017   Procedure: POLYPECTOMY;  Surgeon: [name], MD;  Location: [place];  Service: Endoscopy;  Laterality: N/A;   SHOULDER SURGERY Right 11/28/2018   Family History  Problem Relation Age of Onset   Cancer Mother        breast   Heart disease Father 80       CABG   Diabetes Father    CAD Father    Hyperlipidemia Father    Hypertension Father    Cancer Sister        breast   Liver cancer Sister    Hypothyroidism Sister    Stroke Maternal Grandfather    Diabetes Paternal Grandmother    Social History   Socioeconomic History   Marital status: Married    Spouse name: Not on file   Number of children: 3   Years of education: Not on file   Highest education level: Associate degree: occupational, Hotel manager, or vocational program  Occupational History   Occupation: retired    Comment: Dealer  Tobacco Use   Smoking status: Never   Smokeless tobacco: Never  Scientific laboratory technician Use: Never used  Substance and Sexual Activity   Alcohol use: No    Alcohol/week: 0.0 standard drinks   Drug use: No   Sexual activity: Yes  Other Topics Concern   Not on file  Social History Narrative   Married  lives with spouse   Has 3 kids biological 1 step child   Right handed   Drinks coffee 2 cups a day, sweet tea with meals, 2-3 soda's a day   Social Determinants of Radio broadcast assistant Strain: Low Risk    Difficulty of Paying Living Expenses: Not very hard  Food Insecurity: No Food Insecurity   Worried About Charity fundraiser in the Last Year: Never true   [place] in the Last Year: Never true  Transportation Needs: No Transportation Needs   Lack of Transportation (Medical): No   Lack  of Transportation (Non-Medical): No  Physical Activity: Inactive   Days of Exercise per Week: 0 days   Minutes of Exercise per Session: 0 min  Stress: No Stress Concern Present   Feeling of Stress : Only a little  Social Connections: Moderately Isolated   Frequency of Communication with Friends and Family: Three times a week   Frequency of Social Gatherings with Friends and Family: Three times a week   Attends Religious Services: Never   Active Member of Clubs or Organizations: No   Attends Music therapist: Never   Marital Status: Married    Tobacco Counseling Counseling given: Not Answered   Clinical Intake:  Pre-visit preparation completed: Yes  Pain : No/denies pain     Nutritional Risks: None Diabetes: Yes CBG done?: No Did pt. bring in CBG monitor from home?: No  How often do you need to have someone help you when you read instructions, pamphlets, or other written materials from your doctor or pharmacy?: 1 - Never  Diabetic?  Yes  Nutrition Risk Assessment:  Has the patient had any N/V/D within the last 2 months?  No  Does the patient have any non-healing wounds?  No  Has the patient had any unintentional weight loss or weight gain?  No   Diabetes:  Is the patient diabetic?  Yes  If diabetic, was a CBG obtained today?  No  Did the patient bring in their glucometer from home?  No  How often do you monitor your CBG's? 1 x a day.   Financial  Strains and Diabetes Management:  Are you having any financial strains with the device, your supplies or your medication? No .  Does the patient want to be seen by Chronic Care Management for management of their diabetes?  No  Would the patient like to be referred to a Nutritionist or for Diabetic Management?  No   Diabetic Exams:  Diabetic Eye Exam: Completed . Overdue for diabetic eye exam. Pt has been advised about the importance in completing this exam.  Diabetic Foot Exam:  . Pt has been advised about the importance in completing this exam.   Interpreter Needed?: No  Information entered by :: Leroy Kennedy LPN   Activities of Daily Living In your present state of health, do you have any difficulty performing the following activities: 12/13/2021 08/07/2021  Hearing? N N  Vision? Y N  Comment mild has not seen audiology yet -  Difficulty concentrating or making decisions? N N  Walking or climbing stairs? N N  Dressing or bathing? N N  Doing errands, shopping? N N  Preparing Food and eating ? N -  Using the Toilet? N -  In the past six months, have you accidently leaked urine? N -  Do you have problems with loss of bowel control? N -  Managing your Medications? N -  Managing your Finances? N -  Housekeeping or managing your Housekeeping? N -  Some recent data might be hidden    Patient Care Team: [name], NP as PCP - General (Nurse Practitioner) Tat, Eustace Quail, DO as Consulting Physician (Neurology) [name], [facility] (Pharmacist)  Indicate any recent Medical Services you may have received from other than [facility] providers in the past year (date may be approximate).     Assessment:   This is a routine wellness examination for [place].  Hearing/Vision screen Hearing Screening - Comments:: Mild hearing loss  Does not wear hearing aids Vision Screening - Comments:: Up  to date [facility]  Dietary issues and exercise activities discussed: Current Exercise Habits:  The patient does not participate in regular exercise at present   Goals Addressed             This Visit's Progress    Patient Stated       Patient would like to keep A1C down Continue current life style       Depression Screen PHQ 2/9 Scores 12/13/2021 11/07/2021 08/07/2021 04/12/2021 01/11/2021 09/28/2020 05/24/2020  PHQ - 2 Score 0 0 0 0 0 0 0  PHQ- 9 Score 0 2 - 0 0 0 -    Fall Risk Fall Risk  12/13/2021 10/10/2021 04/06/2021 01/11/2021 10/25/2020  Falls in the past year? 0 0 0 0 0  Number falls in past yr: 0 0 0 - 0  Injury with Fall? 0 0 0 - 0  Follow up Falls evaluation completed;Falls prevention discussed;Education provided - - - -    FALL RISK PREVENTION PERTAINING TO THE HOME:  Any stairs in or around the home? No  If so, are there any without handrails? No  Home free of loose throw rugs in walkways, pet beds, electrical cords, etc? Yes  Adequate lighting in your home to reduce risk of falls? Yes   ASSISTIVE DEVICES UTILIZED TO PREVENT FALLS:  Life alert? No  Use of a cane, walker or w/c? No  Grab bars in the bathroom? Yes  Shower chair or bench in shower? No  Elevated toilet seat or a handicapped toilet? Yes   TIMED UP AND GO:  Was the test performed? No .   Cognitive Function:   Normal cognitive status assessed by direct observation by this Nurse Health Advisor. No abnormalities found.        Immunizations Immunization History  Administered Date(s) Administered   Fluad Quad(high Dose 65+) 11/07/2021   Influenza,inj,Quad PF,6+ Mos 09/23/2015, 09/25/2016, 09/29/2017, 09/28/2018, 09/21/2019, 09/28/2020   Influenza-Unspecified 09/29/2017, 09/28/2018   PFIZER(Purple Top)SARS-COV-2 Vaccination 03/16/2020, 04/06/2020, 10/21/2020   Pneumococcal Conjugate-13 01/11/2021   Pneumococcal Polysaccharide-23 08/26/2013   Td 08/19/2002   Tdap 11/05/2012   Zoster Recombinat (Shingrix) 09/29/2017, 12/08/2017   Zoster, Live 03/23/2016    TDAP status: Up to  date  Flu Vaccine status: Up to date  Pneumococcal vaccine status: Up to date  Covid-19 vaccine status: Information provided on how to obtain vaccines.   Qualifies for Shingles Vaccine? No   Zostavax completed Yes   Shingrix Completed?: Yes  Screening Tests Health Maintenance  Topic Date Due   COVID-19 Vaccine (4 - Booster for Pfizer series) 12/16/2020   Pneumonia Vaccine 89+ Years old (3 - PPSV23 if available, else PCV20) 01/11/2022   COLONOSCOPY (Pts 45-29yr Insurance coverage will need to be confirmed)  04/12/2022   HEMOGLOBIN A1C  05/07/2022   FOOT EXAM  08/07/2022   OPHTHALMOLOGY EXAM  08/25/2022   TETANUS/TDAP  11/05/2022   INFLUENZA VACCINE  Completed   Hepatitis C Screening  Completed   Zoster Vaccines- Shingrix  Completed   HPV VACCINES  Aged Out    Health Maintenance  Health Maintenance Due  Topic Date Due   COVID-19 Vaccine (4 - Booster for Pfizer series) 12/16/2020   Pneumonia Vaccine 66 Years old (3 - PPSV23 if available, else PCV20) 01/11/2022    Colorectal cancer screening: Type of screening: Colonoscopy. Completed 2018. Repeat every 5 years  Lung Cancer Screening: (Low Dose CT Chest recommended if Age 66-80years, 30 pack-year currently smoking OR have quit w/in 15years.)  does not qualify.   Lung Cancer Screening Referral:   Additional Screening:  Hepatitis C Screening: does not qualify; Completed 2017  Vision Screening: Recommended annual ophthalmology exams for early detection of glaucoma and other disorders of the eye. Is the patient up to date with their annual eye exam?  Yes  Who is the provider or what is the name of the office in which the patient attends annual eye exams? [facility] If pt is not established with a provider, would they like to be referred to a provider to establish care? No .   Dental Screening: Recommended annual dental exams for proper oral hygiene  Community Resource Referral / Chronic Care Management: CRR required  this visit?  No   CCM required this visit?  No      Plan:     I have personally reviewed and noted the following in the patients chart:   Medical and social history Use of alcohol, tobacco or illicit drugs  Current medications and supplements including opioid prescriptions. Patient is not currently taking opioid prescriptions. Functional ability and status Nutritional status Physical activity Advanced directives List of other physicians Hospitalizations, surgeries, and ER visits in previous 12 months Vitals Screenings to include cognitive, depression, and falls Referrals and appointments  In addition, I have reviewed and discussed with patient certain preventive protocols, quality metrics, and best practice recommendations. A written personalized care plan for preventive services as well as general preventive health recommendations were provided to patient.     Leroy Kennedy, LPN   [id number]   Nurse Notes:

## 2022-02-04 NOTE — Patient Instructions (Signed)

## 2022-02-07 ENCOUNTER — Ambulatory Visit (INDEPENDENT_AMBULATORY_CARE_PROVIDER_SITE_OTHER): Payer: Medicare Other | Admitting: Nurse Practitioner

## 2022-02-07 ENCOUNTER — Encounter: Payer: Self-pay | Admitting: Nurse Practitioner

## 2022-02-07 ENCOUNTER — Other Ambulatory Visit: Payer: Self-pay

## 2022-02-07 VITALS — BP 110/75 | HR 83 | Temp 98.8°F | Ht 71.0 in | Wt 251.4 lb

## 2022-02-07 DIAGNOSIS — Z6833 Body mass index (BMI) 33.0-33.9, adult: Secondary | ICD-10-CM

## 2022-02-07 DIAGNOSIS — E6609 Other obesity due to excess calories: Secondary | ICD-10-CM | POA: Diagnosis not present

## 2022-02-07 DIAGNOSIS — E669 Obesity, unspecified: Secondary | ICD-10-CM

## 2022-02-07 DIAGNOSIS — G20 Parkinson's disease: Secondary | ICD-10-CM | POA: Diagnosis not present

## 2022-02-07 DIAGNOSIS — Z23 Encounter for immunization: Secondary | ICD-10-CM

## 2022-02-07 DIAGNOSIS — E1159 Type 2 diabetes mellitus with other circulatory complications: Secondary | ICD-10-CM | POA: Diagnosis not present

## 2022-02-07 DIAGNOSIS — E1169 Type 2 diabetes mellitus with other specified complication: Secondary | ICD-10-CM | POA: Diagnosis not present

## 2022-02-07 DIAGNOSIS — E785 Hyperlipidemia, unspecified: Secondary | ICD-10-CM | POA: Diagnosis not present

## 2022-02-07 DIAGNOSIS — F419 Anxiety disorder, unspecified: Secondary | ICD-10-CM

## 2022-02-07 LAB — MICROALBUMIN, URINE WAIVED
Creatinine, Urine Waived: 200 mg/dL (ref 10–300)
Microalb, Ur Waived: 30 mg/L — ABNORMAL HIGH (ref 0–19)
Microalb/Creat Ratio: <30 mg/g (ref <30)

## 2022-02-07 LAB — BAYER DCA HB A1C WAIVED: HB A1C (BAYER DCA - WAIVED): 5.7 % — ABNORMAL HIGH (ref 4.8–5.6)

## 2022-02-07 MED ORDER — ESCITALOPRAM OXALATE 10 MG PO TABS: 10.0000 mg | ORAL_TABLET | Freq: Every day | ORAL | 4 refills | Status: DC

## 2022-02-07 MED ORDER — LISINOPRIL 5 MG PO TABS: ORAL_TABLET | ORAL | 4 refills | Status: DC

## 2022-02-07 MED ORDER — GABAPENTIN 100 MG PO CAPS: ORAL_CAPSULE | ORAL | 4 refills | Status: DC

## 2022-02-07 MED ORDER — ATORVASTATIN CALCIUM 20 MG PO TABS: 20.0000 mg | ORAL_TABLET | Freq: Every day | ORAL | 4 refills | Status: DC

## 2022-02-07 MED ORDER — TRULICITY 1.5 MG/0.5ML ~~LOC~~ SOAJ: SUBCUTANEOUS | 4 refills | Status: DC

## 2022-02-07 MED ORDER — METFORMIN HCL ER 500 MG PO TB24: ORAL_TABLET | ORAL | 4 refills | Status: DC

## 2022-02-07 MED ORDER — METOPROLOL SUCCINATE ER 100 MG PO TB24: ORAL_TABLET | ORAL | 4 refills | Status: DC

## 2022-02-07 NOTE — Assessment & Plan Note (Signed)
Chronic, stable on Lexapro.  Continue low dose medication and monitor.  Denies SI/HI. 

## 2022-02-07 NOTE — Progress Notes (Signed)
BP 110/75    Pulse 83    Temp 98.8 F (37.1 C) (Oral)    Ht 5\' 11"  (1.803 m)    Wt 251 lb 6.4 oz (114 kg)    SpO2 96%    BMI 35.06 kg/m    Subjective:    Patient ID: ARISTON GRANDISON, male    DOB: 25-Feb-1956, 66 y.o.   MRN: [id number]  HPI: BRALLAN DENIO is a 66 y.o. male  Chief Complaint  Patient presents with   Diabetes   Hyperlipidemia   Hypertension   DIABETES Taking Metformin 676 MG BID and Trulicity 1.5 MG.  Last A1c November 5.6%.  No longer on insulin, was able to come off this.    Does take Gabapentin 100 MG BID for neuropathy. Hypoglycemic episodes:no Polydipsia/polyuria: no Visual disturbance: no Chest pain: no Paresthesias: no Glucose Monitoring: yes             Accucheck frequency: Daily             Fasting glucose: 110 to 125             Post prandial:             Evening:             Before meals: Taking Insulin?: yes             Long acting insulin: Levemir 16 units now, was 35 units             Short acting insulin: Blood Pressure Monitoring: weekly Retinal Examination: Up to Date -- [facility] Exam: Up to Date Pneumovax: Up to Date Influenza: Up to Date Aspirin: yes   HYPERTENSION / HYPERLIPIDEMIA Takes Lisinopril 5 MG daily, Metoprolol 100 MG daily, and Lipitor 20 MG + ASA. Satisfied with current treatment? yes Duration of hypertension: chronic BP monitoring frequency: not checking BP range:  BP medication side effects: no Duration of hyperlipidemia: chronic Cholesterol medication side effects: no Cholesterol supplements: none Medication compliance: good compliance Aspirin: yes Recent stressors: no Recurrent headaches: no Visual changes: no Palpitations: no Dyspnea: no Chest pain: no Lower extremity edema: occasional Dizzy/lightheaded: no    PARKINSON'S DISEASE: Diagnosed in 2015.  Sees Dr. Carles Collet. Denies any recent falls or decline in function.  Reports good control on current regimen.  Saw Dr. Carles Collet last on 10/10/21 and Sinemet  was increased in dosage. Goes to dermatology, last visit in August 2022.  Recently did a short period of physical therapy for neck discomfort.   ANXIETY/STRESS On Lexapro 10 MG daily. Duration:controlled Anxious mood: no  Excessive worrying: no Irritability: no  Sweating: no Nausea: no Palpitations:no Hyperventilation: no Panic attacks: no Agoraphobia: no  Obscessions/compulsions: no Depressed mood: no Anhedonia: no Weight changes: no Insomnia: yes hard to stay asleep  Hypersomnia: no Fatigue/loss of energy: no Feelings of worthlessness: no Feelings of guilt: no Impaired concentration/indecisiveness: no Suicidal ideations: no  Crying spells: no Recent Stressors/Life Changes: no   Relationship problems: no   Family stress: no     Financial stress: no    Job stress: no    Recent death/loss: no Depression screen [facility] 2/9 02/07/2022 12/13/2021 11/07/2021 08/07/2021 04/12/2021  Decreased Interest 0 0 0 0 0  Down, Depressed, Hopeless 0 0 0 0 0  PHQ - 2 Score 0 0 0 0 0  Altered sleeping 0 0 1 - 0  Tired, decreased energy 1 0 1 - 0  Change in appetite 0 0 0 -  0  Feeling bad or failure about yourself  0 0 0 - 0  Trouble concentrating 0 0 0 - 0  Moving slowly or fidgety/restless 1 - 0 - 0  Suicidal thoughts 0 - 0 - 0  PHQ-9 Score 2 0 2 - 0  Difficult doing work/chores Not difficult at all - - - Not difficult at all  Some recent data might be hidden    GAD 7 : Generalized Anxiety Score 02/07/2022 11/07/2021 04/12/2021 09/28/2020  Nervous, Anxious, on Edge 0 0 0 0  Control/stop worrying 0 0 0 0  Worry too much - different things 0 0 0 0  Trouble relaxing 0 0 0 0  Restless 1 0 0 0  Easily annoyed or irritable 0 0 0 0  Afraid - awful might happen 0 0 0 0  Total GAD 7 Score 1 0 0 0  Anxiety Difficulty Not difficult at all - Not difficult at all -   Relevant past medical, surgical, family and social history reviewed and updated as indicated. Interim medical history since our last  visit reviewed. Allergies and medications reviewed and updated.  Review of Systems  Constitutional:  Negative for activity change, diaphoresis, fatigue and fever.  Respiratory:  Negative for cough, chest tightness, shortness of breath and wheezing.   Cardiovascular:  Negative for chest pain, palpitations and leg swelling.  Gastrointestinal: Negative.   Endocrine: Negative for polydipsia, polyphagia and polyuria.  Neurological: Negative.   Psychiatric/Behavioral: Negative.     Per HPI unless specifically indicated above     Objective:    BP 110/75    Pulse 83    Temp 98.8 F (37.1 C) (Oral)    Ht 5\' 11"  (1.803 m)    Wt 251 lb 6.4 oz (114 kg)    SpO2 96%    BMI 35.06 kg/m   Wt Readings from Last 3 Encounters:  02/07/22 251 lb 6.4 oz (114 kg)  11/07/21 243 lb (110.2 kg)  10/10/21 255 lb (115.7 kg)    Physical Exam Vitals and nursing note reviewed.  Constitutional:      General: He is awake. He is not in acute distress.    Appearance: He is well-developed and well-groomed. He is morbidly obese. He is not ill-appearing.  HENT:     Head: Normocephalic and atraumatic.     Right Ear: Hearing normal. No drainage.     Left Ear: Hearing normal. No drainage.  Eyes:     General: Lids are normal.        Right eye: No discharge.        Left eye: No discharge.     Conjunctiva/sclera: Conjunctivae normal.     Pupils: Pupils are equal, round, and reactive to light.  Neck:     Thyroid: No thyromegaly.     Vascular: No carotid bruit.  Cardiovascular:     Rate and Rhythm: Normal rate and regular rhythm.     Heart sounds: Normal heart sounds, S1 normal and S2 normal. No murmur heard.   No gallop.  Pulmonary:     Effort: Pulmonary effort is normal. No accessory muscle usage or respiratory distress.     Breath sounds: Normal breath sounds.  Abdominal:     General: Bowel sounds are normal.     Palpations: Abdomen is soft.  Musculoskeletal:        General: Normal range of motion.      Cervical back: Normal range of motion and neck supple.  Right lower leg: No edema.     Left lower leg: No edema.  Skin:    General: Skin is warm and dry.     Capillary Refill: Capillary refill takes less than 2 seconds.  Neurological:     Mental Status: He is alert and oriented to person, place, and time.     Motor: Tremor present.  Psychiatric:        Attention and Perception: Attention normal.        Mood and Affect: Mood normal.        Speech: Speech normal.        Behavior: Behavior normal. Behavior is cooperative.        Thought Content: Thought content normal.   Results for orders placed or performed in visit on 11/07/21  Bayer DCA Hb A1c Waived  Result Value Ref Range   HB A1C (BAYER DCA - WAIVED) 5.6 4.8 - 5.6 %      Assessment & Plan:   Problem List Items Addressed This Visit       Cardiovascular and Mediastinum   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HCC)    Chronic, ongoing with A1c 5.7% today, remaining stable and well below goal. Urine ALB 30 today (February 2022), continue Lisinopril. Continue Trulicity 1.5 MG weekly and to continue Metformin, has been off Levemir for about 6 months now successfully.  Will consider increase in Trulicity to 3 MG weekly OR addition of SGLT2 for cardiac health to maintain A1c goal if elevations in future - with goal to maintain off insulin. CCM involved, Trulicity via them.  Return in 6 months for A1C check.        Relevant Medications   lisinopril (ZESTRIL) 5 MG tablet   metFORMIN (GLUCOPHAGE-XR) 500 MG 24 hr tablet   metoprolol succinate (TOPROL-XL) 100 MG 24 hr tablet   atorvastatin (LIPITOR) 20 MG tablet   Dulaglutide (TRULICITY) 1.5 VZ/5.6LO SOPN   Other Relevant Orders   Bayer DCA Hb A1c Waived   Microalbumin, Urine Waived     Endocrine   Hyperlipidemia associated with type 2 diabetes mellitus (HCC)    Chronic, ongoing.  Continue current medication regimen and adjust as needed.  Lipid panel today, recent  LDL < 70.      Relevant Medications   lisinopril (ZESTRIL) 5 MG tablet   metFORMIN (GLUCOPHAGE-XR) 500 MG 24 hr tablet   metoprolol succinate (TOPROL-XL) 100 MG 24 hr tablet   atorvastatin (LIPITOR) 20 MG tablet   Dulaglutide (TRULICITY) 1.5 VF/6.4PP SOPN   Other Relevant Orders   Bayer DCA Hb A1c Waived   Comprehensive metabolic panel   Lipid Panel w/o Chol/HDL Ratio   Type 2 diabetes mellitus with obesity (HCC) - Primary    Chronic, ongoing with A1c 5.7% today, remaining stable and well below goal. Urine ALB 30 today (February 2022), continue Lisinopril. Continue Trulicity 1.5 MG weekly and to continue Metformin, has been off Levemir for about 6 months now successfully.  Will consider increase in Trulicity to 3 MG weekly OR addition of SGLT2 for cardiac health to maintain A1c goal if elevations in future - with goal to maintain off insulin. CCM involved, Trulicity via them.  Return in 6 months for A1C check.        Relevant Medications   lisinopril (ZESTRIL) 5 MG tablet   metFORMIN (GLUCOPHAGE-XR) 500 MG 24 hr tablet   atorvastatin (LIPITOR) 20 MG tablet   Dulaglutide (TRULICITY) 1.5 IR/5.1OA SOPN   Other Relevant Orders  Bayer DCA Hb A1c Waived   Microalbumin, Urine Waived     Nervous and Auditory   Parkinson's disease ([place])    Followed by neurology, continue collaboration and current medication regimen as prescribed by them.  Recent note reviewed.       Relevant Medications   gabapentin (NEURONTIN) 100 MG capsule     Other   Chronic anxiety    Chronic, stable on Lexapro.  Continue low dose medication and monitor.  Denies SI/HI.      Relevant Medications   escitalopram (LEXAPRO) 10 MG tablet   Obesity    BMI 35.06 with T2DM, HTN/HLD.  Recommended eating smaller high protein, low fat meals more frequently and exercising 30 mins a day 5 times a week with a goal of 10-15lb weight loss in the next 3 months. Patient voiced their understanding and motivation to adhere to these  recommendations.       Relevant Medications   metFORMIN (GLUCOPHAGE-XR) 500 MG 24 hr tablet   Dulaglutide (TRULICITY) 1.5 WG/9.5AO SOPN   Other Visit Diagnoses     Pneumococcal vaccination given       PPSV23 given today.   Relevant Orders   Pneumococcal polysaccharide vaccine 23-valent greater than or equal to 2yo subcutaneous/IM        Follow up plan: Return in about 26 weeks (around 08/08/2022) for Annual physical with diabetes check.

## 2022-02-07 NOTE — Assessment & Plan Note (Signed)
Chronic, ongoing with A1c 5.7% today, remaining stable and well below goal. Urine ALB 30 today (February 2022), continue Lisinopril. Continue Trulicity 1.5 MG weekly and to continue Metformin, has been off Levemir for about 6 months now successfully.  Will consider increase in Trulicity to 3 MG weekly OR addition of SGLT2 for cardiac health to maintain A1c goal if elevations in future - with goal to maintain off insulin. CCM involved, Trulicity via them.  Return in 6 months for A1C check.

## 2022-02-07 NOTE — Assessment & Plan Note (Addendum)
Chronic, ongoing.  Continue current medication regimen and adjust as needed.  Lipid panel today, recent LDL < 70. 

## 2022-02-07 NOTE — Assessment & Plan Note (Signed)
Followed by neurology, continue collaboration and current medication regimen as prescribed by them.  Recent note reviewed.

## 2022-02-07 NOTE — Assessment & Plan Note (Signed)
BMI 35.06 with T2DM, HTN/HLD.  Recommended eating smaller high protein, low fat meals more frequently and exercising 30 mins a day 5 times a week with a goal of 10-15lb weight loss in the next 3 months. Patient voiced their understanding and motivation to adhere to these recommendations.

## 2022-02-08 ENCOUNTER — Ambulatory Visit: Payer: Medicare Other | Admitting: Dermatology

## 2022-02-08 LAB — COMPREHENSIVE METABOLIC PANEL
ALT: 25 IU/L (ref 0–44)
AST: 31 IU/L (ref 0–40)
Albumin/Globulin Ratio: 2.9 — ABNORMAL HIGH (ref 1.2–2.2)
Albumin: 4.9 g/dL — ABNORMAL HIGH (ref 3.8–4.8)
Alkaline Phosphatase: 90 IU/L (ref 44–121)
BUN/Creatinine Ratio: 21 (ref 10–24)
BUN: 24 mg/dL (ref 8–27)
Bilirubin Total: 0.7 mg/dL (ref 0.0–1.2)
CO2: 27 mmol/L (ref 20–29)
Calcium: 9.4 mg/dL (ref 8.6–10.2)
Chloride: 97 mmol/L (ref 96–106)
Creatinine, Ser: 1.17 mg/dL (ref 0.76–1.27)
Globulin, Total: 1.7 g/dL (ref 1.5–4.5)
Glucose: 121 mg/dL — ABNORMAL HIGH (ref 70–99)
Potassium: 3.8 mmol/L (ref 3.5–5.2)
Sodium: 140 mmol/L (ref 134–144)
Total Protein: 6.6 g/dL (ref 6.0–8.5)
eGFR: 69 mL/min/{1.73_m2} (ref >59)

## 2022-02-08 LAB — LIPID PANEL W/O CHOL/HDL RATIO
Cholesterol, Total: 163 mg/dL (ref 100–199)
HDL: 30 mg/dL — ABNORMAL LOW (ref >39)
LDL Chol Calc (NIH): 84 mg/dL (ref 0–99)
Triglycerides: 299 mg/dL — ABNORMAL HIGH (ref 0–149)
VLDL Cholesterol Cal: 49 mg/dL — ABNORMAL HIGH (ref 5–40)

## 2022-02-08 NOTE — Progress Notes (Signed)
Contacted via [place] afternoon Maceo, your labs have returned.  Kidney function, creatinine and eGFR, remains normal, as is liver function, AST and ALT.  Cholesterol labs show stable LDL, but triglycerides remain mildly elevated.  Continue statin and focus heavily on diet at home, adding more fish and less red meat.  Any questions? Keep being amazing!!  Thank you for allowing me to participate in your care.  I appreciate you. Kindest regards, Sajid Ruppert

## 2022-02-14 ENCOUNTER — Ambulatory Visit: Payer: Medicare Other | Admitting: Dermatology

## 2022-02-14 ENCOUNTER — Other Ambulatory Visit: Payer: Self-pay

## 2022-02-14 DIAGNOSIS — L578 Other skin changes due to chronic exposure to nonionizing radiation: Secondary | ICD-10-CM

## 2022-02-14 DIAGNOSIS — L570 Actinic keratosis: Secondary | ICD-10-CM

## 2022-02-14 NOTE — Progress Notes (Signed)
? ?  Follow-Up Visit ?  ?Subjective  ?Brian Frederick is a 66 y.o. male who presents for the following: Actinic Keratosis (Face, scalp, LN2, 5FU/Calcipotriene cream to scalp with good reaction). ?The patient has spots, moles and lesions to be evaluated, some may be new or changing and the patient has concerns that these could be cancer. ? ?The following portions of the chart were reviewed this encounter and updated as appropriate:  ? Tobacco  Allergies  Meds  Problems  Med Hx  Surg Hx  Fam Hx   ?  ?Review of Systems:  No other skin or systemic complaints except as noted in HPI or Assessment and Plan. ? ?Objective  ?Well appearing patient in no apparent distress; mood and affect are within normal limits. ? ?A focused examination was performed including face, scalp. Relevant physical exam findings are noted in the Assessment and Plan. ? ?L cheek x 3 (3) ?Pink scaly macules ? ? ?Assessment & Plan  ? ?Actinic Damage - Severe, confluent actinic changes with pre-cancerous actinic keratoses  ?- Severe, chronic, not at goal, secondary to cumulative UV radiation exposure over time ?- diffuse scaly erythematous macules and papules with underlying dyspigmentation ?- Discussed Prescription "Field Treatment" for Severe, Chronic Confluent Actinic Changes with Pre-Cancerous Actinic Keratoses ?Field treatment involves treatment of an entire area of skin that has confluent Actinic Changes (Sun/ Ultraviolet light damage) and PreCancerous Actinic Keratoses by method of PhotoDynamic Therapy (PDT) and/or prescription Topical Chemotherapy agents such as 5-fluorouracil, 5-fluorouracil/calcipotriene, and/or imiquimod.  The purpose is to decrease the number of clinically evident and subclinical PreCancerous lesions to prevent progression to development of skin cancer by chemically destroying early precancer changes that may or may not be visible.  It has been shown to reduce the risk of developing skin cancer in the treated area. As a  result of treatment, redness, scaling, crusting, and open sores may occur during treatment course. One or more than one of these methods may be used and may have to be used several times to control, suppress and eliminate the PreCancerous changes. Discussed treatment course, expected reaction, and possible side effects. ?- Recommend daily broad spectrum sunscreen SPF 30+ to sun-exposed areas, reapply every 2 hours as needed.  ?- Staying in the shade or wearing long sleeves, sun glasses (UVA+UVB protection) and wide brim hats (4-inch brim around the entire circumference of the hat) are also recommended. ?- Call for new or changing lesions.  ?-- Start 5-fluorouracil/calcipotriene cream twice a day for 7 days to affected areas including scalp.  Patient has prescription at home from previous treatment. ? ?AK (actinic keratosis) (3) ?L cheek x 3 ? ?Destruction of lesion - L cheek x 3 ?Complexity: simple   ?Destruction method: cryotherapy   ?Informed consent: discussed and consent obtained   ?Timeout:  patient name, date of birth, surgical site, and procedure verified ?Lesion destroyed using liquid nitrogen: Yes   ?Region frozen until ice ball extended beyond lesion: Yes   ?Outcome: patient tolerated procedure well with no complications   ?Post-procedure details: wound care instructions given   ? ?Related Medications ?fluorouracil (EFUDEX) 5 % cream ?Apply topically 2 (two) times daily. For 1 week to temples and ears ? ? ?Return in about 6 months (around 08/17/2022) for TBSE, AK f/u. ? ?I, Othelia Pulling, RMA, am acting as scribe for Sarina Ser, MD . ?Documentation: I have reviewed the above documentation for accuracy and completeness, and I agree with the above. ? ?Sarina Ser, MD ? ?

## 2022-02-14 NOTE — Patient Instructions (Addendum)
- In a month start 5-fluorouracil/calcipotriene cream twice a day for 7 days to affected areas including scalp.  ? ?5-fluorouracil/calcipotriene cream is is a type of field treatment used to treat precancers, thin skin cancers, and areas of sun damage. Expected reaction includes irritation and mild inflammation potentially progressing to more severe inflammation including redness, scaling, crusting and open sores/erosions.  If too much irritation occurs, ensure application of only a thin layer and decrease frequency of use to achieve a tolerable level of inflammation. Recommend applying Vaseline ointment to open sores as needed.  Minimize sun exposure while under treatment. Recommend daily broad spectrum sunscreen SPF 30+ to sun-exposed areas, reapply every 2 hours as needed.  ? ?   ? ? ? ?If You Need Anything After Your Visit ? ?If you have any questions or concerns for your doctor, please call our main line at [phone] and press option 4 to reach your doctor's medical assistant. If no one answers, please leave a voicemail as directed and we will return your call as soon as possible. Messages left after 4 pm will be answered the following business day.  ? ?You may also send [place] a message via MyChart. We typically respond to MyChart messages within 1-2 business days. ? ?For prescription refills, please ask your pharmacy to contact our office. Our fax number is [phone]. ? ?If you have an urgent issue when the clinic is closed that cannot wait until the next business day, you can page your doctor at the number below.   ? ?Please note that while we do our best to be available for urgent issues outside of office hours, we are not available 24/7.  ? ?If you have an urgent issue and are unable to reach [place], you may choose to seek medical care at your doctor's office, retail clinic, urgent care center, or emergency room. ? ?If you have a medical emergency, please immediately call 911 or go to the emergency  department. ? ?Pager Numbers ? ?- Dr. Nehemiah Massed: [phone] ? ?- Dr. Laurence Ferrari: [phone] ? ?- Dr. Nicole Kindred: [phone] ? ?In the event of inclement weather, please call our main line at [phone] for an update on the status of any delays or closures. ? ?Dermatology Medication Tips: ?Please keep the boxes that topical medications come in in order to help keep track of the instructions about where and how to use these. Pharmacies typically print the medication instructions only on the boxes and not directly on the medication tubes.  ? ?If your medication is too expensive, please contact our office at [phone] option 4 or send [place] a message through [place].  ? ?We are unable to tell what your co-pay for medications will be in advance as this is different depending on your insurance coverage. However, we may be able to find a substitute medication at lower cost or fill out paperwork to get insurance to cover a needed medication.  ? ?If a prior authorization is required to get your medication covered by your insurance company, please allow [place] 1-2 business days to complete this process. ? ?Drug prices often vary depending on where the prescription is filled and some pharmacies may offer cheaper prices. ? ?The website www.goodrx.com contains coupons for medications through different pharmacies. The prices here do not account for what the cost may be with help from insurance (it may be cheaper with your insurance), but the website can give you the price if you did not use any insurance.  ?- You can print the  associated coupon and take it with your prescription to the pharmacy.  ?- You may also stop by our office during regular business hours and pick up a GoodRx coupon card.  ?- If you need your prescription sent electronically to a different pharmacy, notify our office through [facility] or by phone at [phone] option 4. ? ? ? ? ?Si Usted Necesita Algo Despu?s de Su Visita ? ?Tambi?n puede enviarnos un  mensaje a trav?s de MyChart. Por lo general respondemos a los mensajes de MyChart en el transcurso de 1 a 2 d?as h?biles. ? ?Para renovar recetas, por favor pida a su farmacia que se ponga en contacto con nuestra oficina. Nuestro n?mero de fax es el [phone]. ? ?Si tiene un asunto urgente cuando la cl?nica est? cerrada y que no puede esperar hasta el siguiente d?a h?bil, puede llamar/localizar a su doctor(a) al n?mero que aparece a continuaci?n.  ? ?Por favor, tenga en cuenta que aunque hacemos todo lo posible para estar disponibles para asuntos urgentes fuera del horario de oficina, no estamos disponibles las 24 horas del d?a, los 7 d?as de la semana.  ? ?Si tiene un problema urgente y no puede comunicarse con nosotros, puede optar por buscar atenci?n m?dica  en el consultorio de su doctor(a), en una cl?nica privada, en un centro de atenci?n urgente o en una sala de emergencias. ? ?Si tiene Engineer, maintenance (IT) m?dica, por favor llame inmediatamente al 911 o vaya a la sala de emergencias. ? ?N?meros de b?per ? ?- Dr. Nehemiah Massed: [phone] ? ?- Dra. Moye: [phone] ? ?- Dra. Nicole Kindred: [phone] ? ?En caso de inclemencias del tiempo, por favor llame a nuestra l?nea principal al [phone] para una actualizaci?n sobre el estado de cualquier retraso o cierre. ? ?Consejos para la medicaci?n en dermatolog?a: ?Por favor, guarde las cajas en las que vienen los medicamentos de uso t?pico para ayudarle a seguir las instrucciones sobre d?nde y c?mo usarlos. Las farmacias generalmente imprimen las instrucciones del medicamento s?lo en las cajas y no directamente en los tubos del [place].  ? ?Si su medicamento es muy caro, por favor, p?ngase en contacto con Zigmund Daniel llamando al [phone] y presione la opci?n 4 o env?enos un mensaje a trav?s de MyChart.  ? ?No podemos decirle cu?l ser? su copago por los medicamentos por adelantado ya que esto es diferente dependiendo de la cobertura de su seguro. Sin embargo,  es posible que podamos encontrar un medicamento sustituto a Electrical engineer un formulario para que el seguro cubra el medicamento que se considera necesario.  ? ?Si se requiere Ardelia Mems autorizaci?n previa para que su compa??a de seguros [place] su medicamento, por favor perm?tanos de 1 a 2 d?as h?biles para completar este proceso. ? ?Los precios de los medicamentos var?an con frecuencia dependiendo del Environmental consultant de d?nde se surte la receta y alguna farmacias pueden ofrecer precios m?s baratos. ? ?El sitio web www.goodrx.com tiene cupones para medicamentos de Airline pilot. Los precios aqu? no tienen en cuenta lo que podr?a costar con la ayuda del seguro (puede ser m?s barato con su seguro), pero el sitio web puede darle el precio si no utiliz? ning?n seguro.  ?- Puede imprimir el cup?n correspondiente y llevarlo con su receta a la farmacia.  ?- Tambi?n puede pasar por nuestra oficina durante el horario de atenci?n regular y recoger una tarjeta de cupones de GoodRx.  ?- Si necesita que su receta se env?e electr?nicamente a Chiropodist, informe a nuestra oficina a  trav?s de MyChart [place] o por tel?fono llamando al [phone] y presione la opci?n 4.  ?

## 2022-02-16 ENCOUNTER — Encounter: Payer: Self-pay | Admitting: Dermatology

## 2022-02-19 ENCOUNTER — Ambulatory Visit (INDEPENDENT_AMBULATORY_CARE_PROVIDER_SITE_OTHER): Payer: Medicare Other

## 2022-02-19 ENCOUNTER — Telehealth: Payer: Self-pay

## 2022-02-19 DIAGNOSIS — E1159 Type 2 diabetes mellitus with other circulatory complications: Secondary | ICD-10-CM

## 2022-02-19 DIAGNOSIS — E1169 Type 2 diabetes mellitus with other specified complication: Secondary | ICD-10-CM

## 2022-02-19 NOTE — Progress Notes (Signed)
Chronic Care Management Pharmacy Note  02/19/2022 Name:  [name] MRN:  [id number] DOB:  [birth year]  Summary: Brian Frederick 1.5 mg - approved through 2023, early enrollment for 2024 starts October 15th 2023. 100-130 FBG. Starting to use stationary bike more often, diet - main issue is portions  Consider changing metformin to 500 mg BID to reflect current use  Subjective: Brian Frederick is an 66 y.o. year old male who is a primary patient of Cannady, Barbaraann Faster, NP.  The CCM team was consulted for assistance with disease management and care coordination needs.    Engaged with patient by telephone for follow up visit in response to provider referral for pharmacy case management and/or care coordination services.   Consent to Services:  The patient was given the following information about Chronic Care Management services today, agreed to services, and gave verbal consent. See initial CCM appt for details.   Patient Care Team: [name], NP as PCP - General (Nurse Practitioner) Tat, Eustace Quail, DO as Consulting Physician (Neurology) [name], [facility] (Pharmacist)  Hospital visits: None in previous 6 months  Objective:  Lab Results  Component Value Date   CREATININE 1.17 02/07/2022   CREATININE 1.01 08/07/2021   CREATININE 1.21 01/11/2021    Lab Results  Component Value Date   HGBA1C 5.7 (H) 02/07/2022   HGBA1C 5.6 11/07/2021   HGBA1C 5.9 08/07/2021   Last diabetic Eye exam:  Lab Results  Component Value Date/Time   HMDIABEYEEXA No Retinopathy 08/25/2021 12:00 AM    Last diabetic Foot exam: No results found for: HMDIABFOOTEX      Component Value Date/Time   CHOL 163 02/07/2022 0959   CHOL 144 08/07/2021 1442   CHOL 110 01/11/2021 0848   CHOL 120 02/15/2020 0804   CHOL 126 11/18/2019 0817   CHOL 123 05/12/2019 0830   TRIG 299 (H) 02/07/2022 0959   TRIG 233 (H) 08/07/2021 1442   TRIG 158 (H) 01/11/2021 0848   TRIG 156 (H) 02/15/2020 0804   TRIG 174 (H)  11/18/2019 0817   TRIG 175 (H) 05/12/2019 0830   HDL 30 (L) 02/07/2022 0959   HDL 33 (L) 08/07/2021 1442   HDL 30 (L) 01/11/2021 0848   VLDL 31 (H) 02/15/2020 0804   LDLCALC 84 02/07/2022 0959   LDLCALC 73 08/07/2021 1442   LDLCALC 53 01/11/2021 0848    Hepatic Function Latest Ref Rng & Units 02/07/2022 08/07/2021 06/10/2020  Total Protein 6.0 - 8.5 g/dL 6.6 6.5 6.9  Albumin 3.8 - 4.8 g/dL 4.9(H) 4.5 4.6  AST 0 - 40 IU/L _0 ALT 0 - 44 IU/L _1 Alk Phosphatase 44 - 121 IU/L 90 96 104  Total Bilirubin 0.0 - 1.2 mg/dL 0.7 0.4 0.3    Lab Results  Component Value Date/Time   TSH 2.050 08/07/2021 02:42 PM   TSH 2.480 06/10/2020 08:08 AM    CBC Latest Ref Rng & Units 08/07/2021 06/10/2020 05/02/2018  WBC 3.4 - 10.8 x10E3/uL 7.2 8.1 6.0  Hemoglobin 13.0 - 17.7 g/dL 14.4 14.5 14.3  Hematocrit 37.5 - 51.0 % 42.7 44.6 42.1  Platelets 150 - 450 x10E3/uL 160 169 179    Lab Results  Component Value Date/Time   VD25OH 35.7 09/28/2020 08:54 AM    Clinical ASCVD:  The 10-year ASCVD risk score (Arnett DK, et al., 2019) is: 25.7%   Values used to calculate the score:     Age: 75 years  Sex: Male     Is Non-Hispanic African American: No     Diabetic: Yes     Tobacco smoker: No     Systolic Blood Pressure: 051 mmHg     Is BP treated: Yes     HDL Cholesterol: 30 mg/dL     Total Cholesterol: 163 mg/dL   Social History   Tobacco Use  Smoking Status Never  Smokeless Tobacco Never   BP Readings from Last 3 Encounters:  02/07/22 110/75  11/07/21 124/83  10/10/21 135/81   Pulse Readings from Last 3 Encounters:  02/07/22 83  11/07/21 77  10/10/21 79   Wt Readings from Last 3 Encounters:  02/07/22 251 lb 6.4 oz (114 kg)  11/07/21 243 lb (110.2 kg)  10/10/21 255 lb (115.7 kg)   BMI Readings from Last 3 Encounters:  02/07/22 35.06 kg/m  11/07/21 33.89 kg/m  10/10/21 35.57 kg/m    Assessment: Review of patient past medical history, allergies, medications,  health status, including review of consultants reports, laboratory and other test data, was performed as part of comprehensive evaluation and provision of chronic care management services.   SDOH:  (Social Determinants of Health) assessments and interventions performed: Yes   CCM Care Plan  Allergies  Allergen Reactions   Amoxil [Amoxicillin] Swelling    Did it involve swelling of the face/tongue/throat, SOB, or low BP? No Did it involve sudden or severe rash/hives, skin peeling, or any reaction on the inside of your mouth or nose? No Did you need to seek medical attention at a hospital or doctor's office? No When did it last happen?      15 years ago If all above answers are "NO", may proceed with cephalosporin use.    Penicillin G Benzathine Swelling    Did it involve swelling of the face/tongue/throat, SOB, or low BP? No Did it involve sudden or severe rash/hives, skin peeling, or any reaction on the inside of your mouth or nose? No Did you need to seek medical attention at a hospital or doctor's office? No When did it last happen?      15 years ago If all above answers are "NO", may proceed with cephalosporin use.     Medications Reviewed Today     Reviewed by [name], [facility] (Pharmacist) on 02/19/22 at [place] List Status: <None>   Medication Order Taking? Sig Documenting Provider Last Dose Status Informant  aspirin 81 MG tablet [id number] No Take 81 mg by mouth daily. [provider] Taking Active Self  atorvastatin (LIPITOR) 20 MG tablet [id number]  Take 1 tablet (20 mg total) by mouth daily. [name], NP  Active   BD PEN NEEDLE NANO 2ND GEN 32G X 4 MM MISC [id number] No USE 1 PEN NEEDLE EVERY MORNING Cannady, Jolene T, NP Taking Active   blood glucose meter kit and supplies KIT [id number] No Dispense based on patient and insurance preference. Use up to four times daily as directed. (FOR ICD-9 250.00, 250.01). [name] T, NP Taking Active Self   carbidopa-levodopa (SINEMET IR) 25-100 MG tablet [id number] No 2 at 8am/2 at noon/1 at 4pm Tat, Eustace Quail, DO Taking Active   Dulaglutide (TRULICITY) 1.5 KF/2.7MD SOPN [id number]  ADMINISTER 1.5 MG UNDER THE SKIN 1 TIME A WEEK Cannady, Jolene T, NP  Active   escitalopram (LEXAPRO) 10 MG tablet [id number]  Take 1 tablet (10 mg total) by mouth daily. Cannady, Henrine Screws T, NP  Active   fluorouracil (EFUDEX) 5 % cream  [id number] No Apply topically 2 (two) times daily. For 1 week to temples and ears Ralene Bathe, MD Taking Active   gabapentin (NEURONTIN) 100 MG capsule [id number]  TAKE 1 CAPSULE(100 MG) BY MOUTH TWICE A DAY. [name] T, NP  Active   lansoprazole (PREVACID) 30 MG capsule [id number] No TAKE 1 CAPSULE BY MOUTH DAILY AT 12 NOON Cannady, Jolene T, NP Taking Active   lidocaine (XYLOCAINE) 2 % solution [id number] No Use as directed 15 mLs in the mouth or throat as needed for mouth pain. [name] T, NP Taking Active   lisinopril (ZESTRIL) 5 MG tablet [id number]  TAKE 1 TABLET(5 MG) BY MOUTH DAILY Cannady, Jolene T, NP  Active   meloxicam (MOBIC) 15 MG tablet [id number] No Mobic 15 mg tablet  Take 1 tablet every day by oral route as directed. [provider] Taking Active   metFORMIN (GLUCOPHAGE-XR) 500 MG 24 hr tablet [id number]  TAKE 2 TABLETS(1000 MG) BY MOUTH TWICE DAILY. [name] T, NP  Active   metoprolol succinate (TOPROL-XL) 100 MG 24 hr tablet [id number]  TAKE 1 TABLET BY MOUTH EVERY DAY WITH FOOD OR IMMEDIATELY AFTER A MEAL Cannady, Jolene T, NP  Active   Multiple Vitamin (MULTIVITAMIN) tablet [id number] No Take 1 tablet by mouth daily. [provider] Taking Active Self  ONETOUCH VERIO test strip [id number] No USE TO TEST BLOOD SUGAR UP TO FOUR TIMES DAILY AS DIRECTED Cannady, Jolene T, NP Taking Active   sildenafil (VIAGRA) 100 MG tablet [id number] No Take 0.5-1 tablets (50-100 mg total) by mouth daily as needed for erectile dysfunction. [name] T, NP  Taking Active   tiZANidine (ZANAFLEX) 4 MG tablet [id number] No tizanidine 4 mg tablet  Take 1 tablet(s)at bedtime as needed [provider] Taking Active   traMADol (ULTRAM) 50 MG tablet [id number] No Ultram 50 mg tablet  Take 1 tablet every 6 hours by oral route. [provider] Taking Active   trihexyphenidyl (ARTANE) 2 MG tablet [id number] No TAKE 1 TABLET(2 MG) BY MOUTH THREE TIMES DAILY WITH MEALS Tat, Eustace Quail, DO Taking Active             Patient Active Problem List   Diagnosis Date Noted   Vitamin B12 deficiency 09/29/2020   History of cardiac arrhythmia 06/03/2019   Type 2 diabetes mellitus with obesity ([place]) 09/05/2018   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[place]) 73/42/8768   Chronic anxiety 05/02/2018   History of shoulder surgery 05/02/2018   Parkinson's disease ([place]) 01/16/2018   Benign neoplasm of descending colon    ED (erectile dysfunction) 03/27/2017   Hypogonadism in male 09/22/2015   Basal cell carcinoma 09/22/2015   Osteoarthritis of knee 09/22/2015   Benign prostatic hyperplasia 09/22/2015   Obesity 09/22/2015   Hyperlipidemia associated with type 2 diabetes mellitus ([place]) 09/22/2015   History of open heart surgery 10/06/2014   GERD (gastroesophageal reflux disease) 10/22/2013    Immunization History  Administered Date(s) Administered   Fluad Quad(high Dose 65+) 11/07/2021   Influenza,inj,Quad PF,6+ Mos 09/23/2015, 09/25/2016, 09/29/2017, 09/28/2018, 09/21/2019, 09/28/2020   Influenza-Unspecified 09/29/2017, 09/28/2018   PFIZER(Purple Top)SARS-COV-2 Vaccination 03/16/2020, 04/06/2020, 10/21/2020   Pneumococcal Conjugate-13 01/11/2021   Pneumococcal Polysaccharide-23 08/26/2013, 02/07/2022   Td 08/19/2002   Tdap 11/05/2012   Zoster Recombinat (Shingrix) 09/29/2017, 12/08/2017   Zoster, Live 03/23/2016    Conditions to be addressed/monitored: HLD HTN DMII Morbid obesity   Care Plan : ccm pharmacy care plan  Updates  made by Leroy Sea,  Edison Nasuti, [facility] since 02/19/2022 12:00 AM     Problem: HLD HTN DMII CAD Morbid obesity   Priority: High     Long-Range Goal: disease management   Start Date: 08/22/2021  Priority: High  Note:    Current Barriers:  Unable to independently afford treatment regimen  Pharmacist Clinical Goal(s):  Patient will verbalize ability to afford treatment regimen through collaboration with PharmD and provider.   Interventions: 1:1 collaboration with [name], NP regarding development and update of comprehensive plan of care as evidenced by provider attestation and co-signature Inter-disciplinary care team collaboration (see longitudinal plan of care) Comprehensive medication review performed; medication list updated in electronic medical record  Hyperlipidemia: (LDL goal < 70) -No ideally Controlled -Reviewed adherence concerns - had changed to BIW-TIW use of atorvastatin d/t dizziness. Will try back on daily use and check on tolerability April 2023 - could consider rosuvastatin as next option and lower chance of crossing BBB -Current treatment: Atorvastatin 20 mg once daily -Side effect review- no problems noted -Educated on Cholesterol goals;  Benefits of statin for ASCVD risk reduction; -Recommended to continue current medication  Diabetes (A1c goal <7%) -Controlled -Last a1c 5.7%01/2022 -Current medications: Trulicity 1.5 mg once weekly  Metformin 500 mg XR tablets - two tabs twice daily with meals (3/6 cut use to 500 mg BID) -Medications previously tried: Levemir -Current home glucose readings fasting glucose: 100-130s -Denies hypoglycemic/hyperglycemic symptoms -Educated on A1c and blood sugar goals; Diet - continues to practice smaller portions, has lost 40 lb over past yr Exercise - going to try using stationary bike more often -Counseled to check feet daily and get yearly eye exams -Recommended to continue current medication PAP for trulicity approved through  2023 1 month HC call to check on tolerability, PAP call October 2023        Patient Goals/Self-Care Activities Patient will:  - take medications as prescribed check glucose daily, document, and provide at future appointments collaborate with provider on medication access solutions  Medication Assistance: Application for Trulicity approved through 2023. See plan of care for additional detail.  Patient's preferred pharmacy is:  [id number] [place], [place] - [place] AT [facility] [address] [place] [address] Phone: [phone] Fax: [phone]  [place], [address] [place] [address] Phone: [phone] Fax: [phone]   Pt endorses 100% compliance  Follow Up:  Patient agrees to Care Plan and Follow-up. Plan: HC trulicity PAP + 21/1155 DM call/PAP f/u. CPP 6 month telephone visit   Future Appointments  Date Time Provider [place]  04/12/2022 10:45 AM Tat, Eustace Quail, DO [facility] None  08/08/2022  9:40 AM [name], NP [facility]  08/23/2022 11:00 AM Ralene Bathe, MD [facility] None   [name], PharmD, CPP Clinical Pharmacist Practitioner  [place]  [phone]

## 2022-02-19 NOTE — Patient Instructions (Signed)
Brian Frederick, ? ?Thank you for talking with me today. I have included our care plan/goals in the following pages.  ? ?Please review and call me at [phone] with any questions. ? ?Thanks! ?Edison Nasuti  ? ?[name], PharmD ?Clinical Pharmacist  ?[phone] ? ?Care Plan : ccm pharmacy care plan  ?Updates made by [name], [facility] since 02/19/2022 12:00 AM  ?  ? ?Problem: HLD HTN DMII CAD Morbid obesity   ?Priority: High  ?  ? ?Long-Range Goal: disease management   ?Start Date: 08/22/2021  ?Priority: High  ?Note:   ? ?Current Barriers:  ?Unable to independently afford treatment regimen ? ?Pharmacist Clinical Goal(s):  ?Patient will verbalize ability to afford treatment regimen through collaboration with PharmD and provider.  ? ?Interventions: ?1:1 collaboration with [name], NP regarding development and update of comprehensive plan of care as evidenced by provider attestation and co-signature ?Inter-disciplinary care team collaboration (see longitudinal plan of care) ?Comprehensive medication review performed; medication list updated in electronic medical record ? ?Hyperlipidemia: (LDL goal < 70) ?-No ideally Controlled ?-Reviewed adherence concerns - had changed to BIW-TIW use of atorvastatin d/t dizziness. Will try back on daily use and check on tolerability April 2023 - could consider rosuvastatin as next option and lower chance of crossing BBB ?-Current treatment: ?Atorvastatin 20 mg once daily ?-Side effect review- no problems noted ?-Educated on Cholesterol goals;  ?Benefits of statin for ASCVD risk reduction; ?-Recommended to continue current medication ? ?Diabetes (A1c goal <7%) ?-Controlled ?-Last a1c 5.7%01/2022 ?-Current medications: ?Trulicity 1.5 mg once weekly  ?Metformin 500 mg XR tablets - two tabs twice daily with meals (3/6 cut use to 500 mg BID) ?-Medications previously tried: Levemir ?-Current home glucose readings ?fasting glucose: 100-130s ?-Denies hypoglycemic/hyperglycemic  symptoms ?-Educated on A1c and blood sugar goals; ?Diet - continues to practice smaller portions, has lost 40 lb over past yr ?Exercise - going to try using stationary bike more often ?-Counseled to check feet daily and get yearly eye exams ?-Recommended to continue current medication ?PAP for trulicity approved through 2023 ?1 month HC call to check on tolerability, PAP call October 2023  ?  ? ? ?The patient verbalized understanding of instructions provided today and agreed to receive a MyChart copy of patient instruction and/or educational materials. ?Telephone follow up appointment with pharmacy team member scheduled for: See next appointment with "Care Management Staff" under "What's Next" below.   ?

## 2022-02-21 NOTE — Progress Notes (Deleted)
? ? ?  Chronic Care Management ?Pharmacy Assistant  ? ?Name: Brian Frederick  MRN: [id number] DOB: [birth year] ? ? ?Reason for Encounter: Patient Assistance application Trulicity ?  ? ?Medications: ?Outpatient Encounter Medications as of 02/19/2022  ?Medication Sig  ? aspirin 81 MG tablet Take 81 mg by mouth daily.  ? atorvastatin (LIPITOR) 20 MG tablet Take 1 tablet (20 mg total) by mouth daily.  ? BD PEN NEEDLE NANO 2ND GEN 32G X 4 MM MISC USE 1 PEN NEEDLE EVERY MORNING  ? blood glucose meter kit and supplies KIT Dispense based on patient and insurance preference. Use up to four times daily as directed. (FOR ICD-9 250.00, 250.01).  ? carbidopa-levodopa (SINEMET IR) 25-100 MG tablet 2 at 8am/2 at noon/1 at 4pm  ? Dulaglutide (TRULICITY) 1.5 YI/0.1KP SOPN ADMINISTER 1.5 MG UNDER THE SKIN 1 TIME A WEEK  ? escitalopram (LEXAPRO) 10 MG tablet Take 1 tablet (10 mg total) by mouth daily.  ? fluorouracil (EFUDEX) 5 % cream Apply topically 2 (two) times daily. For 1 week to temples and ears  ? gabapentin (NEURONTIN) 100 MG capsule TAKE 1 CAPSULE(100 MG) BY MOUTH TWICE A DAY.  ? lansoprazole (PREVACID) 30 MG capsule TAKE 1 CAPSULE BY MOUTH DAILY AT 12 NOON  ? lidocaine (XYLOCAINE) 2 % solution Use as directed 15 mLs in the mouth or throat as needed for mouth pain.  ? lisinopril (ZESTRIL) 5 MG tablet TAKE 1 TABLET(5 MG) BY MOUTH DAILY  ? meloxicam (MOBIC) 15 MG tablet Mobic 15 mg tablet ? Take 1 tablet every day by oral route as directed.  ? metFORMIN (GLUCOPHAGE-XR) 500 MG 24 hr tablet TAKE 2 TABLETS(1000 MG) BY MOUTH TWICE DAILY.  ? metoprolol succinate (TOPROL-XL) 100 MG 24 hr tablet TAKE 1 TABLET BY MOUTH EVERY DAY WITH FOOD OR IMMEDIATELY AFTER A MEAL  ? Multiple Vitamin (MULTIVITAMIN) tablet Take 1 tablet by mouth daily.  ? ONETOUCH VERIO test strip USE TO TEST BLOOD SUGAR UP TO FOUR TIMES DAILY AS DIRECTED  ? sildenafil (VIAGRA) 100 MG tablet Take 0.5-1 tablets (50-100 mg total) by mouth daily as needed for erectile  dysfunction.  ? tiZANidine (ZANAFLEX) 4 MG tablet tizanidine 4 mg tablet ? Take 1 tablet(s)at bedtime as needed  ? traMADol (ULTRAM) 50 MG tablet Ultram 50 mg tablet ? Take 1 tablet every 6 hours by oral route.  ? trihexyphenidyl (ARTANE) 2 MG tablet TAKE 1 TABLET(2 MG) BY MOUTH THREE TIMES DAILY WITH MEALS  ? ?No facility-administered encounter medications on file as of 02/19/2022.  ? ? ?Had to send in a new patient assistance application for patient due to the application that was sent back in November had expired already for 90 days. I have mailed and prefilled the application to the patient. I left clear instructions on what to do for the patient since I couldn't reach him in order to explain the directions via phone. ? ?Corrie Mckusick, RMA ?Health Concierge ? ?

## 2022-02-21 NOTE — Progress Notes (Signed)
Error

## 2022-03-01 ENCOUNTER — Other Ambulatory Visit: Payer: Self-pay | Admitting: Neurology

## 2022-03-16 DIAGNOSIS — E1169 Type 2 diabetes mellitus with other specified complication: Secondary | ICD-10-CM

## 2022-03-16 DIAGNOSIS — Z7984 Long term (current) use of oral hypoglycemic drugs: Secondary | ICD-10-CM

## 2022-03-16 DIAGNOSIS — E785 Hyperlipidemia, unspecified: Secondary | ICD-10-CM | POA: Diagnosis not present
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
## 2022-04-12 ENCOUNTER — Encounter: Payer: Self-pay | Admitting: Neurology

## 2022-04-12 ENCOUNTER — Ambulatory Visit: Payer: Medicare Other | Admitting: Neurology

## 2022-04-12 DIAGNOSIS — G20 Parkinson's disease: Secondary | ICD-10-CM

## 2022-04-12 MED ORDER — TRIHEXYPHENIDYL HCL 2 MG PO TABS: ORAL_TABLET | ORAL | 1 refills | Status: DC

## 2022-04-12 NOTE — Patient Instructions (Signed)
Local and Online Resources for Power over Parkinson's Group ?April 2023 ? ?LOCAL [place] PARKINSON'S GROUPS  ?Power over Parkinson's Group:   ?Power Over Parkinson's Patient Education Group will be Wednesday, April 12th-*Hybrid meting*- in person at [facility] location and via [facility] at 2:00 pm.   ?Upcoming Power over Parkinson's Meetings:  2nd Wednesdays of the month at 2 pm:  April 12th, May 10th ?Contact Amy Marriott at [email or web address] if interested in participating in this group ?Parkinson's Care Partners Group:    3rd Mondays, Contact Misty Paladino ?Atypical Parkinsonian Patient Group:   4th Wednesdays, Contact Misty Paladino ?If you are interested in participating in these groups with Misty, please contact her directly for how to join those meetings.  Her contact information is [email or web address].   ? ?LOCAL EVENTS AND NEW OFFERINGS ?Dine out at [employer].  Celebrate Parkinson's disease Awareness Month and Support the Parkinson's Movement Disorder Fund.   Wednesday, April 19th 4-6 pm at [place], [employer].  (Give receipt to cashier and 20% will be donated) ?[place]!  Play [place]!  Join [place] for home game for a fun evening to bring awareness of Parkinson's and raise funds for our Movement Disorder Funds. April 28th 6:30 pm [address] To purchase tickets:  [email or web address] ?Parkinson's T-shirts for sale!  Designed by a local group member, with funds going to [place].  $20.00  Contact Misty to purchase (see email above) ?New PWR! Moves [employer] Instructor-Led Class offering at [employer]! Starting Wednesdays 1-2 pm, starting April 12th.   Contact Bryson Dames, Acupuncturist at [employer].  [email or web address] [email or web address] ? ?ONLINE EDUCATION AND SUPPORT ?[place]:  www.[email or web address] ?PD Health at Home continues:  Mindfulness Mondays, Expert Briefing Tuesdays,  Wellness Wednesdays, Take Time Thursdays, Fitness Fridays  ?Upcoming Education:  ?Freezing and Fall Prevention in Parkinson's.  Wednesday, April 12th at 1:00 pm ?Understanding Gene and Cell-Based Therapies in Parkinson's.  Wednesday, May 10th at 1:00 pm ?Register for expert briefings (webinars) at [email or web address] ?Please check out their website to sign up for emails and see their full online offerings ? ? ?[place]:  [email or web address]  ?Third Thursday Webinars:  On the third Thursday of every month at 12 p.m. ET, join our free live webinars to learn about various aspects of living with Parkinson's disease and our work to speed medical breakthroughs. ?Upcoming Webinar:  Brainwaves:  The Potential of Focused Ultrasound and Deep Brain Stimulation in PD.  Thursday, April 20th at  12 noon. ?Check out additional information on their website to see their full online offerings ? ?[place]:  [email or web address] ?Upcoming Webinar:   Stay tuned ?Webinar Series:  Living with Parkinson's Meetup.   Third Thursdays each month, 3 pm ?Care Partner Monthly Meetup.  With Robin Searing Phinney.  First Tuesday of each month, 2 pm ?Check out additional information to Live Well Today on their website ? ?Parkinson and Movement Disorders (PMD) Alliance:  www.[email or web address] ?[employer]:  Online Education Events ?Sign up for emails, which are sent weekly to give you updates on programming and online offerings ? ?Parkinson's Association of [place]:  www.[email or web address] ?Information on online support groups, education events, and online exercises including Yoga, Parkinson's exercises and more-LOTS of information on links to PD resources and online events ?Virtual Support Group through [employer] of the [place]; next one is scheduled for Wednesday, April 5th at 2 pm. (These are typically  scheduled for the 1st Wednesday of the month at 2 pm).  Visit website for  details. ?MOVEMENT AND EXERCISE OPPORTUNITIES ?Parkinson's DRUMMING Classes/Music Therapy with Doylene Canning:  This is a returning class and it's FREE!  2nd Mondays, continuing April 10th  , 11:00 at the [place].  Contact *Misty Taylor-Paladino at [email or web address] or Doylene Canning at [phone] or [email or web address]  ?PWR! Moves Classes at [place].  Wednesdays 10 and 11 am.   Contact Amy Marriott, PT [email or web address] if interested. ?NEW PWR! Moves Class offering at [employer].  Wednesdays 1-2 pm, starting April 12th.  Contact Bryson Dames, Acupuncturist at [employer].  [email or web address] ?Here is a link to the PWR!Moves classes on Zoom from [place] - Daily Mon-Sat at 10:00. Via Zoom, FREE and open to all.  There is also a link below via Facebook if you use that platform. ? ?[email or web address] ?[email or web address] ? ?Parkinson's Wellness Recovery (PWR! Moves)  www.[email or web address] ?Info on the PWR! Virtual Experience:  You will have access to our expertise through self-assessment, guided plans that start with the PD-specific fundamentals, educational content, tips, Q&A with an expert, and a growing Art therapist of PD-specific pre-recorded and live exercise classes of varying types and intensity - both physical and cognitive! If that is not enough, we offer 1:1 wellness consultations (in-person or virtual) to personalize your PWR! Research scientist (medical).  ?[employer] Fridays:  ?As part of the PD Health @ Home program, this free video series focuses each week on one aspect of fitness designed to support people living with  Parkinson's.  These weekly videos highlight the [place] recent fitness guidelines for people with Parkinson's disease. ?www.KVTVnet.com.cy ?Dance for PD website is offering free, live-stream classes throughout the week, as well as links to [employer] of classes:  [email or web address] ?Dance for Parkinson's in-person class.  February 1-April 26, Wednesdays 4-5 pm.  Free class for people with Parkinson's disease, at [address] [place], [place].  Contact [phone] or [email or web address] to register ?Virtual dance and Pilates for Parkinson's classes: Click on the Community Tab> Parkinson's Movement Initiative Tab.  To register for classes and for more information, visit www.[email or web address].co.za and click the ?community? tab.  ?[employer] Parkinson's Cycling Classes  ?[employer]:  Thursdays @ Noon-Live classes at [employer] ([employer] at [email or web address] or [phone]) ?[facility] [employer] [employer]: Virtual Classes Mondays and Thursdays Jeanette Caprice classes Tuesday, Wednesday and Thursday (contact [place] at [email or web address]  or [phone]) ?[employer] ?Varied levels of classes are offered Mondays, Tuesdays and Thursdays at [employer].  ?Stretching with Verdis Frederickson weekly class is also offered for people with Parkinson's ?To observe a class or for more information, call [phone] or email Hezzie Bump at [email or web address] ?ADDITIONAL SUPPORT AND RESOURCES ?[employer]:Online Caregiver Education Opportunities:  www.well-springsolutions.org/caregiver-education/caregiver-support-group.  You may also contact Vickki Muff at [email or web address] or [phone].    ?Well-Spring Navigator:  Just1Navigator program, a free service to help individuals and families through the journey of determining care for older adults.  The ?Navigator? is a [phone],  Education officer, museum, who will speak with a prospective client and/or loved ones to provide an assessment of the situation and a set of recommendations for a personalized care plan -- all free of charge, and whether [facility]

## 2022-06-20 ENCOUNTER — Other Ambulatory Visit: Payer: Self-pay | Admitting: Neurology

## 2022-06-20 DIAGNOSIS — G20 Parkinson's disease: Secondary | ICD-10-CM

## 2022-08-05 NOTE — Patient Instructions (Signed)

## 2022-08-08 ENCOUNTER — Other Ambulatory Visit: Payer: Self-pay | Admitting: Nurse Practitioner

## 2022-08-08 ENCOUNTER — Ambulatory Visit (INDEPENDENT_AMBULATORY_CARE_PROVIDER_SITE_OTHER): Payer: Medicare Other | Admitting: Nurse Practitioner

## 2022-08-08 ENCOUNTER — Encounter: Payer: Self-pay | Admitting: Nurse Practitioner

## 2022-08-08 VITALS — BP 114/77 | HR 66 | Temp 98.4°F | Ht 71.0 in | Wt 246.9 lb

## 2022-08-08 DIAGNOSIS — E785 Hyperlipidemia, unspecified: Secondary | ICD-10-CM | POA: Diagnosis not present

## 2022-08-08 DIAGNOSIS — E559 Vitamin D deficiency, unspecified: Secondary | ICD-10-CM

## 2022-08-08 DIAGNOSIS — F419 Anxiety disorder, unspecified: Secondary | ICD-10-CM | POA: Diagnosis not present

## 2022-08-08 DIAGNOSIS — G20 Parkinson's disease: Secondary | ICD-10-CM | POA: Diagnosis not present

## 2022-08-08 DIAGNOSIS — N400 Enlarged prostate without lower urinary tract symptoms: Secondary | ICD-10-CM

## 2022-08-08 DIAGNOSIS — E1169 Type 2 diabetes mellitus with other specified complication: Secondary | ICD-10-CM

## 2022-08-08 DIAGNOSIS — Z0000 Encounter for general adult medical examination without abnormal findings: Secondary | ICD-10-CM

## 2022-08-08 DIAGNOSIS — K219 Gastro-esophageal reflux disease without esophagitis: Secondary | ICD-10-CM | POA: Diagnosis not present

## 2022-08-08 DIAGNOSIS — E538 Deficiency of other specified B group vitamins: Secondary | ICD-10-CM | POA: Diagnosis not present

## 2022-08-08 DIAGNOSIS — E6609 Other obesity due to excess calories: Secondary | ICD-10-CM

## 2022-08-08 DIAGNOSIS — Z6833 Body mass index (BMI) 33.0-33.9, adult: Secondary | ICD-10-CM

## 2022-08-08 DIAGNOSIS — Z1211 Encounter for screening for malignant neoplasm of colon: Secondary | ICD-10-CM | POA: Diagnosis not present

## 2022-08-08 DIAGNOSIS — Z9889 Other specified postprocedural states: Secondary | ICD-10-CM

## 2022-08-08 DIAGNOSIS — E66811 Obesity, class 1: Secondary | ICD-10-CM

## 2022-08-08 DIAGNOSIS — E669 Obesity, unspecified: Secondary | ICD-10-CM

## 2022-08-08 DIAGNOSIS — G20A1 Parkinson's disease without dyskinesia, without mention of fluctuations: Secondary | ICD-10-CM

## 2022-08-08 DIAGNOSIS — E1159 Type 2 diabetes mellitus with other circulatory complications: Secondary | ICD-10-CM

## 2022-08-08 LAB — BAYER DCA HB A1C WAIVED: HB A1C (BAYER DCA - WAIVED): 5.8 % — ABNORMAL HIGH (ref 4.8–5.6)

## 2022-08-08 MED ORDER — METFORMIN HCL ER 500 MG PO TB24: ORAL_TABLET | ORAL | 4 refills | Status: DC

## 2022-08-08 MED ORDER — GABAPENTIN 100 MG PO CAPS: ORAL_CAPSULE | ORAL | 4 refills | Status: DC

## 2022-08-08 NOTE — Assessment & Plan Note (Signed)
Noted low normal on October 2021 labs -- continue supplement and recheck today as is on chronic Metformin and concern for reduction in this level with chronic use.

## 2022-08-08 NOTE — Progress Notes (Signed)
BP 114/77   Pulse 66   Temp 98.4 F (36.9 C) (Oral)   Ht _0  (1.803 m)   Wt 246 lb 14.4 oz (112 kg)   SpO2 95%   BMI 34.44 kg/m    Subjective:    Patient ID: Brian Frederick, male    DOB: 1956/11/23, 66 y.o.   MRN: [id number]  HPI: Brian Frederick is a 66 y.o. male presenting on 08-30-22 for comprehensive medical examination. Current medical complaints include:none  He currently lives with: wife Interim Problems from his last visit: no   DIABETES Taking Metformin 500 MG in morning and 1000 MG in evening  and Trulicity 1.5 MG (gets with assistance).  A1c February 5.7%. Takes Gabapentin 200 MG BID for neuropathy -- which is offering benefit. Hypoglycemic episodes:no Polydipsia/polyuria: no Visual disturbance: no Chest pain: no Paresthesias: no Glucose Monitoring: yes             Accucheck frequency: Daily             Fasting glucose: 111 to 120 range             Post prandial:             Evening:             Before meals: Taking Insulin?: none             Long acting insulin: stopped this months back             Short acting insulin: Blood Pressure Monitoring: weekly Retinal Examination: Up to Date -- [facility] Foot Exam: Up to Date Pneumovax: Up to Date Influenza: Up to Date Aspirin: yes    HYPERTENSION / HYPERLIPIDEMIA Continues Lisinopril 5 MG daily, Metoprolol 100 MG daily, and Lipitor 20 MG + ASA. Satisfied with current treatment? yes Duration of hypertension: chronic BP monitoring frequency: not checking BP range:  BP medication side effects: no Duration of hyperlipidemia: chronic Cholesterol medication side effects: no Cholesterol supplements: none Medication compliance: good compliance Aspirin: yes Recent stressors: no Recurrent headaches: no Visual changes: no Palpitations: no Dyspnea: no Chest pain: no Lower extremity edema: no Dizzy/lightheaded: no    PARKINSON"S DISEASE: Initially diagnosed in 2015.  Sees Dr. Carles Collet. Denies any recent  falls or decline in function. Reports good control on current regimen.  Saw Dr. Carles Collet last on 04/12/22 and no changes were made -- continues on Artane and Sinemet. Goes to dermatology regularly, last 02/14/22.   ANXIETY/STRESS Continues Lexapro 10 MG daily. Duration:controlled Anxious mood: no  Excessive worrying: no Irritability: no  Sweating: no Nausea: no Palpitations:no Hyperventilation: no Panic attacks: no Agoraphobia: no  Obscessions/compulsions: no Depressed mood: no Anhedonia: no Weight changes: no Insomnia: yes hard to stay asleep  Hypersomnia: no Fatigue/loss of energy: no Feelings of worthlessness: no Feelings of guilt: no Impaired concentration/indecisiveness: no Suicidal ideations: no  Crying spells: no Recent Stressors/Life Changes: no   Relationship problems: no   Family stress: no     Financial stress: no    Job stress: no    Recent death/loss: no    2022/08/30    9:32 AM 02/07/2022   10:16 AM 12/13/2021   11:28 AM 11/07/2021   11:20 AM 08/07/2021    2:19 PM  Depression screen PHQ 2/9  Decreased Interest 0 0 0 0 0  Down, Depressed, Hopeless 0 0 0 0 0  PHQ - 2 Score 0 0 0 0 0  Altered sleeping 0 0 0 1  Tired, decreased energy 1 1 0 1   Change in appetite 0 0 0 0   Feeling bad or failure about yourself  0 0 0 0   Trouble concentrating 0 0 0 0   Moving slowly or fidgety/restless 0 1  0   Suicidal thoughts 0 0  0   PHQ-9 Score 1 2 0 2   Difficult doing work/chores Not difficult at all Not difficult at all         08/08/2022    9:33 AM 02/07/2022   10:16 AM 11/07/2021   11:21 AM 04/12/2021    9:16 AM  GAD 7 : Generalized Anxiety Score  Nervous, Anxious, on Edge 0 0 0 0  Control/stop worrying 0 0 0 0  Worry too much - different things 0 0 0 0  Trouble relaxing 0 0 0 0  Restless 0 1 0 0  Easily annoyed or irritable 0 0 0 0  Afraid - awful might happen 0 0 0 0  Total GAD 7 Score 0 1 0 0  Anxiety Difficulty Not difficult at all Not difficult at all   Not difficult at all   GERD On Prevacid 30 MG as needed. GERD control status: stable  Satisfied with current treatment? yes Heartburn frequency:  Medication side effects: no  Medication compliance: stable Dysphagia: no Odynophagia:  no Hematemesis: no Blood in stool: no EGD: no   Functional Status Survey: Is the patient deaf or have difficulty hearing?: No Does the patient have difficulty seeing, even when wearing glasses/contacts?: No Does the patient have difficulty concentrating, remembering, or making decisions?: No Does the patient have difficulty walking or climbing stairs?: No Does the patient have difficulty dressing or bathing?: No Does the patient have difficulty doing errands alone such as visiting a doctor's office or shopping?: No  FALL RISK:    04/12/2022   10:55 AM 12/13/2021   11:23 AM 10/10/2021   10:51 AM 04/06/2021    3:26 PM 01/11/2021    8:38 AM  [place] in the past year? 0 0 0 0 0  Number falls in past yr: 0 0 0 0   Injury with Fall? 0 0 0 0   Follow up  Falls evaluation completed;Falls prevention discussed;Education provided       Advanced Directives <no information>  Past Medical History:  Past Medical History:  Diagnosis Date   Achilles tendon disorder    Anxiety    Basal cell carcinoma 06/23/2008   Mid dorsum nose. Ulcerated.   BPH (benign prostatic hypertrophy)    Cancer (HCC)    basal cell   Chronic kidney disease    Hyperlipidemia    Hypertension    Hypogonadism in male    Osteoarthritis    Prediabetes    Squamous cell carcinoma of skin 10/27/2013   Right distal lateral thigh. SCCis   SVT (supraventricular tachycardia) (HCC)     Surgical History:  Past Surgical History:  Procedure Laterality Date   BASAL CELL CARCINOMA EXCISION     cardio oblation     CHOLECYSTECTOMY     COLONOSCOPY WITH PROPOFOL N/A 04/12/2017   Procedure: COLONOSCOPY WITH PROPOFOL;  Surgeon: [name], MD;  Location: [facility];   Service: Endoscopy;  Laterality: N/A;   JOINT REPLACEMENT Left    shoulder   JOINT REPLACEMENT Right    shoulder   POLYPECTOMY N/A 04/12/2017   Procedure: POLYPECTOMY;  Surgeon: [name], MD;  Location: [facility];  Service: Endoscopy;  Laterality: N/A;   SHOULDER SURGERY Right 11/28/2018    Medications:  Current Outpatient Medications on File Prior to Visit  Medication Sig   atorvastatin (LIPITOR) 20 MG tablet Take 1 tablet (20 mg total) by mouth daily.   BD PEN NEEDLE NANO 2ND GEN 32G X 4 MM MISC USE 1 PEN NEEDLE EVERY MORNING   blood glucose meter kit and supplies KIT Dispense based on patient and insurance preference. Use up to four times daily as directed. (FOR ICD-9 250.00, 250.01).   carbidopa-levodopa (SINEMET IR) 25-100 MG tablet TAKE 2 TABLETS BY MOUTH AT 8 AM, 2 TABLETS AT NOON AND 1 TABLET AT 4 PM   Dulaglutide (TRULICITY) 1.5 JQ/3.0SP SOPN ADMINISTER 1.5 MG UNDER THE SKIN 1 TIME A WEEK   escitalopram (LEXAPRO) 10 MG tablet Take 1 tablet (10 mg total) by mouth daily.   lansoprazole (PREVACID) 30 MG capsule TAKE 1 CAPSULE BY MOUTH DAILY AT 12 NOON   lisinopril (ZESTRIL) 5 MG tablet TAKE 1 TABLET(5 MG) BY MOUTH DAILY   metoprolol succinate (TOPROL-XL) 100 MG 24 hr tablet TAKE 1 TABLET BY MOUTH EVERY DAY WITH FOOD OR IMMEDIATELY AFTER A MEAL   Multiple Vitamin (MULTIVITAMIN) tablet Take 1 tablet by mouth daily.   ONETOUCH VERIO test strip USE TO TEST BLOOD SUGAR UP TO FOUR TIMES DAILY AS DIRECTED   sildenafil (VIAGRA) 100 MG tablet Take 0.5-1 tablets (50-100 mg total) by mouth daily as needed for erectile dysfunction.   trihexyphenidyl (ARTANE) 2 MG tablet TAKE 1 TABLET(2 MG) BY MOUTH THREE TIMES DAILY WITH MEALS   aspirin 81 MG tablet Take 81 mg by mouth daily. (Patient not taking: Reported on 08/08/2022)   No current facility-administered medications on file prior to visit.    Allergies:  Allergies  Allergen Reactions   Amoxil [Amoxicillin] Swelling    Did it  involve swelling of the face/tongue/throat, SOB, or low BP? No Did it involve sudden or severe rash/hives, skin peeling, or any reaction on the inside of your mouth or nose? No Did you need to seek medical attention at a hospital or doctor's office? No When did it last happen?      15 years ago If all above answers are "NO", may proceed with cephalosporin use.    Penicillin G Benzathine Swelling    Did it involve swelling of the face/tongue/throat, SOB, or low BP? No Did it involve sudden or severe rash/hives, skin peeling, or any reaction on the inside of your mouth or nose? No Did you need to seek medical attention at a hospital or doctor's office? No When did it last happen?      15 years ago If all above answers are "NO", may proceed with cephalosporin use.     Social History:  Social History   Socioeconomic History   Marital status: Married    Spouse name: Not on file   Number of children: 3   Years of education: Not on file   Highest education level: Associate degree: occupational, Hotel manager, or vocational program  Occupational History   Occupation: retired    Comment: Dealer  Tobacco Use   Smoking status: Never   Smokeless tobacco: Never  Vaping Use   Vaping Use: Never used  Substance and Sexual Activity   Alcohol use: No    Alcohol/week: 0.0 standard drinks of alcohol   Drug use: No   Sexual activity: Yes  Other Topics Concern   Not on file  Social History Narrative  Married lives with spouse   Has 3 kids biological 1 step child   Right handed   Drinks coffee 2 cups a day, sweet tea with meals, 2-3 soda's a day   Social Determinants of Health   Financial Resource Strain: Low Risk  (12/13/2021)   Overall Financial Resource Strain (CARDIA)    Difficulty of Paying Living Expenses: Not very hard  Food Insecurity: No Food Insecurity (12/13/2021)   Hunger Vital Sign    Worried About Running Out of Food in the Last Year: Never true    Ran Out of Food in the  Last Year: Never true  Transportation Needs: No Transportation Needs (12/13/2021)   PRAPARE - Hydrologist (Medical): No    Lack of Transportation (Non-Medical): No  Physical Activity: Inactive (12/13/2021)   Exercise Vital Sign    Days of Exercise per Week: 0 days    Minutes of Exercise per Session: 0 min  Stress: No Stress Concern Present (12/13/2021)   [place]    Feeling of Stress : Only a little  Social Connections: Moderately Isolated (12/13/2021)   Social Connection and Isolation Panel [NHANES]    Frequency of Communication with Friends and Family: Three times a week    Frequency of Social Gatherings with Friends and Family: Three times a week    Attends Religious Services: Never    Active Member of Clubs or Organizations: No    Attends Archivist Meetings: Never    Marital Status: Married  Human resources officer Violence: Not At Risk (12/13/2021)   Humiliation, Afraid, Rape, and Kick questionnaire    Fear of Current or Ex-Partner: No    Emotionally Abused: No    Physically Abused: No    Sexually Abused: No   Social History   Tobacco Use  Smoking Status Never  Smokeless Tobacco Never   Social History   Substance and Sexual Activity  Alcohol Use No   Alcohol/week: 0.0 standard drinks of alcohol    Family History:  Family History  Problem Relation Age of Onset   Cancer Mother        breast   Heart disease Father 14       CABG   Diabetes Father    CAD Father    Hyperlipidemia Father    Hypertension Father    Cancer Sister        breast   Liver cancer Sister    Hypothyroidism Sister    Stroke Maternal Grandfather    Diabetes Paternal Grandmother     Past medical history, surgical history, medications, allergies, family history and social history reviewed with patient today and changes made to appropriate areas of the chart.   Review of Systems -  negative All other ROS negative except what is listed above and in the HPI.      Objective:    BP 114/77   Pulse 66   Temp 98.4 F (36.9 C) (Oral)   Ht _0  (1.803 m)   Wt 246 lb 14.4 oz (112 kg)   SpO2 95%   BMI 34.44 kg/m   Wt Readings from Last 3 Encounters:  08/08/22 246 lb 14.4 oz (112 kg)  04/12/22 250 lb 12.8 oz (113.8 kg)  02/07/22 251 lb 6.4 oz (114 kg)    Physical Exam Vitals and nursing note reviewed.  Constitutional:      General: He is awake. He is not in acute distress.  Appearance: He is well-developed and well-groomed. He is morbidly obese. He is not ill-appearing or toxic-appearing.  HENT:     Head: Normocephalic and atraumatic.     Right Ear: Hearing, tympanic membrane, ear canal and external ear normal. No drainage.     Left Ear: Hearing, tympanic membrane, ear canal and external ear normal. No drainage.     Nose: Nose normal.     Mouth/Throat:     Pharynx: Uvula midline.  Eyes:     General: Lids are normal.        Right eye: No discharge.        Left eye: No discharge.     Extraocular Movements: Extraocular movements intact.     Conjunctiva/sclera: Conjunctivae normal.     Pupils: Pupils are equal, round, and reactive to light.     Visual Fields: Right eye visual fields normal and left eye visual fields normal.  Neck:     Thyroid: No thyromegaly.     Vascular: No carotid bruit or JVD.     Trachea: Trachea normal.  Cardiovascular:     Rate and Rhythm: Normal rate and regular rhythm.     Heart sounds: Normal heart sounds, S1 normal and S2 normal. No murmur heard.    No gallop.  Pulmonary:     Effort: Pulmonary effort is normal. No accessory muscle usage or respiratory distress.     Breath sounds: Normal breath sounds.  Abdominal:     General: Bowel sounds are normal.     Palpations: Abdomen is soft. There is no hepatomegaly or splenomegaly.     Tenderness: There is no abdominal tenderness.  Musculoskeletal:        General: Normal range of  motion.     Cervical back: Normal range of motion and neck supple.     Right lower leg: No edema.     Left lower leg: No edema.  Lymphadenopathy:     Head:     Right side of head: No submental, submandibular, tonsillar, preauricular or posterior auricular adenopathy.     Left side of head: No submental, submandibular, tonsillar, preauricular or posterior auricular adenopathy.     Cervical: No cervical adenopathy.  Skin:    General: Skin is warm and dry.     Capillary Refill: Capillary refill takes less than 2 seconds.     Findings: No rash.  Neurological:     Mental Status: He is alert and oriented to person, place, and time.     Gait: Gait is intact.     Deep Tendon Reflexes: Reflexes are normal and symmetric.     Reflex Scores:      Brachioradialis reflexes are 2+ on the right side and 2+ on the left side.      Patellar reflexes are 2+ on the right side and 2+ on the left side. Psychiatric:        Attention and Perception: Attention normal.        Mood and Affect: Mood normal.        Speech: Speech normal.        Behavior: Behavior normal. Behavior is cooperative.        Thought Content: Thought content normal.        Cognition and Memory: Cognition normal.        Judgment: Judgment normal.    Diabetic Foot Exam - Simple   Simple Foot Form Visual Inspection No deformities, no ulcerations, no other skin breakdown bilaterally: Yes Sensation Testing See comments:  Yes Pulse Check Posterior Tibialis and Dorsalis pulse intact bilaterally: Yes Comments 6/10 left and right foot sensation.    Results for orders placed or performed in visit on 08/08/22  Bayer DCA Hb A1c Waived  Result Value Ref Range   HB A1C (BAYER DCA - WAIVED) 5.8 (H) 4.8 - 5.6 %      Assessment & Plan:   Problem List Items Addressed This Visit       Cardiovascular and Mediastinum   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HCC)    Chronic ongoing, A1c today 5.8%, continue to lose weight  and remains off insulin. Will continue current medication regimen as is offering benefit, if any increase in A1c above goal could consider increase in Trulicity (obtains via CCM).  Check BS 2-3 times a day and document + continue heavy focus on diet.  Return in 6 months. - Foot and eye exam up to date - Pneumococcal vaccines up to date.      Relevant Medications   metFORMIN (GLUCOPHAGE-XR) 500 MG 24 hr tablet   Other Relevant Orders   Bayer DCA Hb A1c Waived (Completed)   CBC with Differential/Platelet   Comprehensive metabolic panel   TSH     Digestive   GERD (gastroesophageal reflux disease)    Chronic, stable.  Continue current medication regimen and adjust as needed.  Mag level today.      Relevant Orders   Magnesium     Endocrine   Hyperlipidemia associated with type 2 diabetes mellitus (HCC)    Chronic, ongoing.  Continue current medication regimen and adjust as needed.  Lipid panel today, recent LDL < 70.      Relevant Medications   metFORMIN (GLUCOPHAGE-XR) 500 MG 24 hr tablet   Other Relevant Orders   Bayer DCA Hb A1c Waived (Completed)   Comprehensive metabolic panel   Lipid Panel w/o Chol/HDL Ratio   Type 2 diabetes mellitus with obesity (HCC) - Primary    Chronic, ongoing with A1c 5.8% today, remaining stable and well below goal. Urine ALB 30  (February 2022), continue Lisinopril. Continue Trulicity 1.5 MG weekly and to continue Metformin, has been off Levemir for some time.  Will consider increase in Trulicity to 3 MG weekly OR addition of SGLT2 for cardiac health to maintain A1c goal if elevations in future - with goal to maintain off insulin. CCM involved, Trulicity via them.  Return in 6 months for A1C check.        Relevant Medications   metFORMIN (GLUCOPHAGE-XR) 500 MG 24 hr tablet   Other Relevant Orders   Bayer DCA Hb A1c Waived (Completed)   Comprehensive metabolic panel     Nervous and Auditory   Parkinson's disease ([employer])    Followed by neurology,  continue collaboration and current medication regimen as prescribed by them.  Recent note reviewed and labs.      Relevant Medications   gabapentin (NEURONTIN) 100 MG capsule   Other Relevant Orders   CBC with Differential/Platelet   TSH     Genitourinary   Benign prostatic hyperplasia    No current medications, PSA check today.      Relevant Orders   PSA     Other   Chronic anxiety    Chronic, stable on Lexapro.  Continue low dose medication and monitor.  Denies SI/HI.      Obesity    BMI 34.44 with some further loss with T2DM, HTN/HLD -- praised for success.  Recommended eating smaller high  protein, low fat meals more frequently and exercising 30 mins a day 5 times a week with a goal of 10-15lb weight loss in the next 3 months. Patient voiced their understanding and motivation to adhere to these recommendations.       Relevant Medications   metFORMIN (GLUCOPHAGE-XR) 500 MG 24 hr tablet   Vitamin B12 deficiency    Noted low normal on October 2021 labs -- continue supplement and recheck today as is on chronic Metformin and concern for reduction in this level with chronic use.      Relevant Orders   Vitamin B12   Other Visit Diagnoses     Vitamin D deficiency       History of low levels reported, check today and initiate supplement as needed.   Relevant Orders   VITAMIN D 25 Hydroxy (Vit-D Deficiency, Fractures)   Colon cancer screening       Referral to GI placed.   Relevant Orders   Ambulatory referral to Gastroenterology   Encounter for annual physical exam       Annual physical today with labs and health maintenance reviewed, discussed with patient.        Discussed aspirin prophylaxis for myocardial infarction prevention and decision was made to continue ASA  LABORATORY TESTING:  Health maintenance labs ordered today as discussed above.   The natural history of prostate cancer and ongoing controversy regarding screening and potential treatment outcomes of  prostate cancer has been discussed with the patient. The meaning of a false positive PSA and a false negative PSA has been discussed. He indicates understanding of the limitations of this screening test and wishes to proceed with screening PSA testing.   IMMUNIZATIONS:   - Tdap: Tetanus vaccination status reviewed: last tetanus booster within 10 years.  - Influenza: Up to date - Pneumovax: Up to date  - Prevnar: Up To Date - Zostavax vaccine: Up to date  SCREENING: - Colonoscopy: Up to date due next 04/12/22 Discussed with patient purpose of the colonoscopy is to detect colon cancer at curable precancerous or early stages   - AAA Screening: Not applicable  -Hearing Test: Not applicable  -Spirometry: Not applicable   PATIENT COUNSELING:    Sexuality: Discussed sexually transmitted diseases, partner selection, use of condoms, avoidance of unintended pregnancy  and contraceptive alternatives.   Advised to avoid cigarette smoking.  I discussed with the patient that most people either abstain from alcohol or drink within safe limits (<=14/week and <=4 drinks/occasion for males, <=7/weeks and <= 3 drinks/occasion for females) and that the risk for alcohol disorders and other health effects rises proportionally with the number of drinks per week and how often a drinker exceeds daily limits.  Discussed cessation/primary prevention of drug use and availability of treatment for abuse.   Diet: Encouraged to adjust caloric intake to maintain  or achieve ideal body weight, to reduce intake of dietary saturated fat and total fat, to limit sodium intake by avoiding high sodium foods and not adding table salt, and to maintain adequate dietary potassium and calcium preferably from fresh fruits, vegetables, and low-fat dairy products.    Stressed the importance of regular exercise  Injury prevention: Discussed safety belts, safety helmets, smoke detector, smoking near bedding or upholstery.   Dental  health: Discussed importance of regular tooth brushing, flossing, and dental visits.   Follow up plan: NEXT PREVENTATIVE PHYSICAL DUE IN 1 YEAR. Return in about 6 months (around 02/08/2023) for T2DM, HTN/HLD, PD, GERD.

## 2022-08-08 NOTE — Assessment & Plan Note (Signed)
No current medications, PSA check today.

## 2022-08-08 NOTE — Assessment & Plan Note (Signed)
Chronic ongoing, A1c today 5.8%, continue to lose weight and remains off insulin. Will continue current medication regimen as is offering benefit, if any increase in A1c above goal could consider increase in Trulicity (obtains via CCM).  Check BS 2-3 times a day and document + continue heavy focus on diet.  Return in 6 months. - Foot and eye exam up to date - Pneumococcal vaccines up to date.

## 2022-08-08 NOTE — Assessment & Plan Note (Signed)
Chronic, stable on Lexapro.  Continue low dose medication and monitor.  Denies SI/HI. 

## 2022-08-08 NOTE — Telephone Encounter (Signed)
Requested medication (s) are due for refill today:no  Requested medication (s) are on the active medication list: yes  Last refill:  08/07/21, 90 and 4 RF  Future visit scheduled: yes  Notes to clinic:  Unable to refill per protocol, rx request is too soon, last refill 08/07/21 for 90, 4 refills. Routing for review.     Requested Prescriptions  Pending Prescriptions Disp Refills   lansoprazole (PREVACID) 30 MG capsule [Pharmacy Med Name: LANSOPRAZOLE '30MG'$  DR CAPSULES] 90 capsule 4    Sig: TAKE ONE CAPSULE BY MOUTH DAILY AT 12 NOON     Gastroenterology: Proton Pump Inhibitors 2 Passed - 08/08/2022  6:27 AM      Passed - ALT in normal range and within 360 days    ALT  Date Value Ref Range Status  02/07/2022 25 0 - 44 IU/L Final   SGPT (ALT)  Date Value Ref Range Status  09/02/2013 38 12 - 78 U/L Final         Passed - AST in normal range and within 360 days    AST  Date Value Ref Range Status  02/07/2022 31 0 - 40 IU/L Final   SGOT(AST)  Date Value Ref Range Status  09/02/2013 30 15 - 90 Unit/L Final         Passed - Valid encounter within last 12 months    Recent Outpatient Visits           Today Type 2 diabetes mellitus with obesity ([place])   [place], Jolene T, NP   6 months ago Type 2 diabetes mellitus with obesity ([place])   [place], Jolene T, NP   9 months ago Type 2 diabetes mellitus with obesity ([place])   [place] Cannady, Jolene T, NP   9 months ago Lab test positive for detection of COVID-19 virus   [facility] [place], [place] T, NP   1 year ago 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[place])   [place], Jolene T, NP       Future Appointments             In 2 weeks Ralene Bathe, MD [facility]   In 6 months Cannady, Barbaraann Faster, NP [facility]

## 2022-08-08 NOTE — Assessment & Plan Note (Signed)
BMI 34.44 with some further loss with T2DM, HTN/HLD -- praised for success.  Recommended eating smaller high protein, low fat meals more frequently and exercising 30 mins a day 5 times a week with a goal of 10-15lb weight loss in the next 3 months. Patient voiced their understanding and motivation to adhere to these recommendations.

## 2022-08-08 NOTE — Assessment & Plan Note (Signed)
Chronic, stable. Continue current medication regimen and adjust as needed.  Mag level today. 

## 2022-08-08 NOTE — Assessment & Plan Note (Signed)
Chronic, ongoing with A1c 5.8% today, remaining stable and well below goal. Urine ALB 30  (February 2022), continue Lisinopril. Continue Trulicity 1.5 MG weekly and to continue Metformin, has been off Levemir for some time.  Will consider increase in Trulicity to 3 MG weekly OR addition of SGLT2 for cardiac health to maintain A1c goal if elevations in future - with goal to maintain off insulin. CCM involved, Trulicity via them.  Return in 6 months for A1C check.

## 2022-08-08 NOTE — Assessment & Plan Note (Signed)
Chronic, ongoing.  Continue current medication regimen and adjust as needed.  Lipid panel today, recent LDL < 70. 

## 2022-08-08 NOTE — Assessment & Plan Note (Signed)
Followed by neurology, continue collaboration and current medication regimen as prescribed by them.  Recent note reviewed and labs.

## 2022-08-09 ENCOUNTER — Other Ambulatory Visit: Payer: Self-pay

## 2022-08-09 ENCOUNTER — Telehealth: Payer: Self-pay

## 2022-08-09 DIAGNOSIS — Z86010 Personal history of colonic polyps: Secondary | ICD-10-CM

## 2022-08-09 LAB — COMPREHENSIVE METABOLIC PANEL
ALT: 13 IU/L (ref 0–44)
AST: 24 IU/L (ref 0–40)
Albumin/Globulin Ratio: 2.3 — ABNORMAL HIGH (ref 1.2–2.2)
Albumin: 4.5 g/dL (ref 3.9–4.9)
Alkaline Phosphatase: 102 IU/L (ref 44–121)
BUN/Creatinine Ratio: 20 (ref 10–24)
BUN: 22 mg/dL (ref 8–27)
Bilirubin Total: 0.6 mg/dL (ref 0.0–1.2)
CO2: 24 mmol/L (ref 20–29)
Calcium: 9.4 mg/dL (ref 8.6–10.2)
Chloride: 102 mmol/L (ref 96–106)
Creatinine, Ser: 1.11 mg/dL (ref 0.76–1.27)
Globulin, Total: 2 g/dL (ref 1.5–4.5)
Glucose: 100 mg/dL — ABNORMAL HIGH (ref 70–99)
Potassium: 4.5 mmol/L (ref 3.5–5.2)
Sodium: 143 mmol/L (ref 134–144)
Total Protein: 6.5 g/dL (ref 6.0–8.5)
eGFR: 73 mL/min/{1.73_m2} (ref >59)

## 2022-08-09 LAB — MAGNESIUM: Magnesium: 1.9 mg/dL (ref 1.6–2.3)

## 2022-08-09 LAB — CBC WITH DIFFERENTIAL/PLATELET
Basophils Absolute: 0 10*3/uL (ref 0.0–0.2)
Basos: 1 %
EOS (ABSOLUTE): 0.3 10*3/uL (ref 0.0–0.4)
Eos: 5 %
Hematocrit: 45.1 % (ref 37.5–51.0)
Hemoglobin: 14.9 g/dL (ref 13.0–17.7)
Immature Grans (Abs): 0 10*3/uL (ref 0.0–0.1)
Immature Granulocytes: 1 %
Lymphocytes Absolute: 2.4 10*3/uL (ref 0.7–3.1)
Lymphs: 37 %
MCH: 29.8 pg (ref 26.6–33.0)
MCHC: 33 g/dL (ref 31.5–35.7)
MCV: 90 fL (ref 79–97)
Monocytes Absolute: 0.5 10*3/uL (ref 0.1–0.9)
Monocytes: 8 %
Neutrophils Absolute: 3.1 10*3/uL (ref 1.4–7.0)
Neutrophils: 48 %
Platelets: 136 10*3/uL — ABNORMAL LOW (ref 150–450)
RBC: 5 x10E6/uL (ref 4.14–5.80)
RDW: 13.4 % (ref 11.6–15.4)
WBC: 6.4 10*3/uL (ref 3.4–10.8)

## 2022-08-09 LAB — LIPID PANEL W/O CHOL/HDL RATIO
Cholesterol, Total: 94 mg/dL — ABNORMAL LOW (ref 100–199)
HDL: 31 mg/dL — ABNORMAL LOW (ref >39)
LDL Chol Calc (NIH): 37 mg/dL (ref 0–99)
Triglycerides: 153 mg/dL — ABNORMAL HIGH (ref 0–149)
VLDL Cholesterol Cal: 26 mg/dL (ref 5–40)

## 2022-08-09 LAB — VITAMIN D 25 HYDROXY (VIT D DEFICIENCY, FRACTURES): Vit D, 25-Hydroxy: 34 ng/mL (ref 30.0–100.0)

## 2022-08-09 LAB — PSA: Prostate Specific Ag, Serum: 0.3 ng/mL (ref 0.0–4.0)

## 2022-08-09 LAB — TSH: TSH: 2.08 u[IU]/mL (ref 0.450–4.500)

## 2022-08-09 LAB — VITAMIN B12: Vitamin B-12: 409 pg/mL (ref 232–1245)

## 2022-08-09 MED ORDER — NA SULFATE-K SULFATE-MG SULF 17.5-3.13-1.6 GM/177ML PO SOLN: 1.0000 | Freq: Once | ORAL | 0 refills | Status: AC

## 2022-08-09 NOTE — Telephone Encounter (Signed)
Gastroenterology Pre-Procedure Review  Request Date: 09/25/22 Requesting Physician: Dr. Allen Norris  PATIENT REVIEW QUESTIONS: The patient responded to the following health history questions as indicated:    1. Are you having any GI issues? yes (diarrhea few times a weeks for the past 6 months thinks it may be related to meds) 2. Do you have a personal history of Polyps? yes (04/12/17 performed by Dr. Allen Norris) 3. Do you have a family history of Colon Cancer or Polyps? no 4. Diabetes Mellitus? yes (patient has been advised to hold metformin 2 days prior to colonoscopy, hold Trulicity 7 days prior to colonoscopy) 5. Joint replacements in the past 12 months?no 6. Major health problems in the past 3 months?no 7. Any artificial heart valves, MVP, or defibrillator?no    MEDICATIONS & ALLERGIES:    Patient reports the following regarding taking any anticoagulation/antiplatelet therapy:   Plavix, Coumadin, Eliquis, Xarelto, Lovenox, Pradaxa, Brilinta, or Effient? no Aspirin? yes (81 mg daily)  Patient confirms/reports the following medications:  Current Outpatient Medications  Medication Sig Dispense Refill   aspirin 81 MG tablet Take 81 mg by mouth daily. (Patient not taking: Reported on 08/08/2022)     atorvastatin (LIPITOR) 20 MG tablet Take 1 tablet (20 mg total) by mouth daily. 90 tablet 4   BD PEN NEEDLE NANO 2ND GEN 32G X 4 MM MISC USE 1 PEN NEEDLE EVERY MORNING 100 each 0   blood glucose meter kit and supplies KIT Dispense based on patient and insurance preference. Use up to four times daily as directed. (FOR ICD-9 250.00, 250.01). 1 each 2   carbidopa-levodopa (SINEMET IR) 25-100 MG tablet TAKE 2 TABLETS BY MOUTH AT 8 AM, 2 TABLETS AT NOON AND 1 TABLET AT 4 PM 450 tablet 0   Dulaglutide (TRULICITY) 1.5 RF/1.6BW SOPN ADMINISTER 1.5 MG UNDER THE SKIN 1 TIME A WEEK 6 mL 4   escitalopram (LEXAPRO) 10 MG tablet Take 1 tablet (10 mg total) by mouth daily. 90 tablet 4   gabapentin (NEURONTIN) 100 MG  capsule TAKE 2 CAPSULE (200 MG) BY MOUTH TWICE A DAY. 360 capsule 4   lansoprazole (PREVACID) 30 MG capsule TAKE ONE CAPSULE BY MOUTH DAILY AT 12 NOON 90 capsule 4   lisinopril (ZESTRIL) 5 MG tablet TAKE 1 TABLET(5 MG) BY MOUTH DAILY 90 tablet 4   metFORMIN (GLUCOPHAGE-XR) 500 MG 24 hr tablet TAKE 2 TABLETS(1000 MG) BY MOUTH TWICE DAILY. 360 tablet 4   metoprolol succinate (TOPROL-XL) 100 MG 24 hr tablet TAKE 1 TABLET BY MOUTH EVERY DAY WITH FOOD OR IMMEDIATELY AFTER A MEAL 90 tablet 4   Multiple Vitamin (MULTIVITAMIN) tablet Take 1 tablet by mouth daily.     ONETOUCH VERIO test strip USE TO TEST BLOOD SUGAR UP TO FOUR TIMES DAILY AS DIRECTED 300 strip 4   sildenafil (VIAGRA) 100 MG tablet Take 0.5-1 tablets (50-100 mg total) by mouth daily as needed for erectile dysfunction. 5 tablet 11   trihexyphenidyl (ARTANE) 2 MG tablet TAKE 1 TABLET(2 MG) BY MOUTH THREE TIMES DAILY WITH MEALS 270 tablet 1   No current facility-administered medications for this visit.    Patient confirms/reports the following allergies:  Allergies  Allergen Reactions   Amoxil [Amoxicillin] Swelling    Did it involve swelling of the face/tongue/throat, SOB, or low BP? No Did it involve sudden or severe rash/hives, skin peeling, or any reaction on the inside of your mouth or nose? No Did you need to seek medical attention at a hospital or doctor's office?  No When did it last happen?      15 years ago If all above answers are "NO", may proceed with cephalosporin use.    Penicillin G Benzathine Swelling    Did it involve swelling of the face/tongue/throat, SOB, or low BP? No Did it involve sudden or severe rash/hives, skin peeling, or any reaction on the inside of your mouth or nose? No Did you need to seek medical attention at a hospital or doctor's office? No When did it last happen?      15 years ago If all above answers are "NO", may proceed with cephalosporin use.     No orders of the defined types were placed  in this encounter.   AUTHORIZATION INFORMATION Primary Insurance: 1D#: Group #:  Secondary Insurance: 1D#: Group #:  SCHEDULE INFORMATION: Date:  Time: Location:

## 2022-08-09 NOTE — Progress Notes (Signed)
Contacted via MyChart   Good evening Brian Frederick, your labs have returned: - CBC shows no anemia or infection. - Kidney function, creatinine and eGFR, remains normal, as is liver function, AST and ALT.   - Triglycerides a little elevated, but LDL looks great.  Continue statin and continue work on diet -- you are doing amazing. - Remainder of labs are overall stable.  Any questions? Keep being amazing!!  Thank you for allowing me to participate in your care.  I appreciate you. Kindest regards, Wyat Infinger

## 2022-08-23 ENCOUNTER — Ambulatory Visit: Payer: Medicare Other | Admitting: Dermatology

## 2022-08-23 DIAGNOSIS — L821 Other seborrheic keratosis: Secondary | ICD-10-CM

## 2022-08-23 DIAGNOSIS — L570 Actinic keratosis: Secondary | ICD-10-CM

## 2022-08-23 DIAGNOSIS — L578 Other skin changes due to chronic exposure to nonionizing radiation: Secondary | ICD-10-CM | POA: Diagnosis not present

## 2022-08-23 DIAGNOSIS — Z5111 Encounter for antineoplastic chemotherapy: Secondary | ICD-10-CM | POA: Diagnosis not present

## 2022-08-23 DIAGNOSIS — L814 Other melanin hyperpigmentation: Secondary | ICD-10-CM

## 2022-08-23 DIAGNOSIS — Z79899 Other long term (current) drug therapy: Secondary | ICD-10-CM | POA: Diagnosis not present

## 2022-08-23 MED ORDER — FLUOROURACIL 5 % EX CREA: TOPICAL_CREAM | Freq: Two times a day (BID) | CUTANEOUS | 0 refills | Status: DC

## 2022-08-23 NOTE — Patient Instructions (Signed)
Due to recent changes in healthcare laws, you may see results of your pathology and/or laboratory studies on MyChart before the doctors have had a chance to review them. We understand that in some cases there may be results that are confusing or concerning to you. Please understand that not all results are received at the same time and often the doctors may need to interpret multiple results in order to provide you with the best plan of care or course of treatment. Therefore, we ask that you please give us 2 business days to thoroughly review all your results before contacting the office for clarification. Should we see a critical lab result, you will be contacted sooner.   If You Need Anything After Your Visit  If you have any questions or concerns for your doctor, please call our main line at [phone] and press option 4 to reach your doctor's medical assistant. If no one answers, please leave a voicemail as directed and we will return your call as soon as possible. Messages left after 4 pm will be answered the following business day.   You may also send us a message via MyChart. We typically respond to MyChart messages within 1-2 business days.  For prescription refills, please ask your pharmacy to contact our office. Our fax number is [phone].  If you have an urgent issue when the clinic is closed that cannot wait until the next business day, you can page your doctor at the number below.    Please note that while we do our best to be available for urgent issues outside of office hours, we are not available 24/7.   If you have an urgent issue and are unable to reach us, you may choose to seek medical care at your doctor's office, retail clinic, urgent care center, or emergency room.  If you have a medical emergency, please immediately call 911 or go to the emergency department.  Pager Numbers  - Dr. Kowalski: [phone]  - Dr. Moye: [phone]  - Dr. Stewart:  [phone]  In the event of inclement weather, please call our main line at [phone] for an update on the status of any delays or closures.  Dermatology Medication Tips: Please keep the boxes that topical medications come in in order to help keep track of the instructions about where and how to use these. Pharmacies typically print the medication instructions only on the boxes and not directly on the medication tubes.   If your medication is too expensive, please contact our office at [phone] option 4 or send us a message through MyChart.   We are unable to tell what your co-pay for medications will be in advance as this is different depending on your insurance coverage. However, we may be able to find a substitute medication at lower cost or fill out paperwork to get insurance to cover a needed medication.   If a prior authorization is required to get your medication covered by your insurance company, please allow us 1-2 business days to complete this process.  Drug prices often vary depending on where the prescription is filled and some pharmacies may offer cheaper prices.  The website www.goodrx.com contains coupons for medications through different pharmacies. The prices here do not account for what the cost may be with help from insurance (it may be cheaper with your insurance), but the website can give you the price if you did not use any insurance.  - You can print the associated coupon and take it with   your prescription to the pharmacy.  - You may also stop by our office during regular business hours and pick up a GoodRx coupon card.  - If you need your prescription sent electronically to a different pharmacy, notify our office through [facility] MyChart or by phone at [phone] option 4.     Si Usted Necesita Algo Despus de Su Visita  Tambin puede enviarnos un mensaje a travs de MyChart. Por lo general respondemos a los mensajes de MyChart en el transcurso de 1 a 2  das hbiles.  Para renovar recetas, por favor pida a su farmacia que se ponga en contacto con nuestra oficina. Nuestro nmero de fax es el [phone].  Si tiene un asunto urgente cuando la clnica est cerrada y que no puede esperar hasta el siguiente da hbil, puede llamar/localizar a su doctor(a) al nmero que aparece a continuacin.   Por favor, tenga en cuenta que aunque hacemos todo lo posible para estar disponibles para asuntos urgentes fuera del horario de oficina, no estamos disponibles las 24 horas del da, los 7 das de la semana.   Si tiene un problema urgente y no puede comunicarse con nosotros, puede optar por buscar atencin mdica  en el consultorio de su doctor(a), en una clnica privada, en un centro de atencin urgente o en una sala de emergencias.  Si tiene una emergencia mdica, por favor llame inmediatamente al 911 o vaya a la sala de emergencias.  Nmeros de bper  - Dr. Kowalski: [phone]  - Dra. Moye: [phone]  - Dra. Stewart: [phone]  En caso de inclemencias del tiempo, por favor llame a nuestra lnea principal al [phone] para una actualizacin sobre el estado de cualquier retraso o cierre.  Consejos para la medicacin en dermatologa: Por favor, guarde las cajas en las que vienen los medicamentos de uso tpico para ayudarle a seguir las instrucciones sobre dnde y cmo usarlos. Las farmacias generalmente imprimen las instrucciones del medicamento slo en las cajas y no directamente en los tubos del medicamento.   Si su medicamento es muy caro, por favor, pngase en contacto con nuestra oficina llamando al [phone] y presione la opcin 4 o envenos un mensaje a travs de MyChart.   No podemos decirle cul ser su copago por los medicamentos por adelantado ya que esto es diferente dependiendo de la cobertura de su seguro. Sin embargo, es posible que podamos encontrar un medicamento sustituto a menor costo o llenar un formulario para que el  seguro cubra el medicamento que se considera necesario.   Si se requiere una autorizacin previa para que su compaa de seguros cubra su medicamento, por favor permtanos de 1 a 2 das hbiles para completar este proceso.  Los precios de los medicamentos varan con frecuencia dependiendo del lugar de dnde se surte la receta y alguna farmacias pueden ofrecer precios ms baratos.  El sitio web www.goodrx.com tiene cupones para medicamentos de diferentes farmacias. Los precios aqu no tienen en cuenta lo que podra costar con la ayuda del seguro (puede ser ms barato con su seguro), pero el sitio web puede darle el precio si no utiliz ningn seguro.  - Puede imprimir el cupn correspondiente y llevarlo con su receta a la farmacia.  - Tambin puede pasar por nuestra oficina durante el horario de atencin regular y recoger una tarjeta de cupones de GoodRx.  - Si necesita que su receta se enve electrnicamente a una farmacia diferente, informe a nuestra oficina a travs de MyChart de [place]   o por telfono llamando al [phone] y presione la opcin 4.  

## 2022-08-23 NOTE — Progress Notes (Unsigned)
Follow-Up Visit   Subjective  Brian Frederick is a 66 y.o. male who presents for the following: Actinic Keratosis (Of the face and scalp - patient did use 5FU/Calcipotriene cream BID x 1 week on the scalp but didn't noticed much of a reaction). The patient has spots, moles and lesions to be evaluated, some may be new or changing and the patient has concerns that these could be cancer.  The following portions of the chart were reviewed this encounter and updated as appropriate:   Tobacco  Allergies  Meds  Problems  Med Hx  Surg Hx  Fam Hx     Review of Systems:  No other skin or systemic complaints except as noted in HPI or Assessment and Plan.  Objective  Well appearing patient in no apparent distress; mood and affect are within normal limits.  A focused examination was performed including the face and scalp. Relevant physical exam findings are noted in the Assessment and Plan.  Scalp and face x 16 (16) Erythematous thin papules/macules with gritty scale.    Assessment & Plan  AK (actinic keratosis) (16) Scalp and face x 16  Destruction of lesion - Scalp and face x 16 Complexity: simple   Destruction method: cryotherapy   Informed consent: discussed and consent obtained   Timeout:  patient name, date of birth, surgical site, and procedure verified Lesion destroyed using liquid nitrogen: Yes   Region frozen until ice ball extended beyond lesion: Yes   Outcome: patient tolerated procedure well with no complications   Post-procedure details: wound care instructions given    fluorouracil (EFUDEX) 5 % cream - Scalp and face x 16 Apply topically 2 (two) times daily. Starting October 1st apply BID x 1 week to the scalp.  Actinic Damage - Severe, confluent actinic changes with pre-cancerous actinic keratoses  - Severe, chronic, not at goal, secondary to cumulative UV radiation exposure over time - diffuse scaly erythematous macules and papules with underlying  dyspigmentation - Discussed Prescription "Field Treatment" for Severe, Chronic Confluent Actinic Changes with Pre-Cancerous Actinic Keratoses Field treatment involves treatment of an entire area of skin that has confluent Actinic Changes (Sun/ Ultraviolet light damage) and PreCancerous Actinic Keratoses by method of PhotoDynamic Therapy (PDT) and/or prescription Topical Chemotherapy agents such as 5-fluorouracil, 5-fluorouracil/calcipotriene, and/or imiquimod.  The purpose is to decrease the number of clinically evident and subclinical PreCancerous lesions to prevent progression to development of skin cancer by chemically destroying early precancer changes that may or may not be visible.  It has been shown to reduce the risk of developing skin cancer in the treated area. As a result of treatment, redness, scaling, crusting, and open sores may occur during treatment course. One or more than one of these methods may be used and may have to be used several times to control, suppress and eliminate the PreCancerous changes. Discussed treatment course, expected reaction, and possible side effects. - Recommend daily broad spectrum sunscreen SPF 30+ to sun-exposed areas, reapply every 2 hours as needed.  - Staying in the shade or wearing long sleeves, sun glasses (UVA+UVB protection) and wide brim hats (4-inch brim around the entire circumference of the hat) are also recommended. - Call for new or changing lesions. - Starting October 1st restart 5FU/Calcipotriene cream BID x 1 week to the scalp.  Seborrheic Keratoses - Stuck-on, waxy, tan-brown papules and/or plaques  - Benign-appearing - Discussed benign etiology and prognosis. - Observe - Call for any changes  Lentigines - Scattered tan macules -  Due to sun exposure - Benign-appering, observe - Recommend daily broad spectrum sunscreen SPF 30+ to sun-exposed areas, reapply every 2 hours as needed. - Call for any changes  Return in about 6 months  (around 02/21/2023) for AK follow up .  Luther Redo, CMA, am acting as scribe for Sarina Ser, MD . Documentation: I have reviewed the above documentation for accuracy and completeness, and I agree with the above.  Sarina Ser, MD

## 2022-08-26 ENCOUNTER — Other Ambulatory Visit: Payer: Self-pay | Admitting: Nurse Practitioner

## 2022-08-27 DIAGNOSIS — H43813 Vitreous degeneration, bilateral: Secondary | ICD-10-CM | POA: Diagnosis not present

## 2022-08-27 LAB — HM DIABETES EYE EXAM

## 2022-08-28 ENCOUNTER — Encounter: Payer: Self-pay | Admitting: Dermatology

## 2022-08-28 NOTE — Telephone Encounter (Signed)
Requested Prescriptions  Pending Prescriptions Disp Refills  . Dulaglutide (TRULICITY) 1.5 HQ/1.9XJ SOPN [Pharmacy Med Name: Trulicity Subcutaneous Solution Pen-injector 1.5 MG/0.5ML] 6 mL 3    Sig: INJECT 1.5 MG (0.5ML) UNDER THE SKIN ONCE A WEEK     Endocrinology:  Diabetes - GLP-1 Receptor Agonists Passed - 08/26/2022  7:11 AM      Passed - HBA1C is between 0 and 7.9 and within 180 days    HB A1C (BAYER DCA - WAIVED)  Date Value Ref Range Status  08/08/2022 5.8 (H) 4.8 - 5.6 % Final    Comment:             Prediabetes: 5.7 - 6.4          Diabetes: >6.4          Glycemic control for adults with diabetes: <7.0          Passed - Valid encounter within last 6 months    Recent Outpatient Visits          2 weeks ago Type 2 diabetes mellitus with obesity ([place])   [place], Jolene T, NP   6 months ago Type 2 diabetes mellitus with obesity ([place])   [place], Jolene T, NP   9 months ago Type 2 diabetes mellitus with obesity ([place])   [place], Jolene T, NP   10 months ago Lab test positive for detection of COVID-19 virus   [facility] [place], Elizabeth T, NP   1 year ago 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

## 2022-08-31 ENCOUNTER — Encounter: Payer: Self-pay | Admitting: Nurse Practitioner

## 2022-09-08 ENCOUNTER — Other Ambulatory Visit: Payer: Self-pay | Admitting: Neurology

## 2022-09-08 DIAGNOSIS — G20 Parkinson's disease: Secondary | ICD-10-CM

## 2022-09-11 ENCOUNTER — Other Ambulatory Visit: Payer: Self-pay | Admitting: Neurology

## 2022-09-11 DIAGNOSIS — G20 Parkinson's disease: Secondary | ICD-10-CM

## 2022-09-25 ENCOUNTER — Ambulatory Visit
Admission: RE | Admit: 2022-09-25 | Discharge: 2022-09-25 | Disposition: A | Discharge status: Home / Self Care | Payer: Medicare Other | Attending: Gastroenterology | Admitting: Gastroenterology

## 2022-09-25 ENCOUNTER — Ambulatory Visit: Payer: Medicare Other | Admitting: Certified Registered"

## 2022-09-25 ENCOUNTER — Encounter
Admission: RE | Disposition: A | Discharge status: Home / Self Care | Payer: Self-pay | Source: Home / Self Care | Attending: Gastroenterology

## 2022-09-25 ENCOUNTER — Encounter: Payer: Self-pay | Admitting: Gastroenterology

## 2022-09-25 ENCOUNTER — Other Ambulatory Visit: Payer: Self-pay

## 2022-09-25 DIAGNOSIS — Z860100 Personal history of colon polyps, unspecified: Secondary | ICD-10-CM

## 2022-09-25 DIAGNOSIS — K219 Gastro-esophageal reflux disease without esophagitis: Secondary | ICD-10-CM | POA: Diagnosis not present

## 2022-09-25 DIAGNOSIS — Z1211 Encounter for screening for malignant neoplasm of colon: Secondary | ICD-10-CM | POA: Insufficient documentation

## 2022-09-25 DIAGNOSIS — I129 Hypertensive chronic kidney disease with stage 1 through stage 4 chronic kidney disease, or unspecified chronic kidney disease: Secondary | ICD-10-CM | POA: Diagnosis not present

## 2022-09-25 DIAGNOSIS — R7303 Prediabetes: Secondary | ICD-10-CM | POA: Diagnosis not present

## 2022-09-25 DIAGNOSIS — D124 Benign neoplasm of descending colon: Secondary | ICD-10-CM | POA: Diagnosis not present

## 2022-09-25 DIAGNOSIS — Z7984 Long term (current) use of oral hypoglycemic drugs: Secondary | ICD-10-CM | POA: Insufficient documentation

## 2022-09-25 DIAGNOSIS — Z96611 Presence of right artificial shoulder joint: Secondary | ICD-10-CM | POA: Diagnosis not present

## 2022-09-25 DIAGNOSIS — K640 First degree hemorrhoids: Secondary | ICD-10-CM | POA: Diagnosis not present

## 2022-09-25 DIAGNOSIS — N189 Chronic kidney disease, unspecified: Secondary | ICD-10-CM | POA: Diagnosis not present

## 2022-09-25 DIAGNOSIS — Z85828 Personal history of other malignant neoplasm of skin: Secondary | ICD-10-CM | POA: Diagnosis not present

## 2022-09-25 DIAGNOSIS — K5730 Diverticulosis of large intestine without perforation or abscess without bleeding: Secondary | ICD-10-CM | POA: Diagnosis not present

## 2022-09-25 DIAGNOSIS — Z96612 Presence of left artificial shoulder joint: Secondary | ICD-10-CM | POA: Diagnosis not present

## 2022-09-25 DIAGNOSIS — E785 Hyperlipidemia, unspecified: Secondary | ICD-10-CM | POA: Diagnosis not present

## 2022-09-25 DIAGNOSIS — I10 Essential (primary) hypertension: Secondary | ICD-10-CM | POA: Diagnosis not present

## 2022-09-25 HISTORY — DX: Gastro-esophageal reflux disease without esophagitis: K21.9

## 2022-09-25 HISTORY — PX: COLONOSCOPY WITH PROPOFOL: SHX5780

## 2022-09-25 HISTORY — DX: Cardiac arrhythmia, unspecified: I49.9

## 2022-09-25 LAB — GLUCOSE, CAPILLARY: Glucose-Capillary: 103 mg/dL — ABNORMAL HIGH (ref 70–99)

## 2022-09-25 SURGERY — COLONOSCOPY WITH PROPOFOL
Anesthesia: General

## 2022-09-25 MED ORDER — PROPOFOL 10 MG/ML IV BOLUS
INTRAVENOUS | Status: DC | PRN
  Administered 2022-09-25: 50 mg via INTRAVENOUS

## 2022-09-25 MED ORDER — SODIUM CHLORIDE 0.9 % IV SOLN: INTRAVENOUS | Status: DC

## 2022-09-25 MED ORDER — PROPOFOL 500 MG/50ML IV EMUL
INTRAVENOUS | Status: DC | PRN
  Administered 2022-09-25: 150 ug/kg/min via INTRAVENOUS

## 2022-09-25 MED ORDER — PHENYLEPHRINE HCL (PRESSORS) 10 MG/ML IV SOLN
INTRAVENOUS | Status: DC | PRN
  Administered 2022-09-25: 80 ug via INTRAVENOUS

## 2022-09-25 MED ORDER — LIDOCAINE HCL (CARDIAC) PF 100 MG/5ML IV SOSY
PREFILLED_SYRINGE | INTRAVENOUS | Status: DC | PRN
  Administered 2022-09-25: 50 mg via INTRAVENOUS

## 2022-09-25 MED ORDER — PROPOFOL 1000 MG/100ML IV EMUL
INTRAVENOUS | Status: AC
  Filled 2022-09-25: qty 100

## 2022-09-25 NOTE — H&P (Signed)
Brian Lame, MD [facility] [address], [place], [place] [address] Phone:[phone] Fax : [phone]  Primary Care Physician:  [name], NP Primary Gastroenterologist:  Dr. Allen Norris  Pre-Procedure History & Physical: HPI:  Brian Frederick is a 66 y.o. male is here for an colonoscopy.   Past Medical History:  Diagnosis Date   Achilles tendon disorder    Anxiety    Basal cell carcinoma 06/23/2008   Mid dorsum nose. Ulcerated.   BPH (benign prostatic hypertrophy)    Cancer (HCC)    basal cell   Chronic kidney disease    Dysrhythmia    GERD (gastroesophageal reflux disease)    Hyperlipidemia    Hypertension    Hypogonadism in male    Osteoarthritis    Prediabetes    Squamous cell carcinoma of skin 10/27/2013   Right distal lateral thigh. SCCis   SVT (supraventricular tachycardia)     Past Surgical History:  Procedure Laterality Date   BASAL CELL CARCINOMA EXCISION     CARDIAC ELECTROPHYSIOLOGY MAPPING AND ABLATION     83yr ago   cardio oblation     CHOLECYSTECTOMY     COLONOSCOPY WITH PROPOFOL N/A 04/12/2017   Procedure: COLONOSCOPY WITH PROPOFOL;  Surgeon: DLucilla Lame MD;  Location[facility]  Service: Endoscopy;  Laterality: N/A;   JOINT REPLACEMENT Left    shoulder   JOINT REPLACEMENT Right    shoulder   POLYPECTOMY N/A 04/12/2017   Procedure: POLYPECTOMY;  Surgeon: DLucilla Lame MD;  [facility]  Service: Endoscopy;  Laterality: N/A;   SHOULDER SURGERY Right 11/28/2018    Prior to Admission medications   Medication Sig Start Date End Date Taking? Authorizing Provider  atorvastatin (LIPITOR) 20 MG tablet Take 1 tablet (20 mg total) by mouth daily. 02/07/22  Yes Cannady, Jolene T, NP  carbidopa-levodopa (SINEMET IR) 25-100 MG tablet TAKE  2 at 8am/2 at noon/1 at 4pm 09/10/22  Yes Tat, RWells GuilesS, DO  lisinopril (ZESTRIL) 5 MG tablet TAKE 1 TABLET(5 MG) BY MOUTH DAILY 02/07/22  Yes Cannady, Jolene T, NP  metoprolol  succinate (TOPROL-XL) 100 MG 24 hr tablet TAKE 1 TABLET BY MOUTH EVERY DAY WITH FOOD OR IMMEDIATELY AFTER A MEAL 02/07/22  Yes Cannady, Jolene T, NP  aspirin 81 MG tablet Take 81 mg by mouth daily. Patient not taking: Reported on 08/08/2022    [provider]  BD PEN NEEDLE NANO 2ND GEN 32G X 4 MM MISC USE 1 PEN NEEDLE EVERY MORNING 07/05/21   Cannady, Jolene T, NP  blood glucose meter kit and supplies KIT Dispense based on patient and insurance preference. Use up to four times daily as directed. (FOR ICD-9 250.00, 250.01). 11/03/18   Cannady, Jolene T, NP  Dulaglutide (TRULICITY) 1.5 MHF/4.1SESOPN INJECT 1.5 MG (0.5ML) UNDER THE SKIN ONCE A WEEK 08/28/22   Cannady, Jolene T, NP  escitalopram (LEXAPRO) 10 MG tablet Take 1 tablet (10 mg total) by mouth daily. 02/07/22   Cannady, JHenrine ScrewsT, NP  fluorouracil (EFUDEX) 5 % cream Apply topically 2 (two) times daily. Starting October 1st apply BID x 1 week to the scalp. 08/23/22   KRalene Bathe MD  gabapentin (NEURONTIN) 100 MG capsule TAKE 2 CAPSULE (200 MG) BY MOUTH TWICE A DAY. 08/08/22   Cannady, Jolene T, NP  lansoprazole (PREVACID) 30 MG capsule TAKE ONE CAPSULE BY MOUTH DAILY AT 12 NOON 08/08/22   Cannady, Jolene T, NP  metFORMIN (GLUCOPHAGE-XR) 500 MG 24 hr  tablet TAKE 2 TABLETS(1000 MG) BY MOUTH TWICE DAILY. 08/08/22   [name] T, NP  Multiple Vitamin (MULTIVITAMIN) tablet Take 1 tablet by mouth daily.    [provider]  ONETOUCH VERIO test strip USE TO TEST BLOOD SUGAR UP TO FOUR TIMES DAILY AS DIRECTED 03/03/20   Cannady, Henrine Screws T, NP  sildenafil (VIAGRA) 100 MG tablet Take 0.5-1 tablets (50-100 mg total) by mouth daily as needed for erectile dysfunction. 09/28/20   Cannady, Henrine Screws T, NP  trihexyphenidyl (ARTANE) 2 MG tablet TAKE 1 TABLET(2 MG) BY MOUTH THREE TIMES DAILY WITH MEALS 09/11/22   Tat, Eustace Quail, DO    Allergies as of 08/09/2022 - Review Complete 08/08/2022  Allergen Reaction Noted   Amoxil [amoxicillin] Swelling  09/22/2015   Penicillin g benzathine Swelling 09/22/2015    Family History  Problem Relation Age of Onset   Cancer Mother        breast   Heart disease Father 28       CABG   Diabetes Father    CAD Father    Hyperlipidemia Father    Hypertension Father    Cancer Sister        breast   Liver cancer Sister    Hypothyroidism Sister    Stroke Maternal Grandfather    Diabetes Paternal Grandmother     Social History   Socioeconomic History   Marital status: Married    Spouse name: Not on file   Number of children: 3   Years of education: Not on file   Highest education level: Associate degree: occupational, Hotel manager, or vocational program  Occupational History   Occupation: retired    Comment: Dealer  Tobacco Use   Smoking status: Never   Smokeless tobacco: Never  Scientific laboratory technician Use: Never used  Substance and Sexual Activity   Alcohol use: No    Alcohol/week: 0.0 standard drinks of alcohol   Drug use: No   Sexual activity: Yes  Other Topics Concern   Not on file  Social History Narrative   Married lives with spouse   Has 3 kids biological 1 step child   Right handed   Drinks coffee 2 cups a day, sweet tea with meals, 2-3 soda's a day   Social Determinants of Health   Financial Resource Strain: Low Risk  (12/13/2021)   Overall Financial Resource Strain (CARDIA)    Difficulty of Paying Living Expenses: Not very hard  Food Insecurity: No Food Insecurity (12/13/2021)   Hunger Vital Sign    Worried About Running Out of Food in the Last Year: Never true    Ran Out of Food in the Last Year: Never true  Transportation Needs: No Transportation Needs (12/13/2021)   PRAPARE - Hydrologist (Medical): No    Lack of Transportation (Non-Medical): No  Physical Activity: Inactive (12/13/2021)   Exercise Vital Sign    Days of Exercise per Week: 0 days    Minutes of Exercise per Session: 0 min  Stress: No Stress Concern Present  (12/13/2021)   Adrian    Feeling of Stress : Only a little  Social Connections: Moderately Isolated (12/13/2021)   Social Connection and Isolation Panel [NHANES]    Frequency of Communication with Friends and Family: Three times a week    Frequency of Social Gatherings with Friends and Family: Three times a week    Attends Religious Services: Never  Active Member of Clubs or Organizations: No    Attends Archivist Meetings: Never    Marital Status: Married  Human resources officer Violence: Not At Risk (12/13/2021)   Humiliation, Afraid, Rape, and Kick questionnaire    Fear of Current or Ex-Partner: No    Emotionally Abused: No    Physically Abused: No    Sexually Abused: No    Review of Systems: See HPI, otherwise negative ROS  Physical Exam: There were no vitals taken for this visit. General:   Alert,  pleasant and cooperative in NAD Head:  Normocephalic and atraumatic. Neck:  Supple; no masses or thyromegaly. Lungs:  Clear throughout to auscultation.    Heart:  Regular rate and rhythm. Abdomen:  Soft, nontender and nondistended. Normal bowel sounds, without guarding, and without rebound.   Neurologic:  Alert and  oriented x4;  grossly normal neurologically.  Impression/Plan: COBEY RAINERI is here for an colonoscopy to be performed for a history of adenomatous polyps on 2018  Risks, benefits, limitations, and alternatives regarding  colonoscopy have been reviewed with the patient.  Questions have been answered.  All parties agreeable.   Brian Lame, MD  09/25/2022, 7:52 AM

## 2022-09-25 NOTE — Anesthesia Preprocedure Evaluation (Signed)
Anesthesia Evaluation  Patient identified by MRN, date of birth, ID band Patient awake    Reviewed: Allergy & Precautions, NPO status , Patient's Chart, lab work & pertinent test results  History of Anesthesia Complications Negative for: history of anesthetic complications  Airway Mallampati: III  TM Distance: >3 FB Neck ROM: full    Dental  (+) Teeth Intact   Pulmonary neg pulmonary ROS,    Pulmonary exam normal        Cardiovascular hypertension, On Medications + dysrhythmias (had ablation 10years ago, no repeat episodes ) Supra Ventricular Tachycardia      Neuro/Psych PSYCHIATRIC DISORDERS Anxiety Tremors noted on exam   negative neurological ROS     GI/Hepatic Neg liver ROS, GERD  Medicated,  Endo/Other  diabetes, Well Controlled, Type 2  Renal/GU Renal disease  negative genitourinary   Musculoskeletal negative musculoskeletal ROS (+)   Abdominal   Peds negative pediatric ROS (+)  Hematology negative hematology ROS (+)   Anesthesia Other Findings Past Medical History: No date: Achilles tendon disorder No date: Anxiety 06/23/2008: Basal cell carcinoma     Comment:  Mid dorsum nose. Ulcerated. No date: BPH (benign prostatic hypertrophy) No date: Cancer ([place])     Comment:  basal cell No date: Chronic kidney disease No date: Dysrhythmia No date: GERD (gastroesophageal reflux disease) No date: Hyperlipidemia No date: Hypertension No date: Hypogonadism in male No date: Osteoarthritis No date: Prediabetes 10/27/2013: Squamous cell carcinoma of skin     Comment:  Right distal lateral thigh. SCCis No date: SVT (supraventricular tachycardia)  Past Surgical History: No date: BASAL CELL CARCINOMA EXCISION No date: CARDIAC ELECTROPHYSIOLOGY MAPPING AND ABLATION     Comment:  33yr ago No date: cardio oblation No date: CHOLECYSTECTOMY 04/12/2017: COLONOSCOPY WITH PROPOFOL; N/A     Comment:  Procedure:  COLONOSCOPY WITH PROPOFOL;  Surgeon: DLucilla Lame MD;  Location[facility]  Service:               Endoscopy;  Laterality: N/A; No date: JOINT REPLACEMENT; Left     Comment:  shoulder No date: JOINT REPLACEMENT; Right     Comment:  shoulder 04/12/2017: POLYPECTOMY; N/A     Comment:  Procedure: POLYPECTOMY;  Surgeon: DLucilla Lame MD;                [facility]  Service: Endoscopy;                Laterality: N/A; 11/28/2018: SHOULDER SURGERY; Right     Reproductive/Obstetrics                             Anesthesia Physical Anesthesia Plan  ASA: 3  Anesthesia Plan: General   Post-op Pain Management: Minimal or no pain anticipated   Induction: Intravenous  PONV Risk Score and Plan: Propofol infusion and TIVA  Airway Management Planned: Natural Airway and Nasal Cannula  Additional Equipment:   Intra-op Plan:   Post-operative Plan:   Informed Consent: I have reviewed the patients History and Physical, chart, labs and discussed the procedure including the risks, benefits and alternatives for the proposed anesthesia with the patient or authorized representative who has indicated his/her understanding and acceptance.     Dental Advisory Given  Plan Discussed with: Anesthesiologist, CRNA and Surgeon  Anesthesia Plan Comments: (Patient consented for risks of anesthesia including but not limited  to:  - adverse reactions to medications - risk of airway placement if required - damage to eyes, teeth, lips or other oral mucosa - nerve damage due to positioning  - sore throat or hoarseness - Damage to heart, brain, nerves, lungs, other parts of body or loss of life  Patient voiced understanding.)        Anesthesia Quick Evaluation

## 2022-09-25 NOTE — Op Note (Signed)
[facility] Gastroenterology Patient Name: Brian Frederick Procedure Date: 09/25/2022 8:22 AM MRN: [id number] Account #: [id number] Date of Birth: June 13, 1956 Admit Type: Outpatient Age: 66 Room: [facility] ENDO ROOM 4 Gender: Male Note Status: Finalized Instrument Name: Jasper Riling [id number] Procedure:             Colonoscopy Indications:           High risk colon cancer surveillance: Personal history                         of colonic polyps Providers:             [name] MD, MD Referring MD:          Barbaraann Faster. Cannady (Referring MD) Medicines:             Propofol per Anesthesia Complications:         No immediate complications. Procedure:             Pre-Anesthesia Assessment:                        - Prior to the procedure, a History and Physical was                         performed, and patient medications and allergies were                         reviewed. The patient's tolerance of previous                         anesthesia was also reviewed. The risks and benefits                         of the procedure and the sedation options and risks                         were discussed with the patient. All questions were                         answered, and informed consent was obtained. Prior                         Anticoagulants: The patient has taken no previous                         anticoagulant or antiplatelet agents. ASA Grade                         Assessment: II - A patient with mild systemic disease.                         After reviewing the risks and benefits, the patient                         was deemed in satisfactory condition to undergo the                         procedure.  After obtaining informed consent, the colonoscope was                         passed under direct vision. Throughout the procedure,                         the patient's blood pressure, pulse, and oxygen                         saturations were  monitored continuously. The                         Colonoscope was introduced through the anus and                         advanced to the the cecum, identified by appendiceal                         orifice and ileocecal valve. The colonoscopy was                         performed without difficulty. The patient tolerated                         the procedure well. The quality of the bowel                         preparation was excellent. Findings:      The perianal and digital rectal examinations were normal.      A few small-mouthed diverticula were found in the sigmoid colon.      Non-bleeding internal hemorrhoids were found during retroflexion. The       hemorrhoids were Grade I (internal hemorrhoids that do not prolapse). Impression:            - Diverticulosis in the sigmoid colon.                        - Non-bleeding internal hemorrhoids.                        - No specimens collected. Recommendation:        - Discharge patient to home.                        - Resume previous diet.                        - Continue present medications.                        - Repeat colonoscopy in 7 years for surveillance. Procedure Code(s):     --- Professional ---                        [phone], Colonoscopy, flexible; diagnostic, including                         collection of specimen(s) by brushing or washing, when  performed (separate procedure) Diagnosis Code(s):     --- Professional ---                        Z86.010, Personal history of colonic polyps CPT copyright 2019 American Medical Association. All rights reserved. The codes documented in this report are preliminary and upon coder review may  be revised to meet current compliance requirements. [name] MD, MD 09/25/2022 8:47:00 AM This report has been signed electronically. Number of Addenda: 0 Note Initiated On: 09/25/2022 8:22 AM Scope Withdrawal Time: 0 hours 9 minutes 2 seconds  Total Procedure  Duration: 0 hours 13 minutes 4 seconds  Estimated Blood Loss:  Estimated blood loss: none.      [facility]

## 2022-09-25 NOTE — Anesthesia Postprocedure Evaluation (Signed)
Anesthesia Post Note  Patient: Brian Frederick  Procedure(s) Performed: COLONOSCOPY WITH PROPOFOL  Patient location during evaluation: PACU Anesthesia Type: General Level of consciousness: awake and alert Pain management: pain level controlled Vital Signs Assessment: post-procedure vital signs reviewed and stable Respiratory status: spontaneous breathing, nonlabored ventilation, respiratory function stable and patient connected to nasal cannula oxygen Cardiovascular status: blood pressure returned to baseline and stable Postop Assessment: no apparent nausea or vomiting Anesthetic complications: no   No notable events documented.   Last Vitals:  Vitals:   09/25/22 0859 09/25/22 0909  BP: (!) 86/63 103/74  Pulse: 86 83  Resp: 13 15  Temp:    SpO2: 95% 96%    Last Pain:  Vitals:   09/25/22 0909  TempSrc:   PainSc: 0-No pain                 Ilene Qua

## 2022-09-25 NOTE — Anesthesia Procedure Notes (Signed)
Procedure Name: MAC Date/Time: 09/25/2022 8:28 AM  Performed by: Jerrye Noble, CRNAPre-anesthesia Checklist: Patient identified, Emergency Drugs available, Suction available and Patient being monitored Patient Re-evaluated:Patient Re-evaluated prior to induction Oxygen Delivery Method: Nasal cannula

## 2022-09-25 NOTE — Transfer of Care (Signed)
Immediate Anesthesia Transfer of Care Note  Patient: Brian Frederick  Procedure(s) Performed: COLONOSCOPY WITH PROPOFOL  Patient Location: PACU and Endoscopy Unit  Anesthesia Type:General  Level of Consciousness: awake, drowsy and patient cooperative  Airway & Oxygen Therapy: Patient Spontanous Breathing  Post-op Assessment: Report given to RN and Post -op Vital signs reviewed and stable  Post vital signs: Reviewed and stable  Last Vitals:  Vitals Value Taken Time  BP 92/60 09/25/22 0850  Temp 35.6 C 09/25/22 0849  Pulse 87 09/25/22 0850  Resp 13 09/25/22 0850  SpO2 95 % 09/25/22 0850  Vitals shown include unvalidated device data.  Last Pain:  Vitals:   09/25/22 0849  TempSrc: Tympanic  PainSc: Asleep         Complications: No notable events documented.

## 2022-09-27 ENCOUNTER — Encounter: Payer: Self-pay | Admitting: Gastroenterology
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
## 2022-10-16 ENCOUNTER — Ambulatory Visit: Payer: Medicare Other | Admitting: Neurology

## 2022-10-16 ENCOUNTER — Encounter: Payer: Self-pay | Admitting: Neurology

## 2022-10-16 VITALS — BP 124/72 | HR 80 | Ht 71.0 in | Wt 246.4 lb

## 2022-10-16 DIAGNOSIS — G20A1 Parkinson's disease without dyskinesia, without mention of fluctuations: Secondary | ICD-10-CM

## 2022-10-16 NOTE — Patient Instructions (Signed)
HOLD carbidopa/levodopa until Friday AM and then either I will email you or you email me.  Let me know how the nausea does Let me know if you want a referral to Dr. Onnie Graham.  He is the shoulder surgeon at [facility]  Local and Online Resources for Power over [place]  October 2023    [place] over Parkinson's Group:    Power Over Parkinson's Patient Education Group will be Wednesday, October 11th-*Hybrid meting*- in person at [facility] location and via [facility] at 2 pm.   Upcoming Power over [employer] Meetings:  2nd Wednesdays of the month at 2 pm:   October 11th, November 8th, December 13th  Contact Amy Marriott at [email or web address] if interested in participating in this group    [place]! Moves [employer] Instructor-Led Classes offering at [employer]!  TUESDAYS and Wednesdays 1-2 pm.   Contact Vonna Kotyk at  [email or web address] or Caron Presume at [place], [email or web address]  Dance for Parkinson 's classes will be on Tuesdays 9:30am-10:30am starting October 3-December 12 with a break the week of November 21st. Located in the [employer]  which is in the first floor of the [employer] ([place] To register:  [email or web address] or [phone]  Drumming for Parkinson's will be held on 2nd and 4th Mondays at 11:00 am.   Located at the [place] ([place]  [place] at [email or web address] or [phone]  Through support from the [place] for Parkinson's classes are free for both patients and caregivers.    [employer] Parkinson's Tai Chi Class, Mondays at 11 am.  Call [phone] for details   [place]:  [email or web address]  PD Health at Home continues:  Mindfulness Mondays,  Wellness Wednesdays, Fitness Fridays   Upcoming Education:    Parkinson's 101:  What you and your family should know.  Wednesday, Oct. 4th 1-2 pm  Expert Briefing:     Parkinson's and the Gut-Brain Connection.  Wednesday, Oct. 11th 1-2 pm  Hallucinations and Delusions in Parkinson's.  Wednesday, Nov. 8th, 1-2 pm  Register for expert briefings (webinars) at [email or web address]  Please check out their website to sign up for emails and see their full online offerings      [place]:  [email or web address]   Third Thursday Webinars:  On the third Thursday of every month at 12 p.m. ET, join our free live webinars to learn about various aspects of living with Parkinson's disease and our work to speed medical breakthroughs.  Upcoming Webinar:  Surveyor, mining for [employer]. (Replay).  Thursday, Oct. 12th at 12 noon  Check out additional information on their website to see their full online offerings    [place]:  [email or web address]  Upcoming Webinar:   Stay tuned  Webinar Series:  Living with Parkinson's Meetup.   Third Thursdays each month, 3 pm  Care Partner Monthly Meetup.  With Robin Searing Phinney.  First Tuesday of each month, 2 pm  Check out additional information to Live Well Today on their website    Parkinson and Movement Disorders (PMD) Alliance:  www.[email or web address]  [employer] Online:  Online Education Events  Sign up for emails, which are sent weekly to give you updates on programming and online offerings    Parkinson's Association of the [place]:  www.[email or web address]  Information on  online support groups, education events, and online exercises including Yoga, Parkinson's exercises and more-LOTS of information on links to PD resources and online events  Virtual Support Group through Parkinson's Association of the [place]; next one  is scheduled for Wednesday, October 4th at 2 pm.  (These are typically scheduled for the 1st Wednesday of the month at 2 pm).  Visit website for details.   MOVEMENT AND EXERCISE OPPORTUNITIES  PWR! Moves Classes at [place].  Wednesdays 10 and 11 am.   Contact Amy Marriott, PT [email or web address] if interested.  NEW PWR! Moves Class offerings at [employer].  *TUESDAYS* and Wednesdays 1-2 pm.  Contact Vonna Kotyk at  [email or web address] or Caron Presume at [place],  [email or web address]  Parkinson's Wellness Recovery (PWR! Moves)  [email or web address]  Info on the PWR! Virtual Experience:  You will have access to our expertise?through self-assessment, guided plans that start with the PD-specific fundamentals, educational content, tips, Q&A with an expert, and a growing Art therapist of PD-specific pre-recorded and live exercise classes of varying types and intensity - both physical and cognitive! If that is not enough, we offer 1:1 wellness consultations (in-person or virtual) to personalize your PWR! Research scientist (medical).   [place] Fridays:   As part of the PD Health @ Home program, this free video series focuses each week on one aspect of fitness designed to support people living with Parkinson's.? These weekly videos highlight the [place] fitness guidelines for people with Parkinson's disease.  [email or web address]  Dance for PD website is offering free, live-stream classes throughout the week, as well as links to [employer] of classes:  [email or web address]  Virtual dance and Pilates for Parkinson's classes: Click on the Community Tab> Parkinson's Movement Initiative Tab.  To register for classes and for more information, visit [email or web address] and click the "community" tab.   [employer] Parkinson's Cycling Classes   [employer]:  Thursdays @ Noon-Live classes at  [employer] ([employer] at [email or web address]?or [phone])  [facility] [employer]: Virtual Classes Mondays and Thursdays Jeanette Caprice classes Tuesday, Wednesday and Thursday (contact [place] at [email or web address] ?or [phone])  [place]  Varied levels of classes are offered Tuesdays and Thursdays at [employer].   Stretching with Verdis Frederickson weekly class is also offered for people with Parkinson's  To observe a class or for more information, call [phone] or email Hezzie Bump at [email or web address]   ADDITIONAL SUPPORT AND RESOURCES  Well[employer]Spring [employer]:Online Caregiver Education Opportunities:  www.well-[email or web address]/caregiver-education/caregiver-support-group.  You may also contact Vickki Muff at [email or web address] or [phone].     Well-Spring Navigator:  Just1Navigator program, a?free service to help individuals and families through the journey of determining care for older adults.  The "Navigator" is a Education officer, museum, Arnell Asal, who will speak with a prospective client and/or loved ones to provide an assessment of the situation and a set of recommendations for a personalized care plan -- all free of charge, and whether?Well-Spring [employer] offers the needed service or not. If the need is not a service we provide, we are well-connected with reputable programs in town that we can refer you to.  www.well-springsolutions.org or to speak with the Navigator, call [phone].

## 2022-10-19 ENCOUNTER — Other Ambulatory Visit: Payer: Self-pay | Admitting: Neurology

## 2022-10-19 ENCOUNTER — Telehealth: Payer: Self-pay | Admitting: Neurology

## 2022-10-19 DIAGNOSIS — G20A1 Parkinson's disease without dyskinesia, without mention of fluctuations: Secondary | ICD-10-CM

## 2022-10-19 MED ORDER — CARBIDOPA-LEVODOPA ER 25-100 MG PO TBCR: EXTENDED_RELEASE_TABLET | ORAL | 1 refills | Status: DC

## 2022-10-19 NOTE — Telephone Encounter (Signed)
-----   Message from Hazel, DO sent at 10/16/2022 11:08 AM EDT ----- Contact patient and find out how he did with nausea when he held levodopa

## 2022-10-19 NOTE — Telephone Encounter (Signed)
Okay I sent in new RX for the CR instead.  Let pt know

## 2022-10-19 NOTE — Telephone Encounter (Signed)
Pateint said he is doing much better

## 2022-10-19 NOTE — Telephone Encounter (Addendum)
Called patient sent to PA team to see if PA is all that is needed also spoke to patient about [employer] as an option as well

## 2022-10-19 NOTE — Telephone Encounter (Signed)
Patient called to say that he was not able to get the new RX because it was not covered by insurance.

## 2022-10-25 ENCOUNTER — Other Ambulatory Visit (HOSPITAL_COMMUNITY): Payer: Self-pay

## 2022-10-26 ENCOUNTER — Telehealth: Payer: Self-pay | Admitting: Nurse Practitioner

## 2022-10-26 NOTE — Telephone Encounter (Signed)
PT dropped off paperwork to be filled out by provider for [facility] Application.  Put in provider's folder.

## 2022-10-29 NOTE — Telephone Encounter (Signed)
Paperwork has been placed in Regulatory affairs officer

## 2022-11-16 ENCOUNTER — Telehealth: Payer: Self-pay

## 2022-11-16 NOTE — Telephone Encounter (Unsigned)
Copied from [place] [phone]. Topic: General - Inquiry >> Nov 15, 2022  4:48 PM Erskine Squibb wrote: Reason for CRM: The patient called checking on the status of forms for his Trulicity that was to be fax to [facility]. He has no more of his Trulicity left and needs this as soon as possible. Please assist patient further.

## 2022-11-22 NOTE — Telephone Encounter (Signed)
Ovid Curd,  I spoke with rep from [facility] and the patient has been approved for Trulicity for 1833, he will need for his provider to call the pharmacy and give a verbal over the phone for a refill. Since he is approved for 2024 medication will not be shipped until after January 1, which after it has been shipped can take 10-14 business days for delivery.

## 2022-11-22 NOTE — Chronic Care Management (AMB) (Signed)
Spoke with Brian Frederick that I have sent message to [name] for verbal order for Trulicity to the pharmacy at [facility]. Also that his application has been approved and medication will be sent out after January 1.  [name] [phone]

## 2022-11-22 NOTE — Telephone Encounter (Signed)
The patient has called for an update on their previous request for contact  The patient shares that they would like to speak with a member of clinical staff when possible

## 2023-01-21 DIAGNOSIS — M546 Pain in thoracic spine: Secondary | ICD-10-CM | POA: Diagnosis not present

## 2023-01-21 DIAGNOSIS — M5414 Radiculopathy, thoracic region: Secondary | ICD-10-CM | POA: Diagnosis not present

## 2023-01-22 ENCOUNTER — Ambulatory Visit (INDEPENDENT_AMBULATORY_CARE_PROVIDER_SITE_OTHER): Payer: Medicare Other

## 2023-01-22 VITALS — Ht 71.0 in | Wt 246.0 lb

## 2023-01-22 DIAGNOSIS — M5414 Radiculopathy, thoracic region: Secondary | ICD-10-CM | POA: Diagnosis not present

## 2023-01-22 DIAGNOSIS — M546 Pain in thoracic spine: Secondary | ICD-10-CM | POA: Diagnosis not present

## 2023-01-22 DIAGNOSIS — Z0000 Encounter for general adult medical examination without abnormal findings: Secondary | ICD-10-CM

## 2023-01-22 NOTE — Progress Notes (Signed)
Virtual Visit via Telephone Note  I connected with  Brian Frederick on 01/22/23 at  3:30 PM EST by telephone and verified that I am speaking with the correct person using two identifiers.  Location: Patient: home Provider: [facility] Persons participating in the virtual visit: patient/Nurse Health Advisor   I discussed the limitations, risks, security and privacy concerns of performing an evaluation and management service by telephone and the availability of in person appointments. The patient expressed understanding and agreed to proceed.  Interactive audio and video telecommunications were attempted between this nurse and patient, however failed, due to patient having technical difficulties OR patient did not have access to video capability.  We continued and completed visit with audio only.  Some vital signs may be absent or patient reported.   Dionisio David, LPN  Subjective:   Brian Frederick is a 67 y.o. male who presents for Medicare Annual/Subsequent preventive examination.  Review of Systems     Cardiac Risk Factors include: advanced age (>40mn, >>22women);diabetes mellitus;hypertension;male gender     Objective:    There were no vitals filed for this visit. There is no height or weight on file to calculate BMI.     01/22/2023    3:35 PM 10/16/2022   10:56 AM 09/25/2022    7:52 AM 04/12/2022   10:55 AM 12/13/2021   11:24 AM 10/10/2021   10:51 AM 04/06/2021    3:27 PM  Advanced Directives  Does Patient Have a Medical Advance Directive? No Yes Yes Yes Yes Yes Yes  Type of Advance Directive  Living will [place] will Living will [place]   [place] Chart?   No - copy requested  No - copy requested    Would patient like information on creating a medical advance directive? No - Patient declined          Current Medications (verified) Outpatient Encounter Medications as of 01/22/2023   Medication Sig   aspirin 81 MG tablet Take 81 mg by mouth daily.   atorvastatin (LIPITOR) 20 MG tablet Take 1 tablet (20 mg total) by mouth daily.   BD PEN NEEDLE NANO 2ND GEN 32G X 4 MM MISC USE 1 PEN NEEDLE EVERY MORNING   blood glucose meter kit and supplies KIT Dispense based on patient and insurance preference. Use up to four times daily as directed. (FOR ICD-9 250.00, 250.01).   Dulaglutide (TRULICITY) 1.5 MVZ/8.5YISOPN INJECT 1.5 MG (0.5ML) UNDER THE SKIN ONCE A WEEK   escitalopram (LEXAPRO) 10 MG tablet Take 1 tablet (10 mg total) by mouth daily.   fluorouracil (EFUDEX) 5 % cream Apply topically 2 (two) times daily. Starting October 1st apply BID x 1 week to the scalp.   gabapentin (NEURONTIN) 100 MG capsule TAKE 2 CAPSULE (200 MG) BY MOUTH TWICE A DAY.   lansoprazole (PREVACID) 30 MG capsule TAKE ONE CAPSULE BY MOUTH DAILY AT 12 NOON   lisinopril (ZESTRIL) 5 MG tablet TAKE 1 TABLET(5 MG) BY MOUTH DAILY   metFORMIN (GLUCOPHAGE-XR) 500 MG 24 hr tablet TAKE 2 TABLETS(1000 MG) BY MOUTH TWICE DAILY.   metoprolol succinate (TOPROL-XL) 100 MG 24 hr tablet TAKE 1 TABLET BY MOUTH EVERY DAY WITH FOOD OR IMMEDIATELY AFTER A MEAL   Multiple Vitamin (MULTIVITAMIN) tablet Take 1 tablet by mouth daily.   ONETOUCH VERIO test strip USE TO TEST BLOOD SUGAR UP TO FOUR TIMES DAILY AS DIRECTED   trihexyphenidyl (ARTANE)  2 MG tablet TAKE 1 TABLET(2 MG) BY MOUTH THREE TIMES DAILY WITH MEALS   Carbidopa-Levodopa ER (SINEMET CR) 25-100 MG tablet controlled release 2 at 8am/2 at noon/1 at 4pm (Patient not taking: Reported on 01/22/2023)   sildenafil (VIAGRA) 100 MG tablet Take 0.5-1 tablets (50-100 mg total) by mouth daily as needed for erectile dysfunction. (Patient not taking: Reported on 01/22/2023)   No facility-administered encounter medications on file as of 01/22/2023.    Allergies (verified) Amoxil [amoxicillin] and Penicillin g benzathine   History: Past Medical History:  Diagnosis Date   Achilles  tendon disorder    Anxiety    Basal cell carcinoma 06/23/2008   Mid dorsum nose. Ulcerated.   BPH (benign prostatic hypertrophy)    Cancer (HCC)    basal cell   Chronic kidney disease    Dysrhythmia    GERD (gastroesophageal reflux disease)    Hyperlipidemia    Hypertension    Hypogonadism in male    Osteoarthritis    Prediabetes    Squamous cell carcinoma of skin 10/27/2013   Right distal lateral thigh. SCCis   SVT (supraventricular tachycardia)    Past Surgical History:  Procedure Laterality Date   BASAL CELL CARCINOMA EXCISION     CARDIAC ELECTROPHYSIOLOGY MAPPING AND ABLATION     35yr ago   cardio oblation     CHOLECYSTECTOMY     COLONOSCOPY WITH PROPOFOL N/A 04/12/2017   Procedure: COLONOSCOPY WITH PROPOFOL;  Surgeon: DLucilla Lame MD;  [facility]  Service: Endoscopy;  Laterality: N/A;   COLONOSCOPY WITH PROPOFOL N/A 09/25/2022   Procedure: COLONOSCOPY WITH PROPOFOL;  Surgeon: WLucilla Lame MD;  [facility]  Service: Endoscopy;  Laterality: N/A;   JOINT REPLACEMENT Left    shoulder   JOINT REPLACEMENT Right    shoulder   POLYPECTOMY N/A 04/12/2017   Procedure: POLYPECTOMY;  Surgeon: DLucilla Lame MD;  [facility]  Service: Endoscopy;  Laterality: N/A;   SHOULDER SURGERY Right 11/28/2018   Family History  Problem Relation Age of Onset   Cancer Mother        breast   Heart disease Father 711      CABG   Diabetes Father    CAD Father    Hyperlipidemia Father    Hypertension Father    Cancer Sister        breast   Liver cancer Sister    Hypothyroidism Sister    Stroke Maternal Grandfather    Diabetes Paternal Grandmother    Social History   Socioeconomic History   Marital status: Married    Spouse name: Not on file   Number of children: 3   Years of education: Not on file   Highest education level: Associate degree: occupational, tHotel manager or vocational program  Occupational History   Occupation:  retired    Comment: [employer] Tobacco Use   Smoking status: Never   Smokeless tobacco: Never  VScientific laboratory technicianUse: Never used  Substance and Sexual Activity   Alcohol use: No    Alcohol/week: 0.0 standard drinks of alcohol   Drug use: No   Sexual activity: Yes  Other Topics Concern   Not on file  Social History Narrative   Married lives with spouse   Has 3 kids biological 1 step child   Right handed   Drinks coffee 2 cups a day, sweet tea with meals, 2-3 soda's a day   Social Determinants of HEngineer, drilling  Resource Strain: Low Risk  (01/22/2023)   Overall Financial Resource Strain (CARDIA)    Difficulty of Paying Living Expenses: Not hard at all  Food Insecurity: No Food Insecurity (01/22/2023)   Hunger Vital Sign    Worried About Running Out of Food in the Last Year: Never true    Ran Out of Food in the Last Year: Never true  Transportation Needs: No Transportation Needs (01/22/2023)   PRAPARE - Hydrologist (Medical): No    Lack of Transportation (Non-Medical): No  Physical Activity: Insufficiently Active (01/22/2023)   Exercise Vital Sign    Days of Exercise per Week: 2 days    Minutes of Exercise per Session: 20 min  Stress: No Stress Concern Present (01/22/2023)   [place]    Feeling of Stress : Not at all  Social Connections: Socially Isolated (01/22/2023)   Social Connection and Isolation Panel [NHANES]    Frequency of Communication with Friends and Family: Twice a week    Frequency of Social Gatherings with Friends and Family: Never    Attends Religious Services: Never    Marine scientist or Organizations: No    Attends Music therapist: Never    Marital Status: Married    Tobacco Counseling Counseling given: Not Answered   Clinical Intake:  Pre-visit preparation completed: Yes  Pain : No/denies pain     Nutritional Risks: None Diabetes:  Yes CBG done?: No Did pt. bring in CBG monitor from home?: No  How often do you need to have someone help you when you read instructions, pamphlets, or other written materials from your doctor or pharmacy?: 1 - Never  Diabetic?yes Nutrition Risk Assessment:  Has the patient had any N/V/D within the last 2 months?  Yes  Does the patient have any non-healing wounds?  No  Has the patient had any unintentional weight loss or weight gain?  No   Diabetes:  Is the patient diabetic?  Yes  If diabetic, was a CBG obtained today?  No  Did the patient bring in their glucometer from home?  No  How often do you monitor your CBG's? Every morning.   Financial Strains and Diabetes Management:  Are you having any financial strains with the device, your supplies or your medication? No .  Does the patient want to be seen by Chronic Care Management for management of their diabetes?  No  Would the patient like to be referred to a Nutritionist or for Diabetic Management?  No   Diabetic Exams:  Diabetic Eye Exam: Completed 08/27/22. [name] Kitchen Pt has been advised about the importance in completing this exam.  Diabetic Foot Exam: Completed 08/08/22. Pt has been advised about the importance in completing this exam.     Interpreter Needed?: No  Information entered by :: Kirke Shaggy, LPN   Activities of Daily Living    01/22/2023    3:36 PM 08/08/2022   10:11 AM  In your present state of health, do you have any difficulty performing the following activities:  Hearing? 0 0  Vision? 0 0  Difficulty concentrating or making decisions? 0 0  Walking or climbing stairs? 0 0  Dressing or bathing? 0 0  Doing errands, shopping? 0 0  Preparing Food and eating ? N   Using the Toilet? N   In the past six months, have you accidently leaked urine? N   Do you have problems with loss  of bowel control? N   Managing your Medications? N   Managing your Finances? N   Housekeeping or managing your Housekeeping? N      Patient Care Team: [name], NP as PCP - General (Nurse Practitioner) Tat, Eustace Quail, DO as Consulting Physician (Neurology) [name], [facility] (Inactive) (Pharmacist)  Indicate any recent Medical Services you may have received from other than [facility] providers in the past year (date may be approximate).     Assessment:   This is a routine wellness examination for [place].  Hearing/Vision screen Hearing Screening - Comments:: No aids Vision Screening - Comments:: Readers- [facility]  Dietary issues and exercise activities discussed: Current Exercise Habits: Home exercise routine, Type of exercise: walking, Time (Minutes): 20, Frequency (Times/Week): 2, Weekly Exercise (Minutes/Week): 40, Intensity: Mild   Goals Addressed             This Visit's Progress    DIET - EAT MORE FRUITS AND VEGETABLES         Depression Screen    01/22/2023    3:33 PM 08/08/2022    9:32 AM 02/07/2022   10:16 AM 12/13/2021   11:28 AM 11/07/2021   11:20 AM 08/07/2021    2:19 PM 04/12/2021    9:06 AM  PHQ 2/9 Scores  PHQ - 2 Score 0 0 0 0 0 0 0  PHQ- 9 Score 0 1 2 0 2  0    Fall Risk    01/22/2023    3:35 PM 10/16/2022   10:55 AM 04/12/2022   10:55 AM 12/13/2021   11:23 AM 10/10/2021   10:51 AM  Fall Risk   Falls in the past year? 1 1 0 0 0  Number falls in past yr: 0 0 0 0 0  Injury with Fall? 0 0 0 0 0  Risk for fall due to : History of fall(s)      Follow up Falls prevention discussed;Falls evaluation completed Falls evaluation completed  Falls evaluation completed;Falls prevention discussed;Education provided     FALL RISK PREVENTION PERTAINING TO THE HOME:  Any stairs in or around the home? Yes  If so, are there any without handrails? No  Home free of loose throw rugs in walkways, pet beds, electrical cords, etc? Yes  Adequate lighting in your home to reduce risk of falls? Yes   ASSISTIVE DEVICES UTILIZED TO PREVENT FALLS:  Life alert? No  Use of a cane, walker or w/c?  No  Grab bars in the bathroom? Yes  Shower chair or bench in shower? No  Elevated toilet seat or a handicapped toilet? No    Cognitive Function:        01/22/2023    3:43 PM  6CIT Screen  What Year? 0 points  What month? 0 points  What time? 0 points  Count back from 20 0 points  Months in reverse 2 points  Repeat phrase 0 points  Total Score 2 points    Immunizations Immunization History  Administered Date(s) Administered   Fluad Quad(high Dose 65+) 11/07/2021   Influenza, High Dose Seasonal PF 09/18/2022   Influenza,inj,Quad PF,6+ Mos 09/23/2015, 09/25/2016, 09/29/2017, 09/28/2018, 09/21/2019, 09/28/2020   Influenza-Unspecified 09/29/2017, 09/28/2018   PFIZER(Purple Top)SARS-COV-2 Vaccination 03/16/2020, 04/06/2020, 10/21/2020   PNEUMOCOCCAL CONJUGATE-20 09/18/2022   Pneumococcal Conjugate-13 01/11/2021   Pneumococcal Polysaccharide-23 08/26/2013, 02/07/2022   Rsv, Bivalent, Protein Subunit Rsvpref,pf Evans Lance) 09/18/2022   Td 08/19/2002   Tdap 11/05/2012   Unspecified SARS-COV-2 Vaccination 09/18/2022   Zoster Recombinat (  Shingrix) 09/29/2017, 12/08/2017   Zoster, Live 03/23/2016    TDAP status: Due, Education has been provided regarding the importance of this vaccine. Advised may receive this vaccine at local pharmacy or Health Dept. Aware to provide a copy of the vaccination record if obtained from local pharmacy or Health Dept. Verbalized acceptance and understanding.  Flu Vaccine status: Up to date  Pneumococcal vaccine status: Up to date  Covid-19 vaccine status: Completed vaccines  Qualifies for Shingles Vaccine? Yes   Zostavax completed Yes   Shingrix Completed?: Yes  Screening Tests Health Maintenance  Topic Date Due   DTaP/Tdap/Td (3 - Td or Tdap) 11/05/2022   COVID-19 Vaccine (5 - 2023-24 season) 11/13/2022   Diabetic kidney evaluation - Urine ACR  02/07/2023   HEMOGLOBIN A1C  02/08/2023   Diabetic kidney evaluation - eGFR measurement  08/09/2023    FOOT EXAM  08/09/2023   OPHTHALMOLOGY EXAM  08/28/2023   Medicare Annual Wellness (AWV)  01/23/2024   COLONOSCOPY (Pts 45-51yr Insurance coverage will need to be confirmed)  09/25/2029   Pneumonia Vaccine 67 Years old  Completed   INFLUENZA VACCINE  Completed   Hepatitis C Screening  Completed   Zoster Vaccines- Shingrix  Completed   HPV VACCINES  Aged Out    Health Maintenance  Health Maintenance Due  Topic Date Due   DTaP/Tdap/Td (3 - Td or Tdap) 11/05/2022   COVID-19 Vaccine (5 - 2023-24 season) 11/13/2022   Diabetic kidney evaluation - Urine ACR  02/07/2023    Colorectal cancer screening: Type of screening: Colonoscopy. Completed 09/25/22. Repeat every 7 years  Lung Cancer Screening: (Low Dose CT Chest recommended if Age 68-80years, 30 pack-year currently smoking OR have quit w/in 15years.) does not qualify.    Additional Screening:  Hepatitis C Screening: does qualify; Completed 03/23/16  Vision Screening: Recommended annual ophthalmology exams for early detection of glaucoma and other disorders of the eye. Is the patient up to date with their annual eye exam?  Yes  Who is the provider or what is the name of the office in which the patient attends annual eye exams? [place] not established with a provider, would they like to be referred to a provider to establish care? No .   Dental Screening: Recommended annual dental exams for proper oral hygiene  Community Resource Referral / Chronic Care Management: CRR required this visit?  No   CCM required this visit?  No      Plan:     I have personally reviewed and noted the following in the patient's chart:   Medical and social history Use of alcohol, tobacco or illicit drugs  Current medications and supplements including opioid prescriptions. Patient is not currently taking opioid prescriptions. Functional ability and status Nutritional status Physical activity Advanced directives List of other  physicians Hospitalizations, surgeries, and ER visits in previous 12 months Vitals Screenings to include cognitive, depression, and falls Referrals and appointments  In addition, I have reviewed and discussed with patient certain preventive protocols, quality metrics, and best practice recommendations. A written personalized care plan for preventive services as well as general preventive health recommendations were provided to patient.     LDionisio David LPN   29/01/4267  Nurse Notes: none

## 2023-01-22 NOTE — Patient Instructions (Signed)
[name] , Thank you for taking time to come for your Medicare Wellness Visit. I appreciate your ongoing commitment to your health goals. Please review the following plan we discussed and let me know if I can assist you in the future.   These are the goals we discussed:  Goals       "I can't afford my diabetes medications" (pt-stated)      Current Barriers:  Financial Barriers- patient changed from a commercial BCBS plan to commercial [employer] plan this calendar year, and has had barriers in receiving medications due to formulary preferences Patient has been taking Trulicity 9.35 mg x 2 weeks and Levemir 35 units QHS, as well as switching to metformin ER 1000 mg BID. Notes GI tolerability; also notes a slight lump under a Levemir injection site, asks if this is normal. Denies affordability concerns today  Pharmacist Clinical Goal(s):  Over the next 90 days, patient will work with primary care provider and PharmD to address needs related to optimized diabetic medication management  Interventions: Encouraged to rotate insulin injection sites to reduce risk of lipohypertrophy. Encouraged to avoid lump area for injections.  Reminded of Trulicity coupon card availability if future financial concerns with copayment. He noted that cost was $25/1 month.   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   Interventions:  Collaborated with [facility] regarding patient's current cost restraints. Spoke with Brian Frederick who stated their sleep center is out of network for [employer]. Brian Frederick states OOP cost for in lab sleep study would be 750$ and an in home study would be 250$. This would not include the cpap or the supplies if patient is found to have OSA. Discussed plans with patient for ongoing care management follow up and provided patient with direct contact information for care management team Will plan to reach back out to patient and see if he would like to contact his insurance for in network provider and go from there.   Patient Self Care Activities:  Currently UNABLE TO independently to afford sleep study at out of network sleep lab  Initial goal documentation       Patient Stated      Patient would like to keep A1C down Continue current life style        This is a list of the screening recommended for you and due dates:  Health Maintenance  Topic Date Due   DTaP/Tdap/Td vaccine (3 - Td or Tdap) 11/05/2022   COVID-19 Vaccine (5 - 2023-24 season) 11/13/2022   Yearly kidney health urinalysis for diabetes  02/07/2023   Hemoglobin A1C  02/08/2023   Yearly kidney function blood test for diabetes  08/09/2023   Complete foot exam   08/09/2023   Eye exam for diabetics  08/28/2023   Medicare  Annual Wellness Visit  01/23/2024   Colon Cancer Screening  09/25/2029   Pneumonia Vaccine  Completed   Flu Shot  Completed   Hepatitis C Screening: USPSTF Recommendation to screen - Ages 18-79 yo.  Completed   Zoster (Shingles) Vaccine  Completed   HPV Vaccine  Aged Out    Advanced directives: no  Conditions/risks identified: none  Next appointment: Follow up in one year for your annual wellness visit. 01/28/24  @ 10:45 am by phone  Preventive Care 65 Years and Older, Male  Preventive care refers to lifestyle choices and visits with  your health care provider that can promote health and wellness. What does preventive care include? A yearly physical exam. This is also called an annual well check. Dental exams once or twice a year. Routine eye exams. Ask your health care provider how often you should have your eyes checked. Personal lifestyle choices, including: Daily care of your teeth and gums. Regular physical activity. Eating a healthy diet. Avoiding tobacco and drug use. Limiting alcohol use. Practicing safe sex. Taking low doses of aspirin every day. Taking vitamin and mineral supplements as recommended by your health care provider. What happens during an annual well check? The services and screenings done by your health care provider during your annual well check will depend on your age, overall health, lifestyle risk factors, and family history of disease. Counseling  Your health care provider may ask you questions about your: Alcohol use. Tobacco use. Drug use. Emotional well-being. Home and relationship well-being. Sexual activity. Eating habits. History of falls. Memory and ability to understand (cognition). Work and work Statistician. Screening  You may have the following tests or measurements: Height, weight, and BMI. Blood pressure. Lipid and cholesterol levels. These may be checked every 5 years, or more frequently if you are over 34 years old. Skin check. Lung cancer screening. You may have this screening every year starting at age 73 if you have a 30-pack-year history of smoking and currently smoke or have quit within the past 15 years. Fecal occult blood test (FOBT) of the stool. You may have this test every year starting at age 59. Flexible sigmoidoscopy or colonoscopy. You may have a sigmoidoscopy every 5 years or a colonoscopy every 10 years starting at age 40. Prostate cancer screening. Recommendations will vary depending on your family history and other risks. Hepatitis C blood  test. Hepatitis B blood test. Sexually transmitted disease (STD) testing. Diabetes screening. This is done by checking your blood sugar (glucose) after you have not eaten for a while (fasting). You may have this done every 1-3 years. Abdominal aortic aneurysm (AAA) screening. You may need this if you are a current or former smoker. Osteoporosis. You may be screened starting at age 84 if you are at high risk. Talk with your health care provider about your test results, treatment options, and if necessary, the need for more tests. Vaccines  Your health care provider may recommend certain vaccines, such as: Influenza vaccine. This is recommended every year. Tetanus, diphtheria, and acellular pertussis (Tdap, Td) vaccine. You may need a Td booster every 10 years. Zoster vaccine. You may need this after age 14. Pneumococcal 13-valent conjugate (PCV13) vaccine. One dose is recommended after age 26. Pneumococcal polysaccharide (PPSV23) vaccine. One dose is recommended after age 20. Talk to your health care provider about which screenings and vaccines you need and how often you need them. This information is not intended to replace advice given to you by your health  care provider. Make sure you discuss any questions you have with your health care provider. Document Released: 12/30/2015 Document Revised: 08/22/2016 Document Reviewed: 10/04/2015 Elsevier Interactive Patient Education  2017 [place] Prevention in the Home Falls can cause injuries. They can happen to people of all ages. There are many things you can do to make your home safe and to help prevent falls. What can I do on the outside of my home? Regularly fix the edges of walkways and driveways and fix any cracks. Remove anything that might make you trip as you walk through a door, such as a raised step or threshold. Trim any bushes or trees on the path to your home. Use bright outdoor lighting. Clear any walking paths of  anything that might make someone trip, such as rocks or tools. Regularly check to see if handrails are loose or broken. Make sure that both sides of any steps have handrails. Any raised decks and porches should have guardrails on the edges. Have any leaves, snow, or ice cleared regularly. Use sand or salt on walking paths during winter. Clean up any spills in your garage right away. This includes oil or grease spills. What can I do in the bathroom? Use night lights. Install grab bars by the toilet and in the tub and shower. Do not use towel bars as grab bars. Use non-skid mats or decals in the tub or shower. If you need to sit down in the shower, use a plastic, non-slip stool. Keep the floor dry. Clean up any water that spills on the floor as soon as it happens. Remove soap buildup in the tub or shower regularly. Attach bath mats securely with double-sided non-slip rug tape. Do not have throw rugs and other things on the floor that can make you trip. What can I do in the bedroom? Use night lights. Make sure that you have a light by your bed that is easy to reach. Do not use any sheets or blankets that are too big for your bed. They should not hang down onto the floor. Have a firm chair that has side arms. You can use this for support while you get dressed. Do not have throw rugs and other things on the floor that can make you trip. What can I do in the kitchen? Clean up any spills right away. Avoid walking on wet floors. Keep items that you use a lot in easy-to-reach places. If you need to reach something above you, use a strong step stool that has a grab bar. Keep electrical cords out of the way. Do not use floor polish or wax that makes floors slippery. If you must use wax, use non-skid floor wax. Do not have throw rugs and other things on the floor that can make you trip. What can I do with my stairs? Do not leave any items on the stairs. Make sure that there are handrails on both  sides of the stairs and use them. Fix handrails that are broken or loose. Make sure that handrails are as long as the stairways. Check any carpeting to make sure that it is firmly attached to the stairs. Fix any carpet that is loose or worn. Avoid having throw rugs at the top or bottom of the stairs. If you do have throw rugs, attach them to the floor with carpet tape. Make sure that you have a light switch at the top of the stairs and the bottom of the stairs. If you do not  have them, ask someone to add them for you. What else can I do to help prevent falls? Wear shoes that: Do not have high heels. Have rubber bottoms. Are comfortable and fit you well. Are closed at the toe. Do not wear sandals. If you use a stepladder: Make sure that it is fully opened. Do not climb a closed stepladder. Make sure that both sides of the stepladder are locked into place. Ask someone to hold it for you, if possible. Clearly mark and make sure that you can see: Any grab bars or handrails. First and last steps. Where the edge of each step is. Use tools that help you move around (mobility aids) if they are needed. These include: Canes. Walkers. Scooters. Crutches. Turn on the lights when you go into a dark area. Replace any light bulbs as soon as they burn out. Set up your furniture so you have a clear path. Avoid moving your furniture around. If any of your floors are uneven, fix them. If there are any pets around you, be aware of where they are. Review your medicines with your doctor. Some medicines can make you feel dizzy. This can increase your chance of falling. Ask your doctor what other things that you can do to help prevent falls. This information is not intended to replace advice given to you by your health care provider. Make sure you discuss any questions you have with your health care provider. Document Released: 09/29/2009 Document Revised: 05/10/2016 Document Reviewed: 01/07/2015 Elsevier  Interactive Patient Education  2017 [employer].

## 2023-01-24 DIAGNOSIS — M546 Pain in thoracic spine: Secondary | ICD-10-CM | POA: Diagnosis not present

## 2023-01-24 DIAGNOSIS — M5414 Radiculopathy, thoracic region: Secondary | ICD-10-CM | POA: Diagnosis not present

## 2023-01-28 DIAGNOSIS — M546 Pain in thoracic spine: Secondary | ICD-10-CM | POA: Diagnosis not present

## 2023-01-28 DIAGNOSIS — M5414 Radiculopathy, thoracic region: Secondary | ICD-10-CM | POA: Diagnosis not present

## 2023-01-31 DIAGNOSIS — M5414 Radiculopathy, thoracic region: Secondary | ICD-10-CM | POA: Diagnosis not present

## 2023-01-31 DIAGNOSIS — M546 Pain in thoracic spine: Secondary | ICD-10-CM | POA: Diagnosis not present

## 2023-02-07 DIAGNOSIS — M5414 Radiculopathy, thoracic region: Secondary | ICD-10-CM | POA: Diagnosis not present

## 2023-02-07 DIAGNOSIS — M546 Pain in thoracic spine: Secondary | ICD-10-CM | POA: Diagnosis not present

## 2023-02-08 ENCOUNTER — Encounter: Payer: Self-pay | Admitting: Nurse Practitioner

## 2023-02-08 ENCOUNTER — Ambulatory Visit (INDEPENDENT_AMBULATORY_CARE_PROVIDER_SITE_OTHER): Payer: Medicare Other | Admitting: Nurse Practitioner

## 2023-02-08 VITALS — BP 128/87 | HR 63 | Temp 98.2°F | Ht 70.98 in | Wt 251.9 lb

## 2023-02-08 DIAGNOSIS — E6609 Other obesity due to excess calories: Secondary | ICD-10-CM

## 2023-02-08 DIAGNOSIS — G20A1 Parkinson's disease without dyskinesia, without mention of fluctuations: Secondary | ICD-10-CM | POA: Diagnosis not present

## 2023-02-08 DIAGNOSIS — E785 Hyperlipidemia, unspecified: Secondary | ICD-10-CM

## 2023-02-08 DIAGNOSIS — F419 Anxiety disorder, unspecified: Secondary | ICD-10-CM

## 2023-02-08 DIAGNOSIS — E1159 Type 2 diabetes mellitus with other circulatory complications: Secondary | ICD-10-CM | POA: Diagnosis not present

## 2023-02-08 DIAGNOSIS — E1169 Type 2 diabetes mellitus with other specified complication: Secondary | ICD-10-CM

## 2023-02-08 DIAGNOSIS — Z6833 Body mass index (BMI) 33.0-33.9, adult: Secondary | ICD-10-CM

## 2023-02-08 DIAGNOSIS — E669 Obesity, unspecified: Secondary | ICD-10-CM

## 2023-02-08 LAB — BAYER DCA HB A1C WAIVED: HB A1C (BAYER DCA - WAIVED): 6.3 % — ABNORMAL HIGH (ref 4.8–5.6)

## 2023-02-08 LAB — MICROALBUMIN, URINE WAIVED
Creatinine, Urine Waived: 300 mg/dL (ref 10–300)
Microalb, Ur Waived: 80 mg/L — ABNORMAL HIGH (ref 0–19)

## 2023-02-08 MED ORDER — TRULICITY 3 MG/0.5ML ~~LOC~~ SOAJ: 3.0000 mg | SUBCUTANEOUS | 4 refills | Status: DC

## 2023-02-08 NOTE — Assessment & Plan Note (Signed)
BMI 35.15 with some further loss with T2DM, HTN/HLD -- praised for success.  Recommended eating smaller high protein, low fat meals more frequently and exercising 30 mins a day 5 times a week with a goal of 10-15lb weight loss in the next 3 months. Patient voiced their understanding and motivation to adhere to these recommendations.

## 2023-02-08 NOTE — Assessment & Plan Note (Signed)
Chronic, ongoing with A1c today 6.3%, mild trend up. Will increase Trulicity to 3 MG and continue Metformin XR at 500 MG daily for ongoing diabetes control and weight loss support.  Urine ALB 80, continue ACE daily.  Check BS 2-3 times a day and document + continue heavy focus on diet.  May benefit addition of SGLT2 for cardiac health in future to maintain A1c goal if elevations - with goal to maintain off insulin. CCM involved with patient.  Return in 6 months for A1c check and physical.

## 2023-02-08 NOTE — Assessment & Plan Note (Signed)
Chronic, stable on Lexapro.  Continue low dose medication and monitor.  Denies SI/HI.

## 2023-02-08 NOTE — Assessment & Plan Note (Addendum)
Chronic.  Followed by neurology, continue collaboration and current medication regimen as prescribed by them.  Recent note reviewed and labs.

## 2023-02-08 NOTE — Assessment & Plan Note (Signed)
Chronic, ongoing.  Continue current medication regimen and adjust as needed.  Lipid panel today, recent LDL < 70.

## 2023-02-08 NOTE — Progress Notes (Signed)
BP 128/87   Pulse 63   Temp 98.2 F (36.8 C) (Oral)   Ht 5' 10.98" (1.803 m)   Wt 251 lb 14.4 oz (114.3 kg)   SpO2 98%   BMI 35.15 kg/m    Subjective:    Patient ID: Brian Frederick, male    DOB: Mar 08, 1956, 67 y.o.   MRN: [id number]  HPI: Brian Frederick is a 67 y.o. male  Chief Complaint  Patient presents with   Diabetes   Hypertension   Hyperlipidemia   DIABETES A1c 5.8% in August.  Taking Metformin XR  500 MG daily (has been cutting back) and Trulicity 1.5 MG. Has been off insulin for over one year now. Takes Gabapentin 200 MG BID for neuropathy. Hypoglycemic episodes:no Polydipsia/polyuria: no Visual disturbance: no Chest pain: no Paresthesias: no Glucose Monitoring: yes             Accucheck frequency: Daily             Fasting glucose: <130             Post prandial:             Evening:             Before meals: Taking Insulin?:no             Long acting insulin:              Short acting insulin: Blood Pressure Monitoring: weekly Retinal Examination: Up to Date -- [facility] Foot Exam: Up to Date Pneumovax: Up to Date Influenza: Up to Date Aspirin: yes   HYPERTENSION / HYPERLIPIDEMIA Taking Lisinopril 5 MG daily, Metoprolol 100 MG daily, and Lipitor 20 MG + ASA. Satisfied with current treatment? yes Duration of hypertension: chronic BP monitoring frequency: not checking BP range:  BP medication side effects: no Duration of hyperlipidemia: chronic Cholesterol medication side effects: no Cholesterol supplements: none Medication compliance: good compliance Aspirin: yes Recent stressors: no Recurrent headaches: no Visual changes: no Palpitations: no Dyspnea: no Chest pain: no Lower extremity edema: no Dizzy/lightheaded: no   PARKINSON'S DISEASE: Diagnosed in 2015.  Follows with neurology, Dr. Carles Collet. Denies any recent falls or decline in function.  Reports good control on current regimen.  Saw Dr. Carles Collet last on 10/16/22 they are cutting back on  Sinemet due to diarrhea.  Continues on Artane. Goes to dermatology, last visit 08/23/22. Has done physical therapy in past with benefit.   ANXIETY/STRESS Taking Lexapro 10 MG daily. Duration:controlled Anxious mood: no  Excessive worrying: no Irritability: no  Sweating: no Nausea: no Palpitations:no Hyperventilation: no Panic attacks: no Agoraphobia: no  Obscessions/compulsions: no Depressed mood: no Anhedonia: no Weight changes: no Insomnia: yes hard to stay asleep  Hypersomnia: no Fatigue/loss of energy: no Feelings of worthlessness: no Feelings of guilt: no Impaired concentration/indecisiveness: no Suicidal ideations: no  Crying spells: no Recent Stressors/Life Changes: no   Relationship problems: no   Family stress: no     Financial stress: no    Job stress: no    Recent death/loss: no    01-30-23    3:33 PM 08/08/2022    9:32 AM 02/07/2022   10:16 AM 12/13/2021   11:28 AM 11/07/2021   11:20 AM  Depression screen PHQ 2/9  Decreased Interest 0 0 0 0 0  Down, Depressed, Hopeless 0 0 0 0 0  PHQ - 2 Score 0 0 0 0 0  Altered sleeping 0 0 0 0 1  Tired, decreased energy  0 1 1 0 1  Change in appetite 0 0 0 0 0  Feeling bad or failure about yourself  0 0 0 0 0  Trouble concentrating 0 0 0 0 0  Moving slowly or fidgety/restless 0 0 1  0  Suicidal thoughts 0 0 0  0  PHQ-9 Score 0 1 2 0 2  Difficult doing work/chores Not difficult at all Not difficult at all Not difficult at all         02/08/2023    9:04 AM 08/08/2022    9:33 AM 02/07/2022   10:16 AM 11/07/2021   11:21 AM  GAD 7 : Generalized Anxiety Score  Nervous, Anxious, on Edge 0 0 0 0  Control/stop worrying 0 0 0 0  Worry too much - different things 0 0 0 0  Trouble relaxing 0 0 0 0  Restless 0 0 1 0  Easily annoyed or irritable 0 0 0 0  Afraid - awful might happen 0 0 0 0  Total GAD 7 Score 0 0 1 0  Anxiety Difficulty  Not difficult at all Not difficult at all    Relevant past medical, surgical, family  and social history reviewed and updated as indicated. Interim medical history since our last visit reviewed. Allergies and medications reviewed and updated.  Review of Systems  Constitutional:  Negative for activity change, diaphoresis, fatigue and fever.  Respiratory:  Negative for cough, chest tightness, shortness of breath and wheezing.   Cardiovascular:  Negative for chest pain, palpitations and leg swelling.  Gastrointestinal: Negative.   Endocrine: Negative for polydipsia, polyphagia and polyuria.  Neurological: Negative.   Psychiatric/Behavioral: Negative.      Per HPI unless specifically indicated above     Objective:    BP 128/87   Pulse 63   Temp 98.2 F (36.8 C) (Oral)   Ht 5' 10.98" (1.803 m)   Wt 251 lb 14.4 oz (114.3 kg)   SpO2 98%   BMI 35.15 kg/m   Wt Readings from Last 3 Encounters:  02/08/23 251 lb 14.4 oz (114.3 kg)  01/22/23 246 lb (111.6 kg)  10/16/22 246 lb 6.4 oz (111.8 kg)    Physical Exam Vitals and nursing note reviewed.  Constitutional:      General: He is awake. He is not in acute distress.    Appearance: He is well-developed and well-groomed. He is morbidly obese. He is not ill-appearing.  HENT:     Head: Normocephalic and atraumatic.     Right Ear: Hearing normal. No drainage.     Left Ear: Hearing normal. No drainage.  Eyes:     General: Lids are normal.        Right eye: No discharge.        Left eye: No discharge.     Conjunctiva/sclera: Conjunctivae normal.     Pupils: Pupils are equal, round, and reactive to light.  Neck:     Thyroid: No thyromegaly.     Vascular: No carotid bruit.  Cardiovascular:     Rate and Rhythm: Normal rate and regular rhythm.     Heart sounds: Normal heart sounds, S1 normal and S2 normal. No murmur heard.    No gallop.  Pulmonary:     Effort: Pulmonary effort is normal. No accessory muscle usage or respiratory distress.     Breath sounds: Normal breath sounds.  Abdominal:     General: Bowel sounds  are normal.     Palpations: Abdomen is soft.  Musculoskeletal:  General: Normal range of motion.     Cervical back: Normal range of motion and neck supple.     Right lower leg: No edema.     Left lower leg: No edema.  Skin:    General: Skin is warm and dry.     Capillary Refill: Capillary refill takes less than 2 seconds.  Neurological:     Mental Status: He is alert and oriented to person, place, and time.     Motor: Tremor present.  Psychiatric:        Attention and Perception: Attention normal.        Mood and Affect: Mood normal.        Speech: Speech normal.        Behavior: Behavior normal. Behavior is cooperative.        Thought Content: Thought content normal.    Results for orders placed or performed during the hospital encounter of 09/25/22  Glucose, capillary  Result Value Ref Range   Glucose-Capillary 103 (H) 70 - 99 mg/dL      Assessment & Plan:   Problem List Items Addressed This Visit       Cardiovascular and Mediastinum   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HCC)    Chronic ongoing, A1c today 6.3%, mild trend up. Will increase Trulicity to 3 MG and continue Metformin XR at 500 MG daily for ongoing diabetes control and weight loss support.  Check BS 2-3 times a day and document + continue heavy focus on diet.  Return in 6 months. - Foot and eye exam up to date - Pneumococcal vaccines up to date. - On ACE and taking a statin      Relevant Medications   Dulaglutide (TRULICITY) 3 0000000 SOPN   Other Relevant Orders   Bayer DCA Hb A1c Waived   Microalbumin, Urine Waived   Comprehensive metabolic panel     Endocrine   Hyperlipidemia associated with type 2 diabetes mellitus (HCC)    Chronic, ongoing.  Continue current medication regimen and adjust as needed.  Lipid panel today, recent LDL < 70.      Relevant Medications   Dulaglutide (TRULICITY) 3 0000000 SOPN   Other Relevant Orders   Bayer DCA Hb A1c Waived   Comprehensive  metabolic panel   Lipid Panel w/o Chol/HDL Ratio   Type 2 diabetes mellitus with obesity (HCC) - Primary    Chronic, ongoing with A1c today 6.3%, mild trend up. Will increase Trulicity to 3 MG and continue Metformin XR at 500 MG daily for ongoing diabetes control and weight loss support.  Urine ALB 80, continue ACE daily.  Check BS 2-3 times a day and document + continue heavy focus on diet.  May benefit addition of SGLT2 for cardiac health in future to maintain A1c goal if elevations - with goal to maintain off insulin. CCM involved with patient.  Return in 6 months for A1c check and physical.      Relevant Medications   Dulaglutide (TRULICITY) 3 0000000 SOPN   Other Relevant Orders   Bayer DCA Hb A1c Waived   Microalbumin, Urine Waived   Comprehensive metabolic panel     Nervous and Auditory   Parkinson's disease without dyskinesia or fluctuating manifestations    Chronic.  Followed by neurology, continue collaboration and current medication regimen as prescribed by them.  Recent note reviewed and labs.        Other   Chronic anxiety    Chronic, stable on Lexapro.  Continue low dose medication and monitor.  Denies SI/HI.      Obesity    BMI 35.15 with some further loss with T2DM, HTN/HLD -- praised for success.  Recommended eating smaller high protein, low fat meals more frequently and exercising 30 mins a day 5 times a week with a goal of 10-15lb weight loss in the next 3 months. Patient voiced their understanding and motivation to adhere to these recommendations.       Relevant Medications   Dulaglutide (TRULICITY) 3 0000000 SOPN     Follow up plan: Return in about 6 months (around 08/09/2023) for Annual physical and diabetes check -- after 08/09/23.

## 2023-02-08 NOTE — Patient Instructions (Signed)
Diabetes Mellitus Basics  Diabetes mellitus, or diabetes, is a long-term (chronic) disease. It occurs when the body does not properly use sugar (glucose) that is released from food after you eat. Diabetes mellitus may be caused by one or both of these problems: Your pancreas does not make enough of a hormone called insulin. Your body does not react in a normal way to the insulin that it makes. Insulin lets glucose enter cells in your body. This gives you energy. If you have diabetes, glucose cannot get into cells. This causes high blood glucose (hyperglycemia). How to treat and manage diabetes You may need to take insulin or other diabetes medicines daily to keep your glucose in balance. If you are prescribed insulin, you will learn how to give yourself insulin by injection. You may need to adjust the amount of insulin you take based on the foods that you eat. You will need to check your blood glucose levels using a glucose monitor as told by your health care provider. The readings can help determine if you have low or high blood glucose. Generally, you should have these blood glucose levels: Before meals (preprandial): 80-130 mg/dL (4.4-7.2 mmol/L). After meals (postprandial): below 180 mg/dL (10 mmol/L). Hemoglobin A1c (HbA1c) level: less than 7%. Your health care provider will set treatment goals for you. Keep all follow-up visits. This is important. Follow these instructions at home: Diabetes medicines Take your diabetes medicines every day as told by your health care provider. List your diabetes medicines here: Name of medicine: ______________________________ Amount (dose): _______________ Time (a.m./p.m.): _______________ Notes: ___________________________________ Name of medicine: ______________________________ Amount (dose): _______________ Time (a.m./p.m.): _______________ Notes: ___________________________________ Name of medicine: ______________________________ Amount (dose):  _______________ Time (a.m./p.m.): _______________ Notes: ___________________________________ Insulin If you use insulin, list the types of insulin you use here: Insulin type: ______________________________ Amount (dose): _______________ Time (a.m./p.m.): _______________Notes: ___________________________________ Insulin type: ______________________________ Amount (dose): _______________ Time (a.m./p.m.): _______________ Notes: ___________________________________ Insulin type: ______________________________ Amount (dose): _______________ Time (a.m./p.m.): _______________ Notes: ___________________________________ Insulin type: ______________________________ Amount (dose): _______________ Time (a.m./p.m.): _______________ Notes: ___________________________________ Insulin type: ______________________________ Amount (dose): _______________ Time (a.m./p.m.): _______________ Notes: ___________________________________ Managing blood glucose  Check your blood glucose levels using a glucose monitor as told by your health care provider. Write down the times that you check your glucose levels here: Time: _______________ Notes: ___________________________________ Time: _______________ Notes: ___________________________________ Time: _______________ Notes: ___________________________________ Time: _______________ Notes: ___________________________________ Time: _______________ Notes: ___________________________________ Time: _______________ Notes: ___________________________________  Low blood glucose Low blood glucose (hypoglycemia) is when glucose is at or below 70 mg/dL (3.9 mmol/L). Symptoms may include: Feeling: Hungry. Sweaty and clammy. Irritable or easily upset. Dizzy. Sleepy. Having: A fast heartbeat. A headache. A change in your vision. Numbness around the mouth, lips, or tongue. Having trouble with: Moving (coordination). Sleeping. Treating low blood glucose To treat low blood  glucose, eat or drink something containing sugar right away. If you can think clearly and swallow safely, follow the 15:15 rule: Take 15 grams of a fast-acting carb (carbohydrate), as told by your health care provider. Some fast-acting carbs are: Glucose tablets: take 3-4 tablets. Hard candy: eat 3-5 pieces. Fruit juice: drink 4 oz (120 mL). Regular (not diet) soda: drink 4-6 oz (120-180 mL). Honey or sugar: eat 1 Tbsp (15 mL). Check your blood glucose levels 15 minutes after you take the carb. If your glucose is still at or below 70 mg/dL (3.9 mmol/L), take 15 grams of a carb again. If your glucose does not go above 70 mg/dL (3.9 mmol/L) after   3 tries, get help right away. After your glucose goes back to normal, eat a meal or a snack within 1 hour. Treating very low blood glucose If your glucose is at or below 54 mg/dL (3 mmol/L), you have very low blood glucose (severe hypoglycemia). This is an emergency. Do not wait to see if the symptoms will go away. Get medical help right away. Call your local emergency services (911 in the [place] Do not drive yourself to the hospital. Questions to ask your health care provider Should I talk with a diabetes educator? What equipment will I need to care for myself at home? What diabetes medicines do I need? When should I take them? How often do I need to check my blood glucose levels? What number can I call if I have questions? When is my follow-up visit? Where can I find a support group for people with diabetes? Where to find more information American Diabetes Association: www.diabetes.org Association of Diabetes Care and Education Specialists: www.diabeteseducator.org Contact a health care provider if: Your blood glucose is at or above 240 mg/dL (13.3 mmol/L) for 2 days in a row. You have been sick or have had a fever for 2 days or more, and you are not getting better. You have any of these problems for more than 6 hours: You cannot eat or  drink. You feel nauseous. You vomit. You have diarrhea. Get help right away if: Your blood glucose is lower than 54 mg/dL (3 mmol/L). You get confused. You have trouble thinking clearly. You have trouble breathing. These symptoms may represent a serious problem that is an emergency. Do not wait to see if the symptoms will go away. Get medical help right away. Call your local emergency services (911 in the [place] Do not drive yourself to the hospital. Summary Diabetes mellitus is a chronic disease that occurs when the body does not properly use sugar (glucose) that is released from food after you eat. Take insulin and diabetes medicines as told. Check your blood glucose every day, as often as told. Keep all follow-up visits. This is important. This information is not intended to replace advice given to you by your health care provider. Make sure you discuss any questions you have with your health care provider. Document Revised: 04/05/2020 Document Reviewed: 04/05/2020 Elsevier Patient Education  2023 Elsevier Inc.  

## 2023-02-08 NOTE — Assessment & Plan Note (Addendum)
Chronic ongoing, A1c today 6.3%, mild trend up. Will increase Trulicity to 3 MG and continue Metformin XR at 500 MG daily for ongoing diabetes control and weight loss support.  Check BS 2-3 times a day and document + continue heavy focus on diet.  Return in 6 months. - Foot and eye exam up to date - Pneumococcal vaccines up to date. - On ACE and taking a statin

## 2023-02-09 ENCOUNTER — Other Ambulatory Visit: Payer: Self-pay | Admitting: Nurse Practitioner

## 2023-02-09 LAB — COMPREHENSIVE METABOLIC PANEL
ALT: 23 IU/L (ref 0–44)
AST: 18 IU/L (ref 0–40)
Albumin/Globulin Ratio: 2.7 — ABNORMAL HIGH (ref 1.2–2.2)
Albumin: 4.8 g/dL (ref 3.9–4.9)
Alkaline Phosphatase: 88 IU/L (ref 44–121)
BUN/Creatinine Ratio: 16 (ref 10–24)
BUN: 18 mg/dL (ref 8–27)
Bilirubin Total: 0.6 mg/dL (ref 0.0–1.2)
CO2: 26 mmol/L (ref 20–29)
Calcium: 9.1 mg/dL (ref 8.6–10.2)
Chloride: 104 mmol/L (ref 96–106)
Creatinine, Ser: 1.16 mg/dL (ref 0.76–1.27)
Globulin, Total: 1.8 g/dL (ref 1.5–4.5)
Glucose: 105 mg/dL — ABNORMAL HIGH (ref 70–99)
Potassium: 4.2 mmol/L (ref 3.5–5.2)
Sodium: 144 mmol/L (ref 134–144)
Total Protein: 6.6 g/dL (ref 6.0–8.5)
eGFR: 69 mL/min/{1.73_m2} (ref >59)

## 2023-02-09 LAB — LIPID PANEL W/O CHOL/HDL RATIO
Cholesterol, Total: 131 mg/dL (ref 100–199)
HDL: 33 mg/dL — ABNORMAL LOW (ref >39)
LDL Chol Calc (NIH): 65 mg/dL (ref 0–99)
Triglycerides: 197 mg/dL — ABNORMAL HIGH (ref 0–149)
VLDL Cholesterol Cal: 33 mg/dL (ref 5–40)

## 2023-02-09 MED ORDER — OMEGA-3-ACID ETHYL ESTERS 1 G PO CAPS: 1.0000 g | ORAL_CAPSULE | Freq: Two times a day (BID) | ORAL | 12 refills | Status: DC

## 2023-02-09 NOTE — Progress Notes (Signed)
Contacted via [place] morning [place], your labs have returned: - Cholesterol levels show stable LDL (bad cholesterol), but triglycerides remain elevated.  I recommend you start an Omega 3 supplement daily to help lower these, I will send this in. - Kidney function, creatinine and eGFR, remains normal, as is liver function, AST and ALT.  Overall stable labs.  Any questions? Keep being amazing!!  Thank you for allowing me to participate in your care.  I appreciate you. Kindest regards, Rotunda Worden

## 2023-02-11 ENCOUNTER — Telehealth: Payer: Self-pay

## 2023-02-11 NOTE — Telephone Encounter (Signed)
Copied from [place] [phone]. Topic: General - Other >> Feb 08, 2023  3:04 PM Everette C wrote: Reason for CRM: The patient has called to request that their Dulaglutide (TRULICITY) 3 0000000 SOPN [id number] prescription be submitted to the [facility] program for full cost coverage  Please contact the patient further if needed

## 2023-02-11 NOTE — Telephone Encounter (Unsigned)
Copied from [place] [phone]. Topic: General - Other >> Feb 08, 2023  3:04 PM Everette C wrote: Reason for CRM: The patient has called to request that their Dulaglutide (TRULICITY) 3 0000000 SOPN [id number] prescription be submitted to the [facility] program for full cost coverage  Please contact the patient further if needed

## 2023-02-14 DIAGNOSIS — M5414 Radiculopathy, thoracic region: Secondary | ICD-10-CM | POA: Diagnosis not present

## 2023-02-14 DIAGNOSIS — M546 Pain in thoracic spine: Secondary | ICD-10-CM | POA: Diagnosis not present

## 2023-02-21 ENCOUNTER — Ambulatory Visit: Payer: Medicare Other | Admitting: Dermatology

## 2023-02-21 VITALS — BP 137/87 | HR 77

## 2023-02-21 DIAGNOSIS — L814 Other melanin hyperpigmentation: Secondary | ICD-10-CM

## 2023-02-21 DIAGNOSIS — L578 Other skin changes due to chronic exposure to nonionizing radiation: Secondary | ICD-10-CM | POA: Diagnosis not present

## 2023-02-21 DIAGNOSIS — L821 Other seborrheic keratosis: Secondary | ICD-10-CM

## 2023-02-21 DIAGNOSIS — Z79899 Other long term (current) drug therapy: Secondary | ICD-10-CM | POA: Diagnosis not present

## 2023-02-21 DIAGNOSIS — L570 Actinic keratosis: Secondary | ICD-10-CM

## 2023-02-21 DIAGNOSIS — Z5111 Encounter for antineoplastic chemotherapy: Secondary | ICD-10-CM | POA: Diagnosis not present

## 2023-02-21 MED ORDER — FLUOROURACIL 5 % EX CREA: TOPICAL_CREAM | Freq: Two times a day (BID) | CUTANEOUS | 1 refills | Status: DC

## 2023-02-21 NOTE — Progress Notes (Signed)
   Follow-Up Visit   Subjective  Brian Frederick is a 67 y.o. male who presents for the following: Actinic Keratosis (Recheck sun exposed areas for precancerous skin lesions today). The patient has spots, moles and lesions to be evaluated, some may be new or changing and the patient has concerns that these could be cancer.  The following portions of the chart were reviewed this encounter and updated as appropriate:   Tobacco  Allergies  Meds  Problems  Med Hx  Surg Hx  Fam Hx     Review of Systems:  No other skin or systemic complaints except as noted in HPI or Assessment and Plan.  Objective  Well appearing patient in no apparent distress; mood and affect are within normal limits.  A focused examination was performed including the face, scalp, ears, neck, arms, and hands. Relevant physical exam findings are noted in the Assessment and Plan.  Scalp Erythematous thin papules/macules with gritty scale.    Assessment & Plan  AK (actinic keratosis) Scalp  early Aks - restart 5FU/Calcipotriene cream BID x 10 days to the scalp, forehead, and temples. Related Medications fluorouracil (EFUDEX) 5 % cream Apply topically 2 (two) times daily. Apply to the forehead, temples, and scalp BID x 10 days.  Actinic Damage with PreCancerous Actinic Keratoses Counseling for Topical Chemotherapy Management: Patient exhibits: - Severe, confluent actinic changes with pre-cancerous actinic keratoses that is secondary to cumulative UV radiation exposure over time - Condition that is severe; chronic, not at goal. - diffuse scaly erythematous macules and papules with underlying dyspigmentation - Discussed Prescription "Field Treatment" topical Chemotherapy for Severe, Chronic Confluent Actinic Changes with Pre-Cancerous Actinic Keratoses Field treatment involves treatment of an entire area of skin that has confluent Actinic Changes (Sun/ Ultraviolet light damage) and PreCancerous Actinic Keratoses by  method of PhotoDynamic Therapy (PDT) and/or prescription Topical Chemotherapy agents such as 5-fluorouracil, 5-fluorouracil/calcipotriene, and/or imiquimod.  The purpose is to decrease the number of clinically evident and subclinical PreCancerous lesions to prevent progression to development of skin cancer by chemically destroying early precancer changes that may or may not be visible.  It has been shown to reduce the risk of developing skin cancer in the treated area. As a result of treatment, redness, scaling, crusting, and open sores may occur during treatment course. One or more than one of these methods may be used and may have to be used several times to control, suppress and eliminate the PreCancerous changes. Discussed treatment course, expected reaction, and possible side effects. - Recommend daily broad spectrum sunscreen SPF 30+ to sun-exposed areas, reapply every 2 hours as needed.  - Staying in the shade or wearing long sleeves, sun glasses (UVA+UVB protection) and wide brim hats (4-inch brim around the entire circumference of the hat) are also recommended. - Call for new or changing lesions.  Seborrheic Keratoses - Stuck-on, waxy, tan-brown papules and/or plaques  - Benign-appearing - Discussed benign etiology and prognosis. - Observe - Call for any changes  Lentigines - Scattered tan macules - Due to sun exposure - Benign-appearing, observe - Recommend daily broad spectrum sunscreen SPF 30+ to sun-exposed areas, reapply every 2 hours as needed. - Call for any changes  Return in about 8 months (around 10/24/2023) for TBSE.  Luther Redo, CMA, am acting as scribe for Sarina Ser, MD . Documentation: I have reviewed the above documentation for accuracy and completeness, and I agree with the above.  Sarina Ser, MD

## 2023-02-21 NOTE — Patient Instructions (Addendum)
5-Fluorouracil/Calcipotriene Patient Education   Actinic keratoses are the dry, red scaly spots on the skin caused by sun damage. A portion of these spots can turn into skin cancer with time, and treating them can help prevent development of skin cancer.   Treatment of these spots requires removal of the defective skin cells. There are various ways to remove actinic keratoses, including freezing with liquid nitrogen, treatment with creams, or treatment with a blue light procedure in the office.   5-fluorouracil cream is a topical cream used to treat actinic keratoses. It works by interfering with the growth of abnormal fast-growing skin cells, such as actinic keratoses. These cells peel off and are replaced by healthy ones. THIS CREAM SHOULD BE KEPT OUT OF REACH OF CHILDREN AND PETS AND SHOULD NOT BE USED BY PREGNANT WOMEN.  5-fluorouracil/calcipotriene is a combination of the 5-fluorouracil cream with a vitamin D analog cream called calcipotriene. The calcipotriene alone does not treat actinic keratoses. However, when it is combined with 5-fluorouracil, it helps the 5-fluorouracil treat the actinic keratoses much faster so that the same results can be achieved with a much shorter treatment time.  INSTRUCTIONS FOR 5-FLUOROURACIL/CALCIPOTRIENE CREAM:   5-fluorouracil/calcipotriene cream typically only needs to be used for 4-7 days. A thin layer should be applied twice a day to the treatment areas recommended by your physician.   If your physician prescribed you separate tubes of 5-fluourouracil and calcipotriene, apply a thin layer of 5-fluorouracil followed by a thin layer of calcipotriene.   Avoid contact with your eyes or nostrils. Avoid applying the cream to your eyelids or lips unless directed to apply there by your physician. Do not use 5-fluorouracil/calcipotriene cream on infected or open wounds.   You will develop redness, irritation and some crusting at areas where you have pre-cancer  damage/actinic keratoses. IF YOU DEVELOP PAIN, BLEEDING, OR SIGNIFICANT CRUSTING, STOP THE TREATMENT EARLY - you have already gotten a good response and the actinic keratoses should clear up well.  Wash your hands after applying 5-fluorouracil 5% cream on your skin.   A moisturizer or sunscreen with a minimum SPF 30 should be applied each morning.   Once you have finished the treatment, you can apply a thin layer of Vaseline twice a day to irritated areas to soothe and calm the areas more quickly. If you experience significant discomfort, contact your physician.  For some patients it is necessary to repeat the treatment for best results.  SIDE EFFECTS: When using 5-fluorouracil/calcipotriene cream, you may have mild irritation, such as redness, dryness, swelling, or a mild burning sensation. This usually resolves within 2 weeks. The more actinic keratoses you have, the more redness and inflammation you can expect during treatment. Eye irritation has been reported rarely. If this occurs, please let us know.   If you have any trouble using this cream, please send [place] a MyChart message or call the office. If you have any other questions about this information, please do not hesitate to ask me before you leave the office or contact me on MyChart or by phone.   Due to recent changes in healthcare laws, you may see results of your pathology and/or laboratory studies on MyChart before the doctors have had a chance to review them. We understand that in some cases there may be results that are confusing or concerning to you. Please understand that not all results are received at the same time and often the doctors may need to interpret multiple results in order to provide  you with the best plan of care or course of treatment. Therefore, we ask that you please give [place] 2 business days to thoroughly review all your results before contacting the office for clarification. Should we see a critical lab result, you  will be contacted sooner.   If You Need Anything After Your Visit  If you have any questions or concerns for your doctor, please call our main line at [phone] and press option 4 to reach your doctor's medical assistant. If no one answers, please leave a voicemail as directed and we will return your call as soon as possible. Messages left after 4 pm will be answered the following business day.   You may also send [place] a message via [place]. We typically respond to MyChart messages within 1-2 business days.  For prescription refills, please ask your pharmacy to contact our office. Our fax number is [phone].  If you have an urgent issue when the clinic is closed that cannot wait until the next business day, you can page your doctor at the number below.    Please note that while we do our best to be available for urgent issues outside of office hours, we are not available 24/7.   If you have an urgent issue and are unable to reach [place], you may choose to seek medical care at your doctor's office, retail clinic, urgent care center, or emergency room.  If you have a medical emergency, please immediately call 911 or go to the emergency department.  Pager Numbers  - Dr. Nehemiah Massed: [phone]  - Dr. Laurence Ferrari: [phone]  - Dr. Nicole Kindred: [phone]  In the event of inclement weather, please call our main line at [phone] for an update on the status of any delays or closures.  Dermatology Medication Tips: Please keep the boxes that topical medications come in in order to help keep track of the instructions about where and how to use these. Pharmacies typically print the medication instructions only on the boxes and not directly on the medication tubes.   If your medication is too expensive, please contact our office at [phone] option 4 or send [place] a message through [place].   We are unable to tell what your co-pay for medications will be in advance as this is different depending  on your insurance coverage. However, we may be able to find a substitute medication at lower cost or fill out paperwork to get insurance to cover a needed medication.   If a prior authorization is required to get your medication covered by your insurance company, please allow [place] 1-2 business days to complete this process.  Drug prices often vary depending on where the prescription is filled and some pharmacies may offer cheaper prices.  The website www.goodrx.com contains coupons for medications through different pharmacies. The prices here do not account for what the cost may be with help from insurance (it may be cheaper with your insurance), but the website can give you the price if you did not use any insurance.  - You can print the associated coupon and take it with your prescription to the pharmacy.  - You may also stop by our office during regular business hours and pick up a GoodRx coupon card.  - If you need your prescription sent electronically to a different pharmacy, notify our office through [facility] or by phone at [phone] option 4.     Si Usted Necesita Algo Despus de Su Visita  Tambin puede enviarnos un mensaje a  travs de MyChart. Por lo general respondemos a los mensajes de MyChart en el transcurso de 1 a 2 das hbiles.  Para renovar recetas, por favor pida a su farmacia que se ponga en contacto con nuestra oficina. Harland Dingwall de fax es [place] [phone].  Si tiene un asunto urgente cuando la clnica est cerrada y que no puede esperar hasta el siguiente da hbil, puede llamar/localizar a su doctor(a) al nmero que aparece a continuacin.   Por favor, tenga en cuenta que aunque hacemos todo lo posible para estar disponibles para asuntos urgentes fuera del horario de [place], no estamos disponibles las 24 horas del da, los 7 das de la [place].   Si tiene un problema urgente y no puede comunicarse con nosotros, puede optar por buscar atencin mdica  en  el consultorio de su doctor(a), en una clnica privada, en un centro de atencin urgente o en una sala de emergencias.  Si tiene Engineering geologist, por favor llame inmediatamente al 911 o vaya a la sala de emergencias.  Nmeros de bper  - Dr. Nehemiah Massed: [phone]  - Dra. Moye: [phone]  - Dra. Nicole Kindred: [phone]  En caso de inclemencias del [place], por favor llame a Johnsie Kindred principal al [phone] para una actualizacin sobre el [place] de cualquier retraso o cierre.  Consejos para la medicacin en dermatologa: Por favor, guarde las cajas en las que vienen los medicamentos de uso tpico para ayudarle a seguir las instrucciones sobre dnde y cmo usarlos. Las farmacias generalmente imprimen las instrucciones del medicamento slo en las cajas y no directamente en los tubos del [place].   Si su medicamento es muy caro, por favor, pngase en contacto con Zigmund Daniel llamando al [phone] y presione la opcin 4 o envenos un mensaje a travs de Pharmacist, community.   No podemos decirle cul ser su copago por los medicamentos por adelantado ya que esto es diferente dependiendo de la cobertura de su seguro. Sin embargo, es posible que podamos encontrar un medicamento sustituto a Electrical engineer un formulario para que el seguro cubra el medicamento que se considera necesario.   Si se requiere una autorizacin previa para que su compaa de seguros [place] su medicamento, por favor permtanos de 1 a 2 das hbiles para completar este proceso.  Los precios de los medicamentos varan con frecuencia dependiendo del Environmental consultant de dnde se surte la receta y alguna farmacias pueden ofrecer precios ms baratos.  El sitio web www.goodrx.com tiene cupones para medicamentos de Airline pilot. Los precios aqu no tienen en cuenta lo que podra costar con la ayuda del seguro (puede ser ms barato con su seguro), pero el sitio web puede darle el precio si no utiliz Research scientist (physical sciences).  -  Puede imprimir el cupn correspondiente y llevarlo con su receta a la farmacia.  - Tambin puede pasar por nuestra oficina durante el horario de atencin regular y Charity fundraiser una tarjeta de cupones de GoodRx.  - Si necesita que su receta se enve electrnicamente a una farmacia diferente, informe a nuestra oficina a travs de MyChart de [place] o por telfono llamando al [phone] y presione la opcin 4.

## 2023-02-24 ENCOUNTER — Encounter: Payer: Self-pay | Admitting: Dermatology

## 2023-03-17 ENCOUNTER — Other Ambulatory Visit: Payer: Self-pay | Admitting: Nurse Practitioner

## 2023-03-18 NOTE — Telephone Encounter (Signed)
Requested Prescriptions  Pending Prescriptions Disp Refills   atorvastatin (LIPITOR) 20 MG tablet [Pharmacy Med Name: ATORVASTATIN 20MG  TABLETS] 90 tablet 4    Sig: TAKE 1 TABLET(20 MG) BY MOUTH DAILY     Cardiovascular:  Antilipid - Statins Failed - 03/17/2023  6:30 AM      Failed - Lipid Panel in normal range within the last 12 months    Cholesterol, Total  Date Value Ref Range Status  02/08/2023 131 100 - 199 mg/dL Final   Cholesterol Piccolo, Waived  Date Value Ref Range Status  02/15/2020 120 <200 mg/dL Final    Comment:                            Desirable                <200                         Borderline High      200- 239                         High                     >239    LDL Chol Calc (NIH)  Date Value Ref Range Status  02/08/2023 65 0 - 99 mg/dL Final   HDL  Date Value Ref Range Status  02/08/2023 33 (L) >39 mg/dL Final   Triglycerides  Date Value Ref Range Status  02/08/2023 197 (H) 0 - 149 mg/dL Final   Triglycerides Piccolo,Waived  Date Value Ref Range Status  02/15/2020 156 (H) <150 mg/dL Final    Comment:                            Normal                   <150                         Borderline High     150 - 199                         High                200 - 499                         Very High                >499          Passed - Patient is not pregnant      Passed - Valid encounter within last 12 months    Recent Outpatient Visits           1 month ago Type 2 diabetes mellitus with obesity ([place])   [place], [place] T, NP   7 months ago Type 2 diabetes mellitus with obesity ([place])   [place], [place] T, NP   1 year ago Type 2 diabetes mellitus with obesity ([place])   [place], Henrine Screws T, NP   1 year ago Type 2 diabetes mellitus with obesity ([place])   [place]  [name], NP   1 year ago Lab  test positive for detection of COVID-19 virus   [place], Barbaraann Faster, NP       Future Appointments             In 4 months Cannady, Barbaraann Faster, NP [place], [place]   In 7 months Ralene Bathe, MD [place]

## 2023-03-21 DIAGNOSIS — M5414 Radiculopathy, thoracic region: Secondary | ICD-10-CM | POA: Diagnosis not present

## 2023-03-21 DIAGNOSIS — M546 Pain in thoracic spine: Secondary | ICD-10-CM | POA: Diagnosis not present

## 2023-04-01 ENCOUNTER — Other Ambulatory Visit: Payer: Self-pay | Admitting: Neurology

## 2023-04-01 DIAGNOSIS — G20A1 Parkinson's disease without dyskinesia, without mention of fluctuations: Secondary | ICD-10-CM
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
## 2023-04-03 ENCOUNTER — Telehealth: Payer: Self-pay

## 2023-04-03 ENCOUNTER — Ambulatory Visit: Payer: Medicare Other | Admitting: Neurology

## 2023-04-03 ENCOUNTER — Encounter: Payer: Self-pay | Admitting: Neurology

## 2023-04-03 VITALS — BP 124/78 | HR 73 | Ht 71.0 in | Wt 250.4 lb

## 2023-04-03 DIAGNOSIS — G20A1 Parkinson's disease without dyskinesia, without mention of fluctuations: Secondary | ICD-10-CM | POA: Diagnosis not present

## 2023-04-03 NOTE — Telephone Encounter (Signed)
Medication Samples have been provided to the patient.  Drug name: rytary       Strength: 145 mg        Qty: 100 caps  LOT: [id number]  Exp.Date: 10/24  Dosing instructions: SAMPLES  The patient has been instructed regarding the correct time, dose, and frequency of taking this medication, including desired effects and most common side effects.   Lenise Herald 11:27 AM 04/03/2023

## 2023-04-03 NOTE — Telephone Encounter (Signed)
Medication Samples have been provided to the patient.  Drug name: rytary        Strength: 195        Qty: 100  LOT: [id number]  Exp.Date: 10/24  Dosing instructions:  Take 1 tablet by mouth 3 (three) times daily.   The patient has been instructed regarding the correct time, dose, and frequency of taking this medication, including desired effects and most common side effects.   Brian Frederick

## 2023-04-03 NOTE — Patient Instructions (Addendum)
STOP carbidopa/levodopa CR START rytary 145 mg three times per day at 7am/11am/4pm x 1 month and then take rytary 195 mg three times per day thereafter.  Call us when you are 1/2 way through this bottle so we can do a prior authorization   Dr. Rodolph Bong - [facility]  Address: [address], [place], [place] [address] Hours:  Phone: [phone]  Dr. On[facility]  Address: [address] [place], [place] [address] Phone: [phone]

## 2023-04-04 ENCOUNTER — Telehealth: Payer: Self-pay | Admitting: Nurse Practitioner

## 2023-04-04 NOTE — Telephone Encounter (Signed)
I don't think this is [facility].

## 2023-04-04 NOTE — Telephone Encounter (Signed)
Pt called to request that his PCP submit orders to [facility] for his trulicity at the increased strength that he and his PCP just discussed. Seeking his refill, he is on an automatic refill. They need the order from the provider first.

## 2023-04-11 ENCOUNTER — Other Ambulatory Visit: Payer: Self-pay | Admitting: Nurse Practitioner

## 2023-04-11 MED ORDER — TRULICITY 3 MG/0.5ML ~~LOC~~ SOAJ: 3.0000 mg | SUBCUTANEOUS | 0 refills | Status: DC

## 2023-04-11 NOTE — Telephone Encounter (Signed)
Requested Prescriptions  Pending Prescriptions Disp Refills   Dulaglutide (TRULICITY) 3 MG/0.5ML SOPN 6 mL 0    Sig: Inject 3 mg as directed once a week.     Endocrinology:  Diabetes - GLP-1 Receptor Agonists Passed - 04/11/2023 10:04 AM      Passed - HBA1C is between 0 and 7.9 and within 180 days    HB A1C (BAYER DCA - WAIVED)  Date Value Ref Range Status  02/08/2023 6.3 (H) 4.8 - 5.6 % Final    Comment:             Prediabetes: 5.7 - 6.4          Diabetes: >6.4          Glycemic control for adults with diabetes: <7.0          Passed - Valid encounter within last 6 months    Recent Outpatient Visits           2 months ago Type 2 diabetes mellitus with obesity (HCC)   [place] [facility] [place], [place] T, NP   8 months ago Type 2 diabetes mellitus with obesity (HCC)   [place] [facility] [place], Corrie Dandy T, NP   1 year ago Type 2 diabetes mellitus with obesity (HCC)   [place] [facility] [place], Corrie Dandy T, NP   1 year ago Type 2 diabetes mellitus with obesity (HCC)   [facility] [place], Corrie Dandy T, NP   1 year ago Lab test positive for detection of COVID-19 virus   [place] [facility] [place], Dorie Rank, NP       Future Appointments             In 4 months Cannady, Dorie Rank, NP [facility], [facility]   In 6 months Deirdre Evener, MD [facility] [place] [facility]

## 2023-04-11 NOTE — Telephone Encounter (Signed)
Pt pharmacy requests that Rx be sent to them because pt is a part of a program where he can get the medication free.  Medication Refill - Medication: Dulaglutide (TRULICITY) 3 MG/0.5ML SOPN   Has the patient contacted their pharmacy? Yes.    Preferred Pharmacy (with phone number or street name):  [facility] - [place], [place] - [address] Phone: [phone]  Fax: [phone]     Has the patient been seen for an appointment in the last year OR does the patient have an upcoming appointment? Yes.    Agent: Please be advised that RX refills may take up to 3 business days. We ask that you follow-up with your pharmacy.

## 2023-04-16 ENCOUNTER — Other Ambulatory Visit: Payer: Self-pay | Admitting: Nurse Practitioner

## 2023-04-17 ENCOUNTER — Other Ambulatory Visit: Payer: Self-pay | Admitting: Nurse Practitioner

## 2023-04-17 NOTE — Telephone Encounter (Signed)
Requested Prescriptions  Pending Prescriptions Disp Refills   lisinopril (ZESTRIL) 5 MG tablet [Pharmacy Med Name: LISINOPRIL 5MG  TABLETS] 90 tablet 1    Sig: TAKE 1 TABLET(5 MG) BY MOUTH DAILY     Cardiovascular:  ACE Inhibitors Passed - 04/16/2023  2:40 PM      Passed - Cr in normal range and within 180 days    Creatinine  Date Value Ref Range Status  09/02/2013 1.52 (H) 0.60 - 1.30 mg/dL Final   Creatinine, Ser  Date Value Ref Range Status  02/08/2023 1.16 0.76 - 1.27 mg/dL Final         Passed - K in normal range and within 180 days    Potassium  Date Value Ref Range Status  02/08/2023 4.2 3.5 - 5.2 mmol/L Final  09/02/2013 4.2 3.5 - 5.1 mmol/L Final         Passed - Patient is not pregnant      Passed - Last BP in normal range    BP Readings from Last 1 Encounters:  04/03/23 124/78         Passed - Valid encounter within last 6 months    Recent Outpatient Visits           2 months ago Type 2 diabetes mellitus with obesity (HCC)   [place] [facility] [place], [place] T, NP   8 months ago Type 2 diabetes mellitus with obesity (HCC)   [place] [facility] [place], Corrie Dandy T, NP   1 year ago Type 2 diabetes mellitus with obesity (HCC)   [place] [facility] [place], Corrie Dandy T, NP   1 year ago Type 2 diabetes mellitus with obesity (HCC)   [place] [facility] [place], Corrie Dandy T, NP   1 year ago Lab test positive for detection of COVID-19 virus   [place], Dorie Rank, NP       Future Appointments             In 3 months Cannady, Dorie Rank, NP [place] [facility], [facility]   In 6 months Deirdre Evener, MD [facility] [place] [facility]

## 2023-04-17 NOTE — Telephone Encounter (Signed)
Requested Prescriptions  Pending Prescriptions Disp Refills   metoprolol succinate (TOPROL-XL) 100 MG 24 hr tablet [Pharmacy Med Name: METOPROLOL ER SUCCINATE 100MG  TABS] 90 tablet 0    Sig: TAKE 1 TABLET BY MOUTH EVERY DAY WITH FOOD OR IMMEDIATELY AFTER A MEAL     Cardiovascular:  Beta Blockers Passed - 04/17/2023  6:29 AM      Passed - Last BP in normal range    BP Readings from Last 1 Encounters:  04/03/23 124/78         Passed - Last Heart Rate in normal range    Pulse Readings from Last 1 Encounters:  04/03/23 73         Passed - Valid encounter within last 6 months    Recent Outpatient Visits           2 months ago Type 2 diabetes mellitus with obesity (HCC)   [place] [facility] [place], [place] T, NP   8 months ago Type 2 diabetes mellitus with obesity (HCC)   [place] [facility] [place], Corrie Dandy T, NP   1 year ago Type 2 diabetes mellitus with obesity (HCC)   [place] [facility] [place], Corrie Dandy T, NP   1 year ago Type 2 diabetes mellitus with obesity (HCC)   [place] [facility] [place], Corrie Dandy T, NP   1 year ago Lab test positive for detection of COVID-19 virus   [place] [facility] [place], Dorie Rank, NP       Future Appointments             In 3 months Cannady, Dorie Rank, NP [place] [facility], [facility]   In 6 months Deirdre Evener, MD [place]             escitalopram (LEXAPRO) 10 MG tablet [Pharmacy Med Name: ESCITALOPRAM 10MG  TABLETS] 90 tablet 0    Sig: TAKE 1 TABLET(10 MG) BY MOUTH DAILY     Psychiatry:  Antidepressants - SSRI Passed - 04/17/2023  6:29 AM      Passed - Valid encounter within last 6 months    Recent Outpatient Visits           2 months ago Type 2 diabetes mellitus with obesity (HCC)   [place] [facility] [place], Luana T, NP   8 months ago Type 2 diabetes mellitus with obesity (HCC)   [place]  [facility] [place], Corrie Dandy T, NP   1 year ago Type 2 diabetes mellitus with obesity (HCC)   [place] [facility] [place], Corrie Dandy T, NP   1 year ago Type 2 diabetes mellitus with obesity (HCC)   [place] [facility] [place], Corrie Dandy T, NP   1 year ago Lab test positive for detection of COVID-19 virus   [place] [facility] [place], Dorie Rank, NP       Future Appointments             In 3 months Cannady, Dorie Rank, NP [place] [facility] [place], [facility]   In 6 months Deirdre Evener, MD [facility] [place] [facility]

## 2023-05-27 ENCOUNTER — Telehealth: Payer: Self-pay | Admitting: Neurology

## 2023-05-27 ENCOUNTER — Other Ambulatory Visit: Payer: Self-pay

## 2023-05-27 MED ORDER — RYTARY 48.75-195 MG PO CPCR: 1.0000 | ORAL_CAPSULE | Freq: Three times a day (TID) | ORAL | 1 refills | Status: DC

## 2023-05-27 NOTE — Telephone Encounter (Signed)
Called pateint and he needs RX sent in and a PA done for Rytary

## 2023-05-27 NOTE — Telephone Encounter (Signed)
Left a VM stating that the paper work for the rytary and the carbidopa levodopa

## 2023-05-29 ENCOUNTER — Telehealth: Payer: Self-pay | Admitting: Pharmacy Technician

## 2023-05-29 ENCOUNTER — Other Ambulatory Visit (HOSPITAL_COMMUNITY): Payer: Self-pay

## 2023-05-29 NOTE — Telephone Encounter (Signed)
Pharmacy Patient Advocate Encounter  Prior Authorization for Rytary 48.75-195MG  er capsules has been APPROVED by [facility] from 05/28/2023 to 12/17/2023.   PA # [id number]

## 2023-05-29 NOTE — Telephone Encounter (Signed)
Patient Advocate Encounter  Received notification from [employer] that prior authorization for RYTARY 48.75MG  is required.   PA submitted on 6.12.24 Key [id number] Status is pending

## 2023-06-22 ENCOUNTER — Other Ambulatory Visit: Payer: Self-pay | Admitting: Nurse Practitioner

## 2023-06-24 NOTE — Telephone Encounter (Signed)
Requested Prescriptions  Pending Prescriptions Disp Refills   TRULICITY 3 MG/0.5ML SOPN [Pharmacy Med Name: Trulicity Subcutaneous Solution Pen-injector 3 MG/0.5ML] 6 mL 0    Sig: INJECT 3 MG (0.5 ML) UNDER THE SKIN ONCE A WEEK AS DIRECTED     Endocrinology:  Diabetes - GLP-1 Receptor Agonists Passed - 06/22/2023  8:01 AM      Passed - HBA1C is between 0 and 7.9 and within 180 days    HB A1C (BAYER DCA - WAIVED)  Date Value Ref Range Status  02/08/2023 6.3 (H) 4.8 - 5.6 % Final    Comment:             Prediabetes: 5.7 - 6.4          Diabetes: >6.4          Glycemic control for adults with diabetes: <7.0          Passed - Valid encounter within last 6 months    Recent Outpatient Visits           4 months ago Type 2 diabetes mellitus with obesity (HCC)   [place] [facility] [place], [place] T, NP   10 months ago Type 2 diabetes mellitus with obesity (HCC)   [place] [facility] [place], [place] T, NP   1 year ago Type 2 diabetes mellitus with obesity (HCC)   [place] [facility] [place], Corrie Dandy T, NP   1 year ago Type 2 diabetes mellitus with obesity (HCC)   [place] [facility] [place], Corrie Dandy T, NP   1 year ago Lab test positive for detection of COVID-19 virus   [place] [facility] [place], Dorie Rank, NP       Future Appointments             In 1 month [place], Dorie Rank, NP  [facility], [facility]   In 4 months Deirdre Evener, MD [facility] [place] [facility]

## 2023-07-16 ENCOUNTER — Other Ambulatory Visit: Payer: Self-pay | Admitting: Nurse Practitioner

## 2023-07-16 NOTE — Telephone Encounter (Signed)
Requested Prescriptions  Pending Prescriptions Disp Refills   escitalopram (LEXAPRO) 10 MG tablet [Pharmacy Med Name: ESCITALOPRAM 10MG  TABLETS] 90 tablet 0    Sig: TAKE 1 TABLET(10 MG) BY MOUTH DAILY     Psychiatry:  Antidepressants - SSRI Passed - 07/16/2023  7:02 AM      Passed - Valid encounter within last 6 months    Recent Outpatient Visits           5 months ago Type 2 diabetes mellitus with obesity (HCC)   [place] [facility] [place], [place] T, NP   11 months ago Type 2 diabetes mellitus with obesity (HCC)   [place] [facility] [place], Corrie Dandy T, NP   1 year ago Type 2 diabetes mellitus with obesity (HCC)   [place] [facility] [place], Corrie Dandy T, NP   1 year ago Type 2 diabetes mellitus with obesity (HCC)   [place] [facility] [place], Corrie Dandy T, NP   1 year ago Lab test positive for detection of COVID-19 virus   [place] [facility] [place], Dorie Rank, NP       Future Appointments             In 3 weeks Marjie Skiff, NP  [facility], [facility]   In 3 months Deirdre Evener, MD [facility] [place] [facility]

## 2023-08-07 DIAGNOSIS — M25512 Pain in left shoulder: Secondary | ICD-10-CM | POA: Diagnosis not present

## 2023-08-08 ENCOUNTER — Other Ambulatory Visit: Payer: Self-pay | Admitting: Orthopaedic Surgery

## 2023-08-08 DIAGNOSIS — M25512 Pain in left shoulder: Secondary | ICD-10-CM

## 2023-08-10 NOTE — Patient Instructions (Signed)
Be Involved in Caring For Your Health:  Taking Medications When medications are taken as directed, they can greatly improve your health. But if they are not taken as prescribed, they may not work. In some cases, not taking them correctly can be harmful. To help ensure your treatment remains effective and safe, understand your medications and how to take them. Bring your medications to each visit for review by your provider.  Your lab results, notes, and after visit summary will be available on My Chart. We strongly encourage you to use this feature. If lab results are abnormal the clinic will contact you with the appropriate steps. If the clinic does not contact you assume the results are satisfactory. You can always view your results on My Chart. If you have questions regarding your health or results, please contact the clinic during office hours. You can also ask questions on My Chart.  We at [facility] are grateful that you chose [place] to provide your care. We strive to provide evidence-based and compassionate care and are always looking for feedback. If you get a survey from the clinic please complete this so we can hear your opinions.  Diabetes Mellitus and Standards of Medical Care Living with and managing diabetes (diabetes mellitus) can be complicated. Your diabetes treatment may be managed by a team of health care providers, including: A physician who specializes in diabetes (endocrinologist). You might also have visits with a nurse practitioner or physician assistant. Nurses. A registered dietitian. A certified diabetes care and education specialist. An exercise specialist. A pharmacist. An eye doctor. A foot specialist (podiatrist). A dental care provider. A primary care provider. A mental health care provider. How to manage your diabetes You can do many things to successfully manage your diabetes. Your health care providers will follow guidelines to help you get the best  quality of care. Here are general guidelines for your diabetes management plan. Your health care providers may give you more specific instructions. Physical exams When you are diagnosed with diabetes, and each year after that, your health care provider will ask about your medical and family history. You will have a physical exam, which may include: Measuring your height, weight, and body mass index (BMI). Checking your blood pressure. This will be done at every routine medical visit. Your target blood pressure may vary depending on your medical conditions, your age, and other factors. A thyroid exam. A skin exam. Screening for nerve damage (peripheral neuropathy). This may include checking the pulse in your legs and feet and the level of sensation in your hands and feet. A foot exam to inspect the structure and skin of your feet, including checking for cuts, bruises, redness, blisters, sores, or other problems. Screening for blood vessel (vascular) problems. This may include checking the pulse in your legs and feet and checking your temperature. Blood tests Depending on your treatment plan and your personal needs, you may have the following tests: Hemoglobin A1C (HbA1C). This test provides information about blood sugar (glucose) control over the previous 2-3 months. It is used to adjust your treatment plan, if needed. This test will be done: At least 2 times a year, if you are meeting your treatment goals. 4 times a year, if you are not meeting your treatment goals or if your goals have changed. Lipid testing, including total cholesterol, LDL and HDL cholesterol, and triglyceride levels. The goal for LDL is less than 100 mg/dL (5.5 mmol/L). If you are at high risk for complications, the  goal is less than 70 mg/dL (3.9 mmol/L). The goal for HDL is 40 mg/dL (2.2 mmol/L) or higher for men, and 50 mg/dL (2.8 mmol/L) or higher for women. An HDL cholesterol of 60 mg/dL (3.3 mmol/L) or higher gives some  protection against heart disease. The goal for triglycerides is less than 150 mg/dL (8.3 mmol/L). Liver function tests. Kidney function tests. Thyroid function tests.  Dental and eye exams  Visit your dentist two times a year. If you have type 1 diabetes, your health care provider may recommend an eye exam within 5 years after you are diagnosed, and then once a year after your first exam. For children with type 1 diabetes, the health care provider may recommend an eye exam when your child is age 15 or older and has had diabetes for 3-5 years. After the first exam, your child should get an eye exam once a year. If you have type 2 diabetes, your health care provider may recommend an eye exam as soon as you are diagnosed, and then every 1-2 years after your first exam. Immunizations A yearly flu (influenza) vaccine is recommended annually for everyone 6 months or older. This is especially important if you have diabetes. The pneumonia (pneumococcal) vaccine is recommended for everyone 2 years or older who has diabetes. If you are age 68 or older, you may get the pneumonia vaccine as a series of two separate shots. The hepatitis B vaccine is recommended for adults shortly after being diagnosed with diabetes. Adults and children with diabetes should receive all other vaccines according to age-specific recommendations from the Centers for Disease Control and Prevention (CDC). Mental and emotional health Screening for symptoms of eating disorders, anxiety, and depression is recommended at the time of diagnosis and after as needed. If your screening shows that you have symptoms, you may need more evaluation. You may work with a mental health care provider. Follow these instructions at home: Treatment plan You will monitor your blood glucose levels and may give yourself insulin. Your treatment plan will be reviewed at every medical visit. You and your health care provider will discuss: How you are taking  your medicines, including insulin. Any side effects you have. Your blood glucose level target goals. How often you monitor your blood glucose level. Lifestyle habits, such as activity level and tobacco, alcohol, and substance use. Education Your health care provider will assess how well you are monitoring your blood glucose levels and whether you are taking your insulin and medicines correctly. He or she may refer you to: A certified diabetes care and education specialist to manage your diabetes throughout your life, starting at diagnosis. A registered dietitian who can create and review your personal nutrition plan. An exercise specialist who can discuss your activity level and exercise plan. General instructions Take over-the-counter and prescription medicines only as told by your health care provider. Keep all follow-up visits. This is important. Where to find support There are many diabetes support networks, including: American Diabetes Association (ADA): diabetes.org Defeat Diabetes Foundation: defeatdiabetes.org Where to find more information American Diabetes Association (ADA): www.diabetes.org Association of Diabetes Care & Education Specialists (ADCES): diabeteseducator.org International Diabetes Federation (IDF): http://hill.biz/ Summary Managing diabetes (diabetes mellitus) can be complicated. Your diabetes treatment may be managed by a team of health care providers. Your health care providers follow guidelines to help you get the best quality care. You should have physical exams, blood tests, blood pressure monitoring, immunizations, and screening tests regularly. Stay updated on how to manage your  diabetes. Your health care providers may also give you more specific instructions based on your individual health. This information is not intended to replace advice given to you by your health care provider. Make sure you discuss any questions you have with your health care  provider. Document Revised: 06/09/2020 Document Reviewed: 06/09/2020 Elsevier Patient Education  2024 [employer].

## 2023-08-12 ENCOUNTER — Ambulatory Visit (INDEPENDENT_AMBULATORY_CARE_PROVIDER_SITE_OTHER): Payer: Medicare Other | Admitting: Nurse Practitioner

## 2023-08-12 ENCOUNTER — Encounter: Payer: Self-pay | Admitting: Nurse Practitioner

## 2023-08-12 VITALS — BP 126/78 | HR 76 | Ht 70.5 in | Wt 242.2 lb

## 2023-08-12 DIAGNOSIS — K219 Gastro-esophageal reflux disease without esophagitis: Secondary | ICD-10-CM | POA: Diagnosis not present

## 2023-08-12 DIAGNOSIS — E1159 Type 2 diabetes mellitus with other circulatory complications: Secondary | ICD-10-CM | POA: Diagnosis not present

## 2023-08-12 DIAGNOSIS — E538 Deficiency of other specified B group vitamins: Secondary | ICD-10-CM

## 2023-08-12 DIAGNOSIS — G20A1 Parkinson's disease without dyskinesia, without mention of fluctuations: Secondary | ICD-10-CM

## 2023-08-12 DIAGNOSIS — E6609 Other obesity due to excess calories: Secondary | ICD-10-CM

## 2023-08-12 DIAGNOSIS — F419 Anxiety disorder, unspecified: Secondary | ICD-10-CM | POA: Diagnosis not present

## 2023-08-12 DIAGNOSIS — Z0000 Encounter for general adult medical examination without abnormal findings: Secondary | ICD-10-CM | POA: Diagnosis not present

## 2023-08-12 DIAGNOSIS — E119 Type 2 diabetes mellitus without complications: Secondary | ICD-10-CM

## 2023-08-12 DIAGNOSIS — E669 Obesity, unspecified: Secondary | ICD-10-CM

## 2023-08-12 DIAGNOSIS — E1169 Type 2 diabetes mellitus with other specified complication: Secondary | ICD-10-CM | POA: Diagnosis not present

## 2023-08-12 DIAGNOSIS — Z23 Encounter for immunization: Secondary | ICD-10-CM | POA: Diagnosis not present

## 2023-08-12 DIAGNOSIS — N400 Enlarged prostate without lower urinary tract symptoms: Secondary | ICD-10-CM

## 2023-08-12 DIAGNOSIS — E66811 Obesity, class 1: Secondary | ICD-10-CM

## 2023-08-12 DIAGNOSIS — E785 Hyperlipidemia, unspecified: Secondary | ICD-10-CM | POA: Diagnosis not present

## 2023-08-12 LAB — BAYER DCA HB A1C WAIVED: HB A1C (BAYER DCA - WAIVED): 5.8 % — ABNORMAL HIGH (ref 4.8–5.6)

## 2023-08-12 MED ORDER — OMEGA-3-ACID ETHYL ESTERS 1 G PO CAPS: 1.0000 g | ORAL_CAPSULE | Freq: Two times a day (BID) | ORAL | 12 refills | Status: DC

## 2023-08-12 MED ORDER — LANSOPRAZOLE 30 MG PO CPDR: DELAYED_RELEASE_CAPSULE | ORAL | 4 refills | Status: DC

## 2023-08-12 MED ORDER — METFORMIN HCL ER 500 MG PO TB24: ORAL_TABLET | ORAL | 4 refills | Status: DC

## 2023-08-12 MED ORDER — METOPROLOL SUCCINATE ER 100 MG PO TB24: ORAL_TABLET | ORAL | 4 refills | Status: DC

## 2023-08-12 MED ORDER — ATORVASTATIN CALCIUM 20 MG PO TABS: 20.0000 mg | ORAL_TABLET | Freq: Every day | ORAL | 4 refills | Status: DC

## 2023-08-12 MED ORDER — MELOXICAM 15 MG PO TABS: 15.0000 mg | ORAL_TABLET | Freq: Every day | ORAL | 4 refills | Status: AC | PRN

## 2023-08-12 MED ORDER — ESCITALOPRAM OXALATE 10 MG PO TABS: 10.0000 mg | ORAL_TABLET | Freq: Every day | ORAL | 4 refills | Status: DC

## 2023-08-12 MED ORDER — METFORMIN HCL ER 500 MG PO TB24: 500.0000 mg | ORAL_TABLET | Freq: Every day | ORAL | 4 refills | Status: DC

## 2023-08-12 MED ORDER — LISINOPRIL 2.5 MG PO TABS: 2.5000 mg | ORAL_TABLET | Freq: Every day | ORAL | 4 refills | Status: DC

## 2023-08-12 MED ORDER — GABAPENTIN 100 MG PO CAPS: ORAL_CAPSULE | ORAL | 4 refills | Status: DC

## 2023-08-12 NOTE — Assessment & Plan Note (Signed)
Chronic, stable on Lexapro.  Continue low dose medication and monitor.  Denies SI/HI.

## 2023-08-12 NOTE — Assessment & Plan Note (Addendum)
Chronic ongoing, A1c in February was 6.3%, will recheck today, suspect it will still be stable. Will continue Trulicity 3 MG and continue Metformin XR at 500 MG at night for ongoing diabetes control and weight loss support.  Check BS 2-3 times a day and document + continue heavy focus on diet.  Return in 6 months. - Foot and eye exam up to date - Pneumococcal vaccines up to date. - On ACE and taking a statin - will reduce Benazepril to 2.5 MG due to orthostatic changes.

## 2023-08-12 NOTE — Progress Notes (Signed)
BP 126/78 (BP Location: Left Arm, Patient Position: Sitting)   Pulse 76   Ht 5' 10.5" (1.791 m)   Wt 242 lb 3.2 oz (109.9 kg)   SpO2 97%   BMI 34.26 kg/m    Subjective:    Patient ID: Brian Frederick, male    DOB: 04-07-1956, 67 y.o.   MRN: [id number]  HPI: Brian Frederick is a 67 y.o. male presenting on 03-Sep-2023 for comprehensive medical examination. Current medical complaints include:none  He currently lives with: wife Interim Problems from his last visit: no   DIABETES Taking Metformin 500 MG BID and Trulicity 3 MG (gets with assistance).  A1c February 6.3%. Takes Gabapentin 200 MG BID for neuropathy. B12 taken occasionally.  Has lost 8 lbs since last visit. Hypoglycemic episodes:no Polydipsia/polyuria: no Visual disturbance: no Chest pain: no Paresthesias: no Glucose Monitoring: yes             Accucheck frequency: Daily             Fasting glucose: 108 to 120 range             Post prandial:             Evening:             Before meals: Taking Insulin?: none             Long acting insulin: was in past             Short acting insulin: Blood Pressure Monitoring: weekly Retinal Examination: Up to Date -- [facility] Foot Exam: Up to Date Pneumovax: Up to Date Influenza: Up to Date Aspirin: yes    HYPERTENSION / HYPERLIPIDEMIA Continues Lisinopril 5 MG daily, Metoprolol 100 MG daily, Lipitor 20 MG, Lovaza + ASA.  Has not been taking Atorvastatin due to concern for dizziness when standing up or bending -- discussed at length with him. Satisfied with current treatment? yes Duration of hypertension: chronic BP monitoring frequency: not checking BP range:  BP medication side effects: no Duration of hyperlipidemia: chronic Cholesterol medication side effects: no Cholesterol supplements: none Medication compliance: good compliance Aspirin: yes Recent stressors: no Recurrent headaches: no Visual changes: no Palpitations: no Dyspnea: no Chest pain: no Lower  extremity edema: no Dizzy/lightheaded: no   BMI Metric Follow Up - 09-03-23 0824       BMI Metric Follow Up-Please document annually   BMI Metric Follow Up Nutrition counseling               PARKINSON"S DISEASE: Diagnosed in 2015.  Sees Dr. Arbutus Leas. Denies any recent falls or decline in function.  Saw Dr. Arbutus Leas with neurology last on 04/03/23, they started Rytary and he continues Artane. Reports continues to have tremor to left hand.  Goes to dermatology regularly, last 02/21/23.   ANXIETY/STRESS Continues Lexapro 10 MG daily. Duration:controlled Anxious mood: no  Excessive worrying: no Irritability: no  Sweating: no Nausea: no Palpitations:no Hyperventilation: no Panic attacks: no Agoraphobia: no  Obscessions/compulsions: no Depressed mood: no Anhedonia: no Weight changes: no Insomnia: no Hypersomnia: no Fatigue/loss of energy: no Feelings of worthlessness: no Feelings of guilt: no Impaired concentration/indecisiveness: no Suicidal ideations: no  Crying spells: no Recent Stressors/Life Changes: no   Relationship problems: no   Family stress: no     Financial stress: no    Job stress: no    Recent death/loss: no    2023-09-03    8:14 AM 01/22/2023    3:33 PM 08/08/2022  9:32 AM 02/07/2022   10:16 AM 12/13/2021   11:28 AM  Depression screen PHQ 2/9  Decreased Interest 1 0 0 0 0  Down, Depressed, Hopeless 0 0 0 0 0  PHQ - 2 Score 1 0 0 0 0  Altered sleeping 0 0 0 0 0  Tired, decreased energy 1 0 1 1 0  Change in appetite 0 0 0 0 0  Feeling bad or failure about yourself  0 0 0 0 0  Trouble concentrating 0 0 0 0 0  Moving slowly or fidgety/restless 0 0 0 1   Suicidal thoughts 0 0 0 0   PHQ-9 Score 2 0 1 2 0  Difficult doing work/chores Not difficult at all Not difficult at all Not difficult at all Not difficult at all       08/12/2023    8:15 AM 02/08/2023    9:04 AM 08/08/2022    9:33 AM 02/07/2022   10:16 AM  GAD 7 : Generalized Anxiety Score  Nervous,  Anxious, on Edge 0 0 0 0  Control/stop worrying 0 0 0 0  Worry too much - different things 0 0 0 0  Trouble relaxing 0 0 0 0  Restless 0 0 0 1  Easily annoyed or irritable 0 0 0 0  Afraid - awful might happen 0 0 0 0  Total GAD 7 Score 0 0 0 1  Anxiety Difficulty Not difficult at all  Not difficult at all Not difficult at all   GERD Continues Prevacid 30 MG daily.  GERD control status: stable  Satisfied with current treatment? yes Heartburn frequency:  Medication side effects: no  Medication compliance: stable Dysphagia: no Odynophagia:  no Hematemesis: no Blood in stool: no EGD: no   Functional Status Survey: Is the patient deaf or have difficulty hearing?: No Does the patient have difficulty seeing, even when wearing glasses/contacts?: No Does the patient have difficulty concentrating, remembering, or making decisions?: No Does the patient have difficulty walking or climbing stairs?: No Does the patient have difficulty dressing or bathing?: No Does the patient have difficulty doing errands alone such as visiting a doctor's office or shopping?: No  FALL RISK:    08/12/2023    8:15 AM 04/03/2023   11:02 AM 01/22/2023    3:35 PM 10/16/2022   10:55 AM 04/12/2022   10:55 AM  Fall Risk   Falls in the past year? 0 1 1 1  0  Number falls in past yr: 0 0 0 0 0  Injury with Fall? 0 0 0 0 0  Risk for fall due to : No Fall Risks  History of fall(s)    Follow up  Falls evaluation completed Falls prevention discussed;Falls evaluation completed Falls evaluation completed    Advanced Directives <no information>  Past Medical History:  Past Medical History:  Diagnosis Date   Achilles tendon disorder    Anxiety    Basal cell carcinoma 06/23/2008   Mid dorsum nose. Ulcerated.   BPH (benign prostatic hypertrophy)    Cancer (HCC)    basal cell   Chronic kidney disease    Dysrhythmia    GERD (gastroesophageal reflux disease)    Hyperlipidemia    Hypertension    Hypogonadism in  male    Osteoarthritis    Prediabetes    Squamous cell carcinoma of skin 10/27/2013   Right distal lateral thigh. SCCis   SVT (supraventricular tachycardia)     Surgical History:  Past Surgical History:  Procedure  Laterality Date   BASAL CELL CARCINOMA EXCISION     CARDIAC ELECTROPHYSIOLOGY MAPPING AND ABLATION     35yrs ago   cardio oblation     CHOLECYSTECTOMY     COLONOSCOPY WITH PROPOFOL N/A 04/12/2017   Procedure: COLONOSCOPY WITH PROPOFOL;  Surgeon: Midge Minium, MD;  Location: [facility];  Service: Endoscopy;  Laterality: N/A;   COLONOSCOPY WITH PROPOFOL N/A 09/25/2022   Procedure: COLONOSCOPY WITH PROPOFOL;  Surgeon: Midge Minium, MD;  Location: [facility];  Service: Endoscopy;  Laterality: N/A;   JOINT REPLACEMENT Left    shoulder   JOINT REPLACEMENT Right    shoulder   POLYPECTOMY N/A 04/12/2017   Procedure: POLYPECTOMY;  Surgeon: Midge Minium, MD;  Location: [facility];  Service: Endoscopy;  Laterality: N/A;   SHOULDER SURGERY Right 11/28/2018    Medications:  Current Outpatient Medications on File Prior to Visit  Medication Sig   aspirin 81 MG tablet Take 81 mg by mouth daily.   BD PEN NEEDLE NANO 2ND GEN 32G X 4 MM MISC USE 1 PEN NEEDLE EVERY MORNING   blood glucose meter kit and supplies KIT Dispense based on patient and insurance preference. Use up to four times daily as directed. (FOR ICD-9 250.00, 250.01).   Carbidopa-Levodopa ER (RYTARY) 48.75-195 MG CPCR Take 1 tablet by mouth 3 (three) times daily.   fluorouracil (EFUDEX) 5 % cream Apply topically 2 (two) times daily. Apply to the forehead, temples, and scalp BID x 10 days.   Multiple Vitamin (MULTIVITAMIN) tablet Take 1 tablet by mouth daily.   ONETOUCH VERIO test strip USE TO TEST BLOOD SUGAR UP TO FOUR TIMES DAILY AS DIRECTED   sildenafil (VIAGRA) 100 MG tablet Take 0.5-1 tablets (50-100 mg total) by mouth daily as needed for erectile dysfunction.   trihexyphenidyl (ARTANE) 2 MG tablet  TAKE 1 TABLET(2 MG) BY MOUTH THREE TIMES DAILY WITH MEALS   TRULICITY 3 MG/0.5ML SOPN INJECT 3 MG (0.5 ML) UNDER THE SKIN ONCE A WEEK AS DIRECTED   No current facility-administered medications on file prior to visit.    Allergies:  Allergies  Allergen Reactions   Amoxil [Amoxicillin] Swelling    Did it involve swelling of the face/tongue/throat, SOB, or low BP? No Did it involve sudden or severe rash/hives, skin peeling, or any reaction on the inside of your mouth or nose? No Did you need to seek medical attention at a hospital or doctor's office? No When did it last happen?      15 years ago If all above answers are "NO", may proceed with cephalosporin use.    Penicillin G Benzathine Swelling    Did it involve swelling of the face/tongue/throat, SOB, or low BP? No Did it involve sudden or severe rash/hives, skin peeling, or any reaction on the inside of your mouth or nose? No Did you need to seek medical attention at a hospital or doctor's office? No When did it last happen?      15 years ago If all above answers are "NO", may proceed with cephalosporin use.     Social History:  Social History   Socioeconomic History   Marital status: Married    Spouse name: Not on file   Number of children: 3   Years of education: Not on file   Highest education level: Associate degree: occupational, Scientist, product/process development, or vocational program  Occupational History   Occupation: retired    Comment: Curator  Tobacco Use   Smoking status: Never   Smokeless  tobacco: Never  Vaping Use   Vaping status: Never Used  Substance and Sexual Activity   Alcohol use: No    Alcohol/week: 0.0 standard drinks of alcohol   Drug use: No   Sexual activity: Yes  Other Topics Concern   Not on file  Social History Narrative   Married lives with spouse   Has 3 kids biological 1 step child   Right handed   Drinks coffee 2 cups a day, sweet tea with meals, 2-3 soda's a day   Live in one story   Social  Determinants of Health   Financial Resource Strain: Low Risk  (01/22/2023)   Overall Financial Resource Strain (CARDIA)    Difficulty of Paying Living Expenses: Not hard at all  Food Insecurity: No Food Insecurity (01/22/2023)   Hunger Vital Sign    Worried About Running Out of Food in the Last Year: Never true    Ran Out of Food in the Last Year: Never true  Transportation Needs: No Transportation Needs (01/22/2023)   PRAPARE - Administrator, Civil Service (Medical): No    Lack of Transportation (Non-Medical): No  Physical Activity: Insufficiently Active (01/22/2023)   Exercise Vital Sign    Days of Exercise per Week: 2 days    Minutes of Exercise per Session: 20 min  Stress: No Stress Concern Present (01/22/2023)   [employer] of Occupational Health - Occupational Stress Questionnaire    Feeling of Stress : Not at all  Social Connections: Socially Isolated (01/22/2023)   Social Connection and Isolation Panel [NHANES]    Frequency of Communication with Friends and Family: Twice a week    Frequency of Social Gatherings with Friends and Family: Never    Attends Religious Services: Never    Database administrator or Organizations: No    Attends Banker Meetings: Never    Marital Status: Married  Catering manager Violence: Not At Risk (01/22/2023)   Humiliation, Afraid, Rape, and Kick questionnaire    Fear of Current or Ex-Partner: No    Emotionally Abused: No    Physically Abused: No    Sexually Abused: No   Social History   Tobacco Use  Smoking Status Never  Smokeless Tobacco Never   Social History   Substance and Sexual Activity  Alcohol Use No   Alcohol/week: 0.0 standard drinks of alcohol    Family History:  Family History  Problem Relation Age of Onset   Cancer Mother        breast   Heart disease Father 51       CABG   Diabetes Father    CAD Father    Hyperlipidemia Father    Hypertension Father    Cancer Sister        breast   Liver  cancer Sister    Hypothyroidism Sister    Stroke Maternal Grandfather    Diabetes Paternal Grandmother     Past medical history, surgical history, medications, allergies, family history and social history reviewed with patient today and changes made to appropriate areas of the chart.   Review of Systems - negative All other ROS negative except what is listed above and in the HPI.      Objective:    BP 126/78 (BP Location: Left Arm, Patient Position: Sitting)   Pulse 76   Ht 5' 10.5" (1.791 m)   Wt 242 lb 3.2 oz (109.9 kg)   SpO2 97%   BMI 34.26 kg/m  Wt Readings from Last 3 Encounters:  08/12/23 242 lb 3.2 oz (109.9 kg)  04/03/23 250 lb 6.4 oz (113.6 kg)  02/08/23 251 lb 14.4 oz (114.3 kg)    Physical Exam Vitals and nursing note reviewed.  Constitutional:      General: He is awake. He is not in acute distress.    Appearance: He is well-developed and well-groomed. He is morbidly obese. He is not ill-appearing or toxic-appearing.  HENT:     Head: Normocephalic and atraumatic.     Right Ear: Hearing, tympanic membrane, ear canal and external ear normal. No drainage.     Left Ear: Hearing, tympanic membrane, ear canal and external ear normal. No drainage.     Nose: Nose normal.     Mouth/Throat:     Pharynx: Uvula midline.  Eyes:     General: Lids are normal.        Right eye: No discharge.        Left eye: No discharge.     Extraocular Movements: Extraocular movements intact.     Conjunctiva/sclera: Conjunctivae normal.     Pupils: Pupils are equal, round, and reactive to light.     Visual Fields: Right eye visual fields normal and left eye visual fields normal.  Neck:     Thyroid: No thyromegaly.     Vascular: No carotid bruit or JVD.     Trachea: Trachea normal.  Cardiovascular:     Rate and Rhythm: Normal rate and regular rhythm.     Heart sounds: Normal heart sounds, S1 normal and S2 normal. No murmur heard.    No gallop.  Pulmonary:     Effort: Pulmonary  effort is normal. No accessory muscle usage or respiratory distress.     Breath sounds: Normal breath sounds.  Abdominal:     General: Bowel sounds are normal.     Palpations: Abdomen is soft. There is no hepatomegaly or splenomegaly.     Tenderness: There is no abdominal tenderness.  Musculoskeletal:        General: Normal range of motion.     Cervical back: Normal range of motion and neck supple.     Right lower leg: No edema.     Left lower leg: No edema.  Lymphadenopathy:     Head:     Right side of head: No submental, submandibular, tonsillar, preauricular or posterior auricular adenopathy.     Left side of head: No submental, submandibular, tonsillar, preauricular or posterior auricular adenopathy.     Cervical: No cervical adenopathy.  Skin:    General: Skin is warm and dry.     Capillary Refill: Capillary refill takes less than 2 seconds.     Findings: No rash.  Neurological:     Mental Status: He is alert and oriented to person, place, and time.     Gait: Gait is intact.     Deep Tendon Reflexes: Reflexes are normal and symmetric.     Reflex Scores:      Brachioradialis reflexes are 2+ on the right side and 2+ on the left side.      Patellar reflexes are 2+ on the right side and 2+ on the left side. Psychiatric:        Attention and Perception: Attention normal.        Mood and Affect: Mood normal.        Speech: Speech normal.        Behavior: Behavior normal. Behavior is cooperative.  Thought Content: Thought content normal.        Cognition and Memory: Cognition normal.        Judgment: Judgment normal.    Diabetic Foot Exam - Simple   Simple Foot Form Visual Inspection No deformities, no ulcerations, no other skin breakdown bilaterally: Yes Sensation Testing Intact to touch and monofilament testing bilaterally: Yes Pulse Check Posterior Tibialis and Dorsalis pulse intact bilaterally: Yes Comments    Results for orders placed or performed in visit on  02/08/23  Bayer DCA Hb A1c Waived  Result Value Ref Range   HB A1C (BAYER DCA - WAIVED) 6.3 (H) 4.8 - 5.6 %  Microalbumin, Urine Waived  Result Value Ref Range   Microalb, Ur Waived 80 (H) 0 - 19 mg/L   Creatinine, Urine Waived 300 10 - 300 mg/dL   Microalb/Creat Ratio 30-300 (H) <30 mg/g  Comprehensive metabolic panel  Result Value Ref Range   Glucose 105 (H) 70 - 99 mg/dL   BUN 18 8 - 27 mg/dL   Creatinine, Ser 1.61 0.76 - 1.27 mg/dL   eGFR 69 >09 UE/AVW/0.98   BUN/Creatinine Ratio 16 10 - 24   Sodium 144 134 - 144 mmol/L   Potassium 4.2 3.5 - 5.2 mmol/L   Chloride 104 96 - 106 mmol/L   CO2 26 20 - 29 mmol/L   Calcium 9.1 8.6 - 10.2 mg/dL   Total Protein 6.6 6.0 - 8.5 g/dL   Albumin 4.8 3.9 - 4.9 g/dL   Globulin, Total 1.8 1.5 - 4.5 g/dL   Albumin/Globulin Ratio 2.7 (H) 1.2 - 2.2   Bilirubin Total 0.6 0.0 - 1.2 mg/dL   Alkaline Phosphatase 88 44 - 121 IU/L   AST 18 0 - 40 IU/L   ALT 23 0 - 44 IU/L  Lipid Panel w/o Chol/HDL Ratio  Result Value Ref Range   Cholesterol, Total 131 100 - 199 mg/dL   Triglycerides 119 (H) 0 - 149 mg/dL   HDL 33 (L) >14 mg/dL   VLDL Cholesterol Cal 33 5 - 40 mg/dL   LDL Chol Calc (NIH) 65 0 - 99 mg/dL      Assessment & Plan:   Problem List Items Addressed This Visit       Cardiovascular and Mediastinum   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HCC)   Relevant Medications   atorvastatin (LIPITOR) 20 MG tablet   metoprolol succinate (TOPROL-XL) 100 MG 24 hr tablet   omega-3 acid ethyl esters (LOVAZA) 1 g capsule   lisinopril (ZESTRIL) 2.5 MG tablet   metFORMIN (GLUCOPHAGE-XR) 500 MG 24 hr tablet   Other Relevant Orders   Bayer DCA Hb A1c Waived   Comprehensive metabolic panel     Digestive   GERD (gastroesophageal reflux disease)   Relevant Medications   lansoprazole (PREVACID) 30 MG capsule   Other Relevant Orders   Magnesium     Endocrine   Hyperlipidemia associated with type 2 diabetes mellitus (HCC)   Relevant  Medications   atorvastatin (LIPITOR) 20 MG tablet   metoprolol succinate (TOPROL-XL) 100 MG 24 hr tablet   omega-3 acid ethyl esters (LOVAZA) 1 g capsule   lisinopril (ZESTRIL) 2.5 MG tablet   metFORMIN (GLUCOPHAGE-XR) 500 MG 24 hr tablet   Other Relevant Orders   Bayer DCA Hb A1c Waived   Comprehensive metabolic panel   Lipid Panel w/o Chol/HDL Ratio   Type 2 diabetes mellitus with obesity (HCC) - Primary   Relevant Medications   atorvastatin (LIPITOR)  20 MG tablet   lisinopril (ZESTRIL) 2.5 MG tablet   metFORMIN (GLUCOPHAGE-XR) 500 MG 24 hr tablet   Other Relevant Orders   Bayer DCA Hb A1c Waived   Comprehensive metabolic panel     Nervous and Auditory   Parkinson's disease without dyskinesia or fluctuating manifestations   Relevant Medications   gabapentin (NEURONTIN) 100 MG capsule   Other Relevant Orders   CBC with Differential/Platelet   Comprehensive metabolic panel   TSH     Genitourinary   Benign prostatic hyperplasia   Relevant Orders   PSA     Other   Chronic anxiety   Relevant Medications   escitalopram (LEXAPRO) 10 MG tablet   Obesity   Relevant Medications   metFORMIN (GLUCOPHAGE-XR) 500 MG 24 hr tablet   Vitamin B12 deficiency   Relevant Orders   Vitamin B12   Other Visit Diagnoses     Need for Td vaccine       Td vaccine provided today, educated patient.   Relevant Orders   Td vaccine greater than or equal to 7yo preservative free IM   Encounter for annual physical exam       Annual physical today with labs and health maintenance reviewed, discussed with patient.        Discussed aspirin prophylaxis for myocardial infarction prevention and decision was made to continue ASA  LABORATORY TESTING:  Health maintenance labs ordered today as discussed above.   The natural history of prostate cancer and ongoing controversy regarding screening and potential treatment outcomes of prostate cancer has been discussed with the patient. The meaning of a  false positive PSA and a false negative PSA has been discussed. He indicates understanding of the limitations of this screening test and wishes to proceed with screening PSA testing.   IMMUNIZATIONS:   - Tdap: Tetanus vaccination status reviewed: Provided today in office - Influenza: Up to date - Pneumovax: Up to date  - Prevnar: Up To Date - Zostavax vaccine: Up to date  SCREENING: - Colonoscopy: Up to date due next 09/25/2029 Discussed with patient purpose of the colonoscopy is to detect colon cancer at curable precancerous or early stages   - AAA Screening: Not applicable  -Hearing Test: Not applicable  -Spirometry: Not applicable   PATIENT COUNSELING:    Sexuality: Discussed sexually transmitted diseases, partner selection, use of condoms, avoidance of unintended pregnancy  and contraceptive alternatives.   Advised to avoid cigarette smoking.  I discussed with the patient that most people either abstain from alcohol or drink within safe limits (<=14/week and <=4 drinks/occasion for males, <=7/weeks and <= 3 drinks/occasion for females) and that the risk for alcohol disorders and other health effects rises proportionally with the number of drinks per week and how often a drinker exceeds daily limits.  Discussed cessation/primary prevention of drug use and availability of treatment for abuse.   Diet: Encouraged to adjust caloric intake to maintain  or achieve ideal body weight, to reduce intake of dietary saturated fat and total fat, to limit sodium intake by avoiding high sodium foods and not adding table salt, and to maintain adequate dietary potassium and calcium preferably from fresh fruits, vegetables, and low-fat dairy products.    Stressed the importance of regular exercise  Injury prevention: Discussed safety belts, safety helmets, smoke detector, smoking near bedding or upholstery.   Dental health: Discussed importance of regular tooth brushing, flossing, and dental  visits.   Follow up plan: NEXT PREVENTATIVE PHYSICAL DUE IN 1 YEAR.  No follow-ups on file.

## 2023-08-12 NOTE — Assessment & Plan Note (Signed)
Chronic.  Followed by neurology, continue collaboration and current medication regimen as prescribed by them.  Recent note reviewed and labs.

## 2023-08-12 NOTE — Assessment & Plan Note (Signed)
Chronic, ongoing.  Continue current medication regimen and adjust as needed -- recommend he restart Atorvastatin and discussed his dizziness at length.  Wear compression hose at home during day.  Lipid panel today, recent LDL < 70.

## 2023-08-12 NOTE — Assessment & Plan Note (Signed)
Chronic, stable. Continue current medication regimen and adjust as needed.  Mag level today. 

## 2023-08-12 NOTE — Assessment & Plan Note (Signed)
BMI 34.26 with some further loss with T2DM, HTN/HLD -- praised for success.  Recommended eating smaller high protein, low fat meals more frequently and exercising 30 mins a day 5 times a week with a goal of 10-15lb weight loss in the next 3 months. Patient voiced their understanding and motivation to adhere to these recommendations.

## 2023-08-12 NOTE — Assessment & Plan Note (Signed)
Chronic, ongoing with A1c February 6.3%, recheck today and suspect it will still be stable. Will Continue Trulicity 3 MG and continue Metformin XR at 500 MG daily for ongoing diabetes control and weight loss support.  Urine ALB 80 February 2024, continue ACE daily.  Check BS 2-3 times a day and document + continue heavy focus on diet.  May benefit addition of SGLT2 for cardiac health in future to maintain A1c goal if elevations - with goal to maintain off insulin. CCM involved with patient.  Return in 6 months. - Foot and eye exam up to date - ACE and statin on board - Vaccinations up to date.

## 2023-08-12 NOTE — Assessment & Plan Note (Signed)
No current medications, PSA check today.

## 2023-08-12 NOTE — Assessment & Plan Note (Signed)
Noted low normal on October 2021 labs -- continue supplement and recheck today as is on chronic Metformin and concern for reduction in this level with chronic use. 

## 2023-08-12 NOTE — Progress Notes (Signed)
Contacted via MyChart   A1c is still fabulous!!  In fact it trended down to 5.8%.  Great job!!!! All without insulin.:)

## 2023-08-13 LAB — CBC WITH DIFFERENTIAL/PLATELET
Basophils Absolute: 0.1 10*3/uL (ref 0.0–0.2)
Basos: 1 %
EOS (ABSOLUTE): 0.4 10*3/uL (ref 0.0–0.4)
Eos: 6 %
Hematocrit: 45.6 % (ref 37.5–51.0)
Hemoglobin: 15.2 g/dL (ref 13.0–17.7)
Immature Grans (Abs): 0 10*3/uL (ref 0.0–0.1)
Immature Granulocytes: 0 %
Lymphocytes Absolute: 2.6 10*3/uL (ref 0.7–3.1)
Lymphs: 42 %
MCH: 29.9 pg (ref 26.6–33.0)
MCHC: 33.3 g/dL (ref 31.5–35.7)
MCV: 90 fL (ref 79–97)
Monocytes Absolute: 0.5 10*3/uL (ref 0.1–0.9)
Monocytes: 9 %
Neutrophils Absolute: 2.6 10*3/uL (ref 1.4–7.0)
Neutrophils: 42 %
Platelets: 148 10*3/uL — ABNORMAL LOW (ref 150–450)
RBC: 5.08 x10E6/uL (ref 4.14–5.80)
RDW: 13.1 % (ref 11.6–15.4)
WBC: 6.1 10*3/uL (ref 3.4–10.8)

## 2023-08-13 LAB — COMPREHENSIVE METABOLIC PANEL
ALT: 13 IU/L (ref 0–44)
AST: 16 IU/L (ref 0–40)
Albumin: 4.4 g/dL (ref 3.9–4.9)
Alkaline Phosphatase: 88 IU/L (ref 44–121)
BUN/Creatinine Ratio: 13 (ref 10–24)
BUN: 16 mg/dL (ref 8–27)
Bilirubin Total: 0.6 mg/dL (ref 0.0–1.2)
CO2: 25 mmol/L (ref 20–29)
Calcium: 9.4 mg/dL (ref 8.6–10.2)
Chloride: 105 mmol/L (ref 96–106)
Creatinine, Ser: 1.21 mg/dL (ref 0.76–1.27)
Globulin, Total: 2.4 g/dL (ref 1.5–4.5)
Glucose: 94 mg/dL (ref 70–99)
Potassium: 4.4 mmol/L (ref 3.5–5.2)
Sodium: 143 mmol/L (ref 134–144)
Total Protein: 6.8 g/dL (ref 6.0–8.5)
eGFR: 66 mL/min/{1.73_m2} (ref >59)

## 2023-08-13 LAB — LIPID PANEL W/O CHOL/HDL RATIO
Cholesterol, Total: 174 mg/dL (ref 100–199)
HDL: 29 mg/dL — ABNORMAL LOW (ref >39)
LDL Chol Calc (NIH): 100 mg/dL — ABNORMAL HIGH (ref 0–99)
Triglycerides: 262 mg/dL — ABNORMAL HIGH (ref 0–149)
VLDL Cholesterol Cal: 45 mg/dL — ABNORMAL HIGH (ref 5–40)

## 2023-08-13 LAB — MAGNESIUM: Magnesium: 1.9 mg/dL (ref 1.6–2.3)

## 2023-08-13 LAB — PSA: Prostate Specific Ag, Serum: 0.3 ng/mL (ref 0.0–4.0)

## 2023-08-13 LAB — TSH: TSH: 3.71 u[IU]/mL (ref 0.450–4.500)

## 2023-08-13 LAB — VITAMIN B12: Vitamin B-12: 311 pg/mL (ref 232–1245)

## 2023-08-13 NOTE — Progress Notes (Signed)
Contacted via MyChart   Good evening Brian Frederick, your labs have returned and overall look good with a few exceptions: - Platelets remain a little on low side but have trended upwards since last visit.  We will continue to monitor this.  I would change to taking Aspirin 3 days a week. - Cholesterol levels more elevated this check, are you taking Atorvastatin daily?  Did you fast for these labs?  Please message me and let me know. - B12 a little on low side, please ensure you are taking Vitamin B12 1000 MCG daily.  Any questions? Keep being awesome!!  Thank you for allowing me to participate in your care.  I appreciate you. Kindest regards, Tehillah Cipriani

## 2023-08-15 ENCOUNTER — Ambulatory Visit
Admission: RE | Admit: 2023-08-15 | Discharge: 2023-08-15 | Disposition: A | Discharge status: Home / Self Care | Payer: Medicare Other | Source: Ambulatory Visit | Attending: Orthopaedic Surgery | Admitting: Orthopaedic Surgery

## 2023-08-15 DIAGNOSIS — M25512 Pain in left shoulder: Secondary | ICD-10-CM | POA: Diagnosis not present

## 2023-08-15 DIAGNOSIS — M129 Arthropathy, unspecified: Secondary | ICD-10-CM | POA: Diagnosis not present

## 2023-08-15 DIAGNOSIS — Z96612 Presence of left artificial shoulder joint: Secondary | ICD-10-CM | POA: Diagnosis not present

## 2023-08-15 DIAGNOSIS — I700 Atherosclerosis of aorta: Secondary | ICD-10-CM | POA: Diagnosis not present

## 2023-08-27 DIAGNOSIS — M25512 Pain in left shoulder: Secondary | ICD-10-CM | POA: Diagnosis not present

## 2023-08-29 DIAGNOSIS — H43813 Vitreous degeneration, bilateral: Secondary | ICD-10-CM | POA: Diagnosis not present

## 2023-08-29 DIAGNOSIS — H2513 Age-related nuclear cataract, bilateral: Secondary | ICD-10-CM | POA: Diagnosis not present

## 2023-08-29 DIAGNOSIS — E119 Type 2 diabetes mellitus without complications: Secondary | ICD-10-CM | POA: Diagnosis not present

## 2023-08-29 DIAGNOSIS — H0100A Unspecified blepharitis right eye, upper and lower eyelids: Secondary | ICD-10-CM | POA: Diagnosis not present

## 2023-08-29 LAB — HM DIABETES EYE EXAM

## 2023-09-01 ENCOUNTER — Other Ambulatory Visit: Payer: Self-pay | Admitting: Nurse Practitioner

## 2023-09-02 NOTE — Telephone Encounter (Signed)
Requested Prescriptions  Pending Prescriptions Disp Refills   TRULICITY 3 MG/0.5ML SOPN [Pharmacy Med Name: Trulicity Subcutaneous Solution Pen-injector 3 MG/0.5ML] 6 mL 0    Sig: INJECT 3 MG (0.5 ML) UNDER THE SKIN ONCE A WEEK     Endocrinology:  Diabetes - GLP-1 Receptor Agonists Passed - 09/01/2023  8:05 AM      Passed - HBA1C is between 0 and 7.9 and within 180 days    HB A1C (BAYER DCA - WAIVED)  Date Value Ref Range Status  08/12/2023 5.8 (H) 4.8 - 5.6 % Final    Comment:             Prediabetes: 5.7 - 6.4          Diabetes: >6.4          Glycemic control for adults with diabetes: <7.0          Passed - Valid encounter within last 6 months    Recent Outpatient Visits           3 weeks ago Type 2 diabetes mellitus with obesity (HCC)   [place] [facility] [place], [place] T, NP   6 months ago Type 2 diabetes mellitus with obesity (HCC)   [place] [facility] [place], Catharine T, NP   1 year ago Type 2 diabetes mellitus with obesity (HCC)   [place] [facility] [place], Corrie Dandy T, NP   1 year ago Type 2 diabetes mellitus with obesity (HCC)   [facility] [place], Corrie Dandy T, NP   1 year ago Type 2 diabetes mellitus with obesity (HCC)   [place] [facility] Marjie Skiff, NP       Future Appointments             In 1 month Deirdre Evener, MD [facility]   In 5 months [place], Dorie Rank, NP [facility]

## 2023-09-04 ENCOUNTER — Encounter: Payer: Self-pay | Admitting: Nurse Practitioner

## 2023-09-18 DIAGNOSIS — M25512 Pain in left shoulder: Secondary | ICD-10-CM | POA: Diagnosis not present

## 2023-09-20 DIAGNOSIS — M25512 Pain in left shoulder: Secondary | ICD-10-CM | POA: Diagnosis not present

## 2023-09-24 DIAGNOSIS — M25512 Pain in left shoulder: Secondary | ICD-10-CM | POA: Diagnosis not present

## 2023-09-24 DIAGNOSIS — M25612 Stiffness of left shoulder, not elsewhere classified: Secondary | ICD-10-CM | POA: Diagnosis not present

## 2023-09-26 DIAGNOSIS — M25512 Pain in left shoulder: Secondary | ICD-10-CM | POA: Diagnosis not present

## 2023-09-26 DIAGNOSIS — M25612 Stiffness of left shoulder, not elsewhere classified: Secondary | ICD-10-CM | POA: Diagnosis not present

## 2023-10-01 DIAGNOSIS — M25512 Pain in left shoulder: Secondary | ICD-10-CM | POA: Diagnosis not present

## 2023-10-01 DIAGNOSIS — M25612 Stiffness of left shoulder, not elsewhere classified: Secondary | ICD-10-CM | POA: Diagnosis not present

## 2023-10-08 DIAGNOSIS — M25612 Stiffness of left shoulder, not elsewhere classified: Secondary | ICD-10-CM | POA: Diagnosis not present

## 2023-10-08 DIAGNOSIS — M25512 Pain in left shoulder: Secondary | ICD-10-CM | POA: Diagnosis not present

## 2023-10-10 NOTE — Progress Notes (Signed)
Assessment/Plan:   1.  Parkinsons Disease, diagnosed January, 2019.  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  -Increase Rytary 195 mg, 2/2/1.  Titration schedule given  -For now he will continue trihexyphenidyl, 2 mg 3 times per day.  He has no side effects with it.  -He follows with dermatology regularly.  Last appt 02/21/23  -not interested in surgical interventions  -discussed requip but he has had EDS with pramipexole and doesn't want to take that risk   2.  Diabetes mellitus  -well controlled with last A1c 5.8  3.  Shoulder pain  -he is following with [facility] in [place] (had prior surgery with [facility] in [place])  4.  Morning headache  -declines PSG.  Discussed morbidity/mortality with untreated osa  -could be from positioning of head/neck at night but would guess that OSA is greater risk.  Some neck pain.  He declines MRI cervical spine stating [facility] has done that.  I don't have records as they are out of system  Subjective:   Brian Frederick was seen today in follow up for Parkinsons disease.  My previous records were reviewed prior to todays visit as well as outside records available to me.  Last visit, we started Rytary.  He is tolerating that much better.  He has pretty mild nausea now.  He is not exercising much.  He has been seeing sports med at [facility] in [place] for shoulder.  He is in PT.  That is helping some.  Separately, he is having bilateral occipital headache when awakening.     Current prescribed movement disorder medications: Rytary 195 mg 3 times per day (started last visit) Trihexyphenidyl, 2 mg 3 times per day   PREVIOUS MEDICATIONS: Pramipexole caused excessive daytime hypersomnolence (fell asleep while driving and may have caused compulsive eating); carbidopa/levodopa IR (nausea); carbidopa/levodopa CR (still with nausea)  ALLERGIES:   Allergies  Allergen Reactions   Amoxil [Amoxicillin] Swelling    Did it involve swelling  of the face/tongue/throat, SOB, or low BP? No Did it involve sudden or severe rash/hives, skin peeling, or any reaction on the inside of your mouth or nose? No Did you need to seek medical attention at a hospital or doctor's office? No When did it last happen?      15 years ago If all above answers are "NO", may proceed with cephalosporin use.    Penicillin G Benzathine Swelling    Did it involve swelling of the face/tongue/throat, SOB, or low BP? No Did it involve sudden or severe rash/hives, skin peeling, or any reaction on the inside of your mouth or nose? No Did you need to seek medical attention at a hospital or doctor's office? No When did it last happen?      15 years ago If all above answers are "NO", may proceed with cephalosporin use.     CURRENT MEDICATIONS:  Outpatient Encounter Medications as of 10/16/2023  Medication Sig   aspirin 81 MG tablet Take 81 mg by mouth daily.   atorvastatin (LIPITOR) 20 MG tablet Take 1 tablet (20 mg total) by mouth daily.   BD PEN NEEDLE NANO 2ND GEN 32G X 4 MM MISC USE 1 PEN NEEDLE EVERY MORNING   blood glucose meter kit and supplies KIT Dispense based on patient and insurance preference. Use up to four times daily as directed. (FOR ICD-9 250.00, 250.01).   Carbidopa-Levodopa ER (RYTARY) 48.75-195 MG CPCR Take 1 tablet by mouth 3 (three) times daily.  escitalopram (LEXAPRO) 10 MG tablet Take 1 tablet (10 mg total) by mouth daily.   fluorouracil (EFUDEX) 5 % cream Apply topically 2 (two) times daily. Apply to the forehead, temples, and scalp BID x 10 days.   gabapentin (NEURONTIN) 100 MG capsule TAKE 2 CAPSULE (200 MG) BY MOUTH TWICE A DAY.   lansoprazole (PREVACID) 30 MG capsule TAKE ONE CAPSULE BY MOUTH DAILY AT 12 NOON   lisinopril (ZESTRIL) 2.5 MG tablet Take 1 tablet (2.5 mg total) by mouth daily.   meloxicam (MOBIC) 15 MG tablet Take 1 tablet (15 mg total) by mouth daily as needed for pain.   metFORMIN (GLUCOPHAGE-XR) 500 MG 24 hr tablet  Take 1 tablet (500 mg total) by mouth at bedtime.   metoprolol succinate (TOPROL-XL) 100 MG 24 hr tablet TAKE 1 TABLET BY MOUTH EVERY DAY WITH FOOD OR IMMEDIATELY AFTER A MEAL   Multiple Vitamin (MULTIVITAMIN) tablet Take 1 tablet by mouth daily.   omega-3 acid ethyl esters (LOVAZA) 1 g capsule Take 1 capsule (1 g total) by mouth 2 (two) times daily.   ONETOUCH VERIO test strip USE TO TEST BLOOD SUGAR UP TO FOUR TIMES DAILY AS DIRECTED   sildenafil (VIAGRA) 100 MG tablet Take 0.5-1 tablets (50-100 mg total) by mouth daily as needed for erectile dysfunction.   trihexyphenidyl (ARTANE) 2 MG tablet TAKE 1 TABLET(2 MG) BY MOUTH THREE TIMES DAILY WITH MEALS   TRULICITY 3 MG/0.5ML SOPN INJECT 3 MG (0.5 ML) UNDER THE SKIN ONCE A WEEK   No facility-administered encounter medications on file as of 10/16/2023.    Objective:   PHYSICAL EXAMINATION:    VITALS:   There were no vitals filed for this visit.    Wt Readings from Last 3 Encounters:  08/12/23 242 lb 3.2 oz (109.9 kg)  04/03/23 250 lb 6.4 oz (113.6 kg)  02/08/23 251 lb 14.4 oz (114.3 kg)     GEN:  The patient appears stated age and is in NAD. HEENT:  Normocephalic, atraumatic.  The mucous membranes are moist. The superficial temporal arteries are without ropiness or tenderness. CV:  RRR Lungs:  CTAB Neck/HEME:  There are no carotid bruits bilaterally.  Neurological examination:  Orientation: The patient is alert and oriented x3. Cranial nerves: There is good facial symmetry without facial hypomimia. The speech is fluent and clear. Soft palate rises symmetrically and there is no tongue deviation. Hearing is intact to conversational tone. Sensation: Sensation is intact to light touch throughout Motor: Strength is at least antigravity x4.  Movement examination: Tone: There is mod increased tone in the LUE Abnormal movements: There is left upper extremity rest tremor Coordination:  There is mild significant decremation, with any  form of RAMS, including alternating supination and pronation of the forearm, hand opening and closing, finger taps, heel taps and toe taps on the L and mild decremation with finger taps on the R. Gait and Station: The patient has no difficulty arising out of a deep-seated chair without the use of the hands. The patient's stride length is slightly decreased but he drags the L leg and appears to have a bit of L foot drop with ambulation  Examination is all stable from last visit  I have reviewed and interpreted the following labs independently    Chemistry      Component Value Date/Time   NA 143 08/12/2023 0905   NA 142 09/02/2013 0220   K 4.4 08/12/2023 0905   K 4.2 09/02/2013 0220   CL 105  08/12/2023 0905   CL 107 09/02/2013 0220   CO2 25 08/12/2023 0905   CO2 25 09/02/2013 0220   BUN 16 08/12/2023 0905   BUN 23 (H) 09/02/2013 0220   CREATININE 1.21 08/12/2023 0905   CREATININE 1.52 (H) 09/02/2013 0220      Component Value Date/Time   CALCIUM 9.4 08/12/2023 0905   CALCIUM 9.4 09/02/2013 0220   ALKPHOS 88 08/12/2023 0905   ALKPHOS 107 09/02/2013 0220   AST 16 08/12/2023 0905   AST 30 09/02/2013 0220   ALT 13 08/12/2023 0905   ALT 38 09/02/2013 0220   BILITOT 0.6 08/12/2023 0905   BILITOT 0.3 09/02/2013 0220       Lab Results  Component Value Date   WBC 6.1 08/12/2023   HGB 15.2 08/12/2023   HCT 45.6 08/12/2023   MCV 90 08/12/2023   PLT 148 (L) 08/12/2023    Lab Results  Component Value Date   TSH 3.710 08/12/2023   Lab Results  Component Value Date   HGBA1C 5.8 (H) 08/12/2023    Total time spent on today's visit was 30 minutes, including both face-to-face time and nonface-to-face time.  Time included that spent on review of records (prior notes available to me/labs/imaging if pertinent), discussing treatment and goals, answering patient's questions and coordinating care.   Cc:  Marjie Skiff, NP

## 2023-10-14 ENCOUNTER — Other Ambulatory Visit: Payer: Self-pay | Admitting: Nurse Practitioner

## 2023-10-15 NOTE — Telephone Encounter (Signed)
Refused the Lisinopril 5 mg because it was discontinued on 08/12/2023 due to a dose change.   Now on 2.5 mg.     Requested Prescriptions  Pending Prescriptions Disp Refills   lisinopril (ZESTRIL) 5 MG tablet [Pharmacy Med Name: LISINOPRIL 5MG  TABLETS] 90 tablet 1    Sig: TAKE 1 TABLET(5 MG) BY MOUTH DAILY     Cardiovascular:  ACE Inhibitors Passed - 10/14/2023  7:06 AM      Passed - Cr in normal range and within 180 days    Creatinine  Date Value Ref Range Status  09/02/2013 1.52 (H) 0.60 - 1.30 mg/dL Final   Creatinine, Ser  Date Value Ref Range Status  08/12/2023 1.21 0.76 - 1.27 mg/dL Final         Passed - K in normal range and within 180 days    Potassium  Date Value Ref Range Status  08/12/2023 4.4 3.5 - 5.2 mmol/L Final  09/02/2013 4.2 3.5 - 5.1 mmol/L Final         Passed - Patient is not pregnant      Passed - Last BP in normal range    BP Readings from Last 1 Encounters:  08/12/23 126/78         Passed - Valid encounter within last 6 months    Recent Outpatient Visits           2 months ago Type 2 diabetes mellitus with obesity (HCC)   [place] [facility] [place], [place] T, NP   8 months ago Type 2 diabetes mellitus with obesity (HCC)   [place] [facility] [place], Corrie Dandy T, NP   1 year ago Type 2 diabetes mellitus with obesity (HCC)   [place] [facility] [place], Corrie Dandy T, NP   1 year ago Type 2 diabetes mellitus with obesity (HCC)   [place] [facility] [place], Corrie Dandy T, NP   1 year ago Type 2 diabetes mellitus with obesity (HCC)   [place] [facility] Marjie Skiff, NP       Future Appointments             In 2 weeks Deirdre Evener, MD [facility]   In 4 months [place], Dorie Rank, NP [facility]

## 2023-10-16 ENCOUNTER — Encounter: Payer: Self-pay | Admitting: Neurology

## 2023-10-16 ENCOUNTER — Ambulatory Visit: Payer: Medicare Other | Admitting: Neurology

## 2023-10-16 VITALS — BP 116/72 | HR 71 | Ht 71.0 in | Wt 240.8 lb

## 2023-10-16 DIAGNOSIS — R519 Headache, unspecified: Secondary | ICD-10-CM | POA: Diagnosis not present

## 2023-10-16 DIAGNOSIS — G20A1 Parkinson's disease without dyskinesia, without mention of fluctuations: Secondary | ICD-10-CM | POA: Diagnosis not present

## 2023-10-16 DIAGNOSIS — M25612 Stiffness of left shoulder, not elsewhere classified: Secondary | ICD-10-CM | POA: Diagnosis not present

## 2023-10-16 DIAGNOSIS — M25512 Pain in left shoulder: Secondary | ICD-10-CM | POA: Diagnosis not present

## 2023-10-16 MED ORDER — RYTARY 48.75-195 MG PO CPCR: ORAL_CAPSULE | ORAL | Status: DC

## 2023-10-16 NOTE — Patient Instructions (Addendum)
Take rytary, 2 capsules at 8am/1 at 1pm/1 at 6pm x 2 weeks and then take rytary 2 capsules at 8am/2 at 1pm/1 at 6pm

## 2023-10-22 DIAGNOSIS — M25612 Stiffness of left shoulder, not elsewhere classified: Secondary | ICD-10-CM | POA: Diagnosis not present

## 2023-10-22 DIAGNOSIS — M25512 Pain in left shoulder: Secondary | ICD-10-CM | POA: Diagnosis not present

## 2023-10-31 ENCOUNTER — Ambulatory Visit: Payer: Medicare Other | Admitting: Dermatology

## 2023-10-31 DIAGNOSIS — L814 Other melanin hyperpigmentation: Secondary | ICD-10-CM

## 2023-10-31 DIAGNOSIS — L821 Other seborrheic keratosis: Secondary | ICD-10-CM | POA: Diagnosis not present

## 2023-10-31 DIAGNOSIS — L578 Other skin changes due to chronic exposure to nonionizing radiation: Secondary | ICD-10-CM | POA: Diagnosis not present

## 2023-10-31 DIAGNOSIS — L820 Inflamed seborrheic keratosis: Secondary | ICD-10-CM

## 2023-10-31 DIAGNOSIS — Z7189 Other specified counseling: Secondary | ICD-10-CM

## 2023-10-31 DIAGNOSIS — L570 Actinic keratosis: Secondary | ICD-10-CM

## 2023-10-31 DIAGNOSIS — W908XXA Exposure to other nonionizing radiation, initial encounter: Secondary | ICD-10-CM | POA: Diagnosis not present

## 2023-10-31 DIAGNOSIS — S00411A Abrasion of right ear, initial encounter: Secondary | ICD-10-CM

## 2023-10-31 NOTE — Progress Notes (Signed)
Follow-Up Visit   Subjective  Brian Frederick is a 67 y.o. male who presents for the following: Ak follow up of the face and scalp, pt did use topical 5FU a few weeks ago and did notice a reaction.   The patient has spots, moles and lesions to be evaluated, some may be new or changing and the patient may have concern these could be cancer.   The following portions of the chart were reviewed this encounter and updated as appropriate: medications, allergies, medical history  Review of Systems:  No other skin or systemic complaints except as noted in HPI or Assessment and Plan.  Objective  Well appearing patient in no apparent distress; mood and affect are within normal limits.   A focused examination was performed of the following areas: the face, scalp, ears, arms, and hands.   Relevant exam findings are noted in the Assessment and Plan.  Scalp and ears x 8 (8) Erythematous thin papules/macules with gritty scale.   R ear above th crus Erythematous stuck-on, waxy papule or plaque    Assessment & Plan   ACTINIC DAMAGE WITH PRECANCEROUS ACTINIC KERATOSES Counseling for Topical Chemotherapy Management: Patient exhibits: - Severe, confluent actinic changes with pre-cancerous actinic keratoses that is secondary to cumulative UV radiation exposure over time - Condition that is severe; chronic, not at goal. - diffuse scaly erythematous macules and papules with underlying dyspigmentation - Discussed Prescription "Field Treatment" topical Chemotherapy for Severe, Chronic Confluent Actinic Changes with Pre-Cancerous Actinic Keratoses Field treatment involves treatment of an entire area of skin that has confluent Actinic Changes (Sun/ Ultraviolet light damage) and PreCancerous Actinic Keratoses by method of PhotoDynamic Therapy (PDT) and/or prescription Topical Chemotherapy agents such as 5-fluorouracil, 5-fluorouracil/calcipotriene, and/or imiquimod.  The purpose is to decrease the  number of clinically evident and subclinical PreCancerous lesions to prevent progression to development of skin cancer by chemically destroying early precancer changes that may or may not be visible.  It has been shown to reduce the risk of developing skin cancer in the treated area. As a result of treatment, redness, scaling, crusting, and open sores may occur during treatment course. One or more than one of these methods may be used and may have to be used several times to control, suppress and eliminate the PreCancerous changes. Discussed treatment course, expected reaction, and possible side effects. - Recommend daily broad spectrum sunscreen SPF 30+ to sun-exposed areas, reapply every 2 hours as needed.  - Staying in the shade or wearing long sleeves, sun glasses (UVA+UVB protection) and wide brim hats (4-inch brim around the entire circumference of the hat) are also recommended. - Call for new or changing lesions.  SEBORRHEIC KERATOSIS - Stuck-on, waxy, tan-brown papules and/or plaques  - Benign-appearing - Discussed benign etiology and prognosis. - Observe - Call for any changes  LENTIGINES Exam: scattered tan macules Due to sun exposure Treatment Plan: Benign-appearing, observe. Recommend daily broad spectrum sunscreen SPF 30+ to sun-exposed areas, reapply every 2 hours as needed.  Call for any changes  AK (actinic keratosis) (8) Scalp and ears x 8  Actinic keratoses are precancerous spots that appear secondary to cumulative UV radiation exposure/sun exposure over time. They are chronic with expected duration over 1 year. A portion of actinic keratoses will progress to squamous cell carcinoma of the skin. It is not possible to reliably predict which spots will progress to skin cancer and so treatment is recommended to prevent development of skin cancer.  Recommend daily broad spectrum  sunscreen SPF 30+ to sun-exposed areas, reapply every 2 hours as needed.  Recommend staying in the  shade or wearing long sleeves, sun glasses (UVA+UVB protection) and wide brim hats (4-inch brim around the entire circumference of the hat). Call for new or changing lesions.   Destruction of lesion - Scalp and ears x 8 (8) Complexity: simple   Destruction method: cryotherapy   Informed consent: discussed and consent obtained   Timeout:  patient name, date of birth, surgical site, and procedure verified Lesion destroyed using liquid nitrogen: Yes   Region frozen until ice ball extended beyond lesion: Yes   Outcome: patient tolerated procedure well with no complications   Post-procedure details: wound care instructions given    Inflamed seborrheic keratosis R ear above th crus  Symptomatic, irritating, patient would like treated.   Destruction of lesion - R ear above th crus Complexity: simple   Destruction method: cryotherapy   Informed consent: discussed and consent obtained   Timeout:  patient name, date of birth, surgical site, and procedure verified Lesion destroyed using liquid nitrogen: Yes   Region frozen until ice ball extended beyond lesion: Yes   Outcome: patient tolerated procedure well with no complications   Post-procedure details: wound care instructions given    Excoriation/crust on the R ear likely related to trauma  - Benign appearing, RTC if not resolved in 6-8 wks.   Return in about 6 months (around 04/29/2024) for AK follow up .  Maylene Roes, CMA, am acting as scribe for Armida Sans, MD .   Documentation: I have reviewed the above documentation for accuracy and completeness, and I agree with the above.  Armida Sans, MD

## 2023-10-31 NOTE — Patient Instructions (Signed)

## 2023-11-09 ENCOUNTER — Encounter: Payer: Self-pay | Admitting: Dermatology

## 2023-11-10 ENCOUNTER — Other Ambulatory Visit: Payer: Self-pay | Admitting: Nurse Practitioner

## 2023-11-12 NOTE — Telephone Encounter (Signed)
Requested Prescriptions  Pending Prescriptions Disp Refills   TRULICITY 3 MG/0.5ML SOAJ [Pharmacy Med Name: Trulicity Subcutaneous Solution Auto-injector 3 MG/0.5ML] 6 mL 0    Sig: INJECT 3 MG (0.5 ML) UNDER THE SKIN ONCE A WEEK     Endocrinology:  Diabetes - GLP-1 Receptor Agonists Passed - 11/10/2023  8:08 AM      Passed - HBA1C is between 0 and 7.9 and within 180 days    HB A1C (BAYER DCA - WAIVED)  Date Value Ref Range Status  08/12/2023 5.8 (H) 4.8 - 5.6 % Final    Comment:             Prediabetes: 5.7 - 6.4          Diabetes: >6.4          Glycemic control for adults with diabetes: <7.0          Passed - Valid encounter within last 6 months    Recent Outpatient Visits           3 months ago Type 2 diabetes mellitus with obesity (HCC)   [place] [facility] [place],  T, NP   9 months ago Type 2 diabetes mellitus with obesity (HCC)   [place] [facility] [place], [place] T, NP   1 year ago Type 2 diabetes mellitus with obesity (HCC)   [place] [facility] [place], Corrie Dandy T, NP   1 year ago Type 2 diabetes mellitus with obesity (HCC)   [place] [facility] [place], [employer] T, NP   2 years ago Type 2 diabetes mellitus with obesity (HCC)   [place] [facility] [place], Dorie Rank, NP       Future Appointments             In 3 months [place], Dorie Rank, NP [facility]   In 5 months Deirdre Evener, MD [facility]

## 2023-12-02 ENCOUNTER — Other Ambulatory Visit: Payer: Self-pay | Admitting: Neurology

## 2023-12-02 DIAGNOSIS — G20A1 Parkinson's disease without dyskinesia, without mention of fluctuations: Secondary | ICD-10-CM

## 2023-12-04 ENCOUNTER — Telehealth: Payer: Self-pay | Admitting: Neurology

## 2023-12-04 MED ORDER — RYTARY 48.75-195 MG PO CPCR
ORAL_CAPSULE | ORAL | 0 refills | Status: DC
Start: 2023-12-04 — End: 2024-03-06

## 2023-12-04 NOTE — Telephone Encounter (Signed)
Called pharmacy and got everything straightened out

## 2023-12-04 NOTE — Telephone Encounter (Signed)
Caller stated he is having trouble with medication that was sent to pharmacy today. Asked for a call back so he can get medication

## 2024-01-08 ENCOUNTER — Ambulatory Visit: Payer: Self-pay | Admitting: *Deleted

## 2024-01-08 NOTE — Telephone Encounter (Signed)
  Chief Complaint: Since night before last he is having lower back pain with radiation down his right leg to his knee.   It's sharp when he stands up.  Been seeing chiropractor.   Went yesterday but it did not help.  He has an issue at L4 per chiropractor. Symptoms: above Frequency: Since night before last it suddenly got worse.  Even going to the chiropractor yesterday did not help Pertinent Negatives: Patient denies heat, ice or OTC medications helping.   Using a vibrator has not helped either. Disposition: [] ED /[] Urgent Care (no appt availability in office) / [x] Appointment(In office/virtual)/ []  [place] [facility]/ [] Home Care/ [] Refused Recommended Disposition /[] [place] [facility]/ []  Follow-up with PCP Additional Notes: Appt made with Aura Dials, NP for 01/09/2024 at 9:40.

## 2024-01-08 NOTE — Telephone Encounter (Signed)
Reason for Disposition  [1] Pain radiates into the thigh or further down the leg AND [2] one leg  Answer Assessment - Initial Assessment Questions 1. ONSET: "When did the pain begin?"      He is having back pain when he stands up and lays down.   He is having nausea.   I called my pain dr (wife on line but he is there with him).   He has not lifted or had any injuries.   He has been going to the chiropractor and was doing better but now getting worse.   It's lower back pain. 2. LOCATION: "Where does it hurt?" (upper, mid or lower back)     Having pain down right leg to his knee.   It's an ache then when he stands up it's a sharp pain and it makes me sweat.    3. SEVERITY: "How bad is the pain?"  (e.g., Scale 1-10; mild, moderate, or severe)   - MILD (1-3): Doesn't interfere with normal activities.    - MODERATE (4-7): Interferes with normal activities or awakens from sleep.    - SEVERE (8-10): Excruciating pain, unable to do any normal activities.      Severe.   4. PATTERN: "Is the pain constant?" (e.g., yes, no; constant, intermittent)      It's constant 5. RADIATION: "Does the pain shoot into your legs or somewhere else?"     Yes down right leg 6. CAUSE:  "What do you think is causing the back pain?"      I don't know.  7. BACK OVERUSE:  "Any recent lifting of heavy objects, strenuous work or exercise?"     No 8. MEDICINES: "What have you taken so far for the pain?" (e.g., nothing, acetaminophen, NSAIDS)     He has done heat and ice, vibrating and OTC medication not helping. 9. NEUROLOGIC SYMPTOMS: "Do you have any weakness, numbness, or problems with bowel/bladder control?"     Sharp pain down leg 10. OTHER SYMPTOMS: "Do you have any other symptoms?" (e.g., fever, abdomen pain, burning with urination, blood in urine)       No urinary symptoms no kidney stone history.   11. PREGNANCY: "Is there any chance you are pregnant?" "When was your last menstrual period?"       N/A  Protocols  used: Back Pain-A-AH

## 2024-01-09 ENCOUNTER — Ambulatory Visit (INDEPENDENT_AMBULATORY_CARE_PROVIDER_SITE_OTHER): Payer: Medicare Other | Admitting: Nurse Practitioner

## 2024-01-09 ENCOUNTER — Ambulatory Visit
Admission: RE | Admit: 2024-01-09 | Discharge: 2024-01-09 | Disposition: A | Discharge status: Home / Self Care | Payer: Medicare Other | Attending: Nurse Practitioner | Admitting: Nurse Practitioner

## 2024-01-09 ENCOUNTER — Encounter: Payer: Self-pay | Admitting: Nurse Practitioner

## 2024-01-09 ENCOUNTER — Ambulatory Visit
Admission: RE | Admit: 2024-01-09 | Discharge: 2024-01-09 | Disposition: A | Discharge status: Home / Self Care | Payer: Medicare Other | Source: Ambulatory Visit | Attending: Nurse Practitioner | Admitting: Nurse Practitioner

## 2024-01-09 VITALS — BP 129/79 | HR 88 | Temp 98.9°F | Wt 234.2 lb

## 2024-01-09 DIAGNOSIS — M48061 Spinal stenosis, lumbar region without neurogenic claudication: Secondary | ICD-10-CM | POA: Diagnosis not present

## 2024-01-09 DIAGNOSIS — G8929 Other chronic pain: Secondary | ICD-10-CM | POA: Diagnosis not present

## 2024-01-09 DIAGNOSIS — M5441 Lumbago with sciatica, right side: Secondary | ICD-10-CM | POA: Insufficient documentation

## 2024-01-09 DIAGNOSIS — M47816 Spondylosis without myelopathy or radiculopathy, lumbar region: Secondary | ICD-10-CM | POA: Diagnosis not present

## 2024-01-09 DIAGNOSIS — M51362 Other intervertebral disc degeneration, lumbar region with discogenic back pain and lower extremity pain: Secondary | ICD-10-CM | POA: Diagnosis not present

## 2024-01-09 DIAGNOSIS — M545 Low back pain, unspecified: Secondary | ICD-10-CM | POA: Diagnosis not present

## 2024-01-09 MED ORDER — TRIAMCINOLONE ACETONIDE 40 MG/ML IJ SUSP
40.0000 mg | Freq: Once | INTRAMUSCULAR | Status: AC
  Administered 2024-01-09: 40 mg via INTRAMUSCULAR

## 2024-01-09 MED ORDER — PREDNISONE 10 MG (21) PO TBPK: ORAL_TABLET | ORAL | 0 refills | Status: DC

## 2024-01-09 NOTE — Patient Instructions (Addendum)
IMAGING LOCATION: [id number] [address], [place], [place] [address] [phone]   Chronic Back Pain Chronic back pain is back pain that lasts longer than 3 months. The pain may get worse at certain times (flare-ups). There are things you can do at home to manage your pain. Follow these instructions at home: Watch for any changes in your symptoms. Take these actions to help with your pain: Managing pain and stiffness     If told, put ice on the painful area. You may be told to use ice for 24-48 hours after a flare-up starts. Put ice in a plastic bag. Place a towel between your skin and the bag. Leave the ice on for 20 minutes, 2-3 times a day. If told, put heat on the painful area. Do this as often as told by your doctor. Use the heat source that your doctor recommends, such as a moist heat pack or a heating pad. Place a towel between your skin and the heat source. Leave the heat on for 20-30 minutes. If your skin turns bright red, take off the ice or heat right away to prevent skin damage. The risk of damage is higher if you cannot feel pain, heat, or cold. Soak in a warm bath. This can help with pain. Activity        Avoid bending and other activities that make the pain worse. When you stand: Keep your upper back and neck straight. Keep your shoulders pulled back. Avoid slouching. When you sit: Keep your back straight. Relax your shoulders. Do not round your shoulders or pull them backward. Do not sit or stand in one place for too long. Take short rest breaks during the day. Lying down or standing is often better than sitting. Resting can help relieve pain. When sitting or lying down for a long time, do some mild activity or stretching. This will help to prevent stiffness and pain. Get regular exercise. Ask your doctor what activities are safe for you. You may have to avoid lifting. Ask your provider how much you can safely lift. If you lift things: Bend your knees. Keep  the weight close to your body. Avoid twisting. Medicines Take over-the-counter and prescription medicines only as told by your doctor. You may need to take medicines for pain and swelling. These may be taken by mouth or put on the skin. You may also be given muscle relaxants. Ask your doctor if the medicine prescribed to you: Requires you to avoid driving or using machinery. Can cause trouble pooping (constipation). You may need to take these actions to prevent or treat trouble pooping: Drink enough fluid to keep your pee (urine) pale yellow. Take over-the-counter or prescription medicines. Eat foods that are high in fiber. These include beans, whole grains, and fresh fruits and vegetables. Limit foods that are high in fat and sugars. These include fried or sweet foods. General instructions  Sleep on a firm mattress. Try lying on your side with your knees slightly bent. If you lie on your back, put a pillow under your knees. Do not smoke or use any products that contain nicotine or tobacco. If you need help quitting, ask your doctor. Contact a doctor if: Your pain does not get better with rest or medicine. You have new pain. You have a fever. You lose weight quickly. You have trouble doing your normal activities. One or both of your legs or feet feel weak. One or both of your legs or feet lose feeling (have numbness).  Get help right away if: You are not able to control when you pee or poop. You have bad back pain and: You feel like you may vomit (nauseous). You vomit. You have pain in your chest or your belly (abdomen). You have shortness of breath. You faint. These symptoms may be an emergency. Get help right away. Call 911. Do not wait to see if the symptoms will go away. Do not drive yourself to the hospital. This information is not intended to replace advice given to you by your health care provider. Make sure you discuss any questions you have with your health care  provider. Document Revised: 07/23/2022 Document Reviewed: 07/23/2022 Elsevier Patient Education  2024 [employer].

## 2024-01-09 NOTE — Progress Notes (Signed)
BP 129/79 (BP Location: Left Arm, Cuff Size: Normal)   Pulse 88   Temp 98.9 F (37.2 C) (Oral)   Wt 234 lb 3.2 oz (106.2 kg)   SpO2 95%   BMI 32.66 kg/m    Subjective:    Patient ID: Brian Frederick, male    DOB: [birth year], 68 y.o.   MRN: [id number]  HPI: Brian Frederick is a 68 y.o. male  Chief Complaint  Patient presents with   Back Pain    Patient states he has had many issues with his back for many years, but has gotten worse in the last 3 weeks. States pain is around a 5 or 6 when sitting but shoots up to a 8 to 9 with movement. States the pain gets so back sometimes that is makes him nauseous.    BACK PAIN Started about 3 weeks ago with lower back pain and sciatic right leg.  Goes to chiropractor weekly, but no benefit at present.  Last lumbar spin imaging in 2013 noted degenerative disc.  Has underlying PD.  Has had chronic back pain for years. A1c 5.8% in August. Duration: weeks Mechanism of injury: no trauma Location: Right, midline, and low back Onset: sudden Severity: 8/10 Quality: sharp, dull, aching, and throbbing Frequency: constant dull is constant and sharp intermittent Radiation: R leg above the knee Aggravating factors: movement, walking, bending, and prolonged sitting Alleviating factors: rest and laying Status: fluctuating Treatments attempted:  chiropractor, rest, ice, heat, APAP, and ibuprofen  Relief with NSAIDs?: mild Nighttime pain:  no Paresthesias / decreased sensation:  yes right leg to knee Bowel / bladder incontinence:  no Fevers:  no Dysuria / urinary frequency:  no   Relevant past medical, surgical, family and social history reviewed and updated as indicated. Interim medical history since our last visit reviewed. Allergies and medications reviewed and updated.  Review of Systems  Constitutional:  Negative for activity change, diaphoresis, fatigue and fever.  Respiratory:  Negative for cough, chest tightness, shortness of breath and  wheezing.   Cardiovascular:  Negative for chest pain, palpitations and leg swelling.  Gastrointestinal: Negative.   Endocrine: Negative for polydipsia, polyphagia and polyuria.  Musculoskeletal:  Positive for back pain.  Neurological: Negative.   Psychiatric/Behavioral: Negative.     Per HPI unless specifically indicated above     Objective:    BP 129/79 (BP Location: Left Arm, Cuff Size: Normal)   Pulse 88   Temp 98.9 F (37.2 C) (Oral)   Wt 234 lb 3.2 oz (106.2 kg)   SpO2 95%   BMI 32.66 kg/m   Wt Readings from Last 3 Encounters:  01/09/24 234 lb 3.2 oz (106.2 kg)  10/16/23 240 lb 12.8 oz (109.2 kg)  08/12/23 242 lb 3.2 oz (109.9 kg)    Physical Exam Vitals and nursing note reviewed.  Constitutional:      General: He is awake. He is not in acute distress.    Appearance: He is well-developed and well-groomed. He is morbidly obese. He is not ill-appearing.  HENT:     Head: Normocephalic and atraumatic.     Right Ear: Hearing normal. No drainage.     Left Ear: Hearing normal. No drainage.  Eyes:     General: Lids are normal.        Right eye: No discharge.        Left eye: No discharge.     Conjunctiva/sclera: Conjunctivae normal.     Pupils: Pupils are equal, round, and  reactive to light.  Neck:     Thyroid: No thyromegaly.     Vascular: No carotid bruit.  Cardiovascular:     Rate and Rhythm: Normal rate and regular rhythm.     Heart sounds: Normal heart sounds, S1 normal and S2 normal. No murmur heard.    No gallop.  Pulmonary:     Effort: Pulmonary effort is normal. No accessory muscle usage or respiratory distress.     Breath sounds: Normal breath sounds.  Abdominal:     General: Bowel sounds are normal.     Palpations: Abdomen is soft.  Musculoskeletal:     Cervical back: Normal range of motion and neck supple.     Lumbar back: Tenderness present. No swelling, spasms or bony tenderness. Normal range of motion. Positive right straight leg raise test.  Negative left straight leg raise test.     Right lower leg: No edema.     Left lower leg: No edema.     Comments: Able to perform all ROM, however pain present with extension and lateral right + rotation right.  Tender to right lower back. No rashes.  Skin:    General: Skin is warm and dry.     Capillary Refill: Capillary refill takes less than 2 seconds.  Neurological:     Mental Status: He is alert and oriented to person, place, and time.     Motor: Tremor present.  Psychiatric:        Attention and Perception: Attention normal.        Mood and Affect: Mood normal.        Speech: Speech normal.        Behavior: Behavior normal. Behavior is cooperative.        Thought Content: Thought content normal.     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 No red flags. Last imaging 2013 noted degenerative disc present.  Will obtain imaging due to chronic nature of this, would benefit imaging to further assess.  Start steroid taper, recent A1c well below goal, he is aware to monitor sugars with this as it can increase them.  Triamcinolone in office today, 40 MG.  Recommend he start taking Tizanidine, which he has at home and educated on this.  Use Lidocaine patches, which they have at home. Continue current OTC regimen.      Relevant Medications   predniSONE (STERAPRED UNI-PAK 21 TAB) 10 MG (21) TBPK tablet   triamcinolone acetonide (KENALOG-40) injection 40 mg   Other Relevant Orders   DG Lumbar Spine Complete     Follow up plan: Return for as scheduled 02/11/23.

## 2024-01-09 NOTE — Assessment & Plan Note (Signed)
Chronic for years with current acute flare, has not had a flare in years. No red flags. Last imaging 2013 noted degenerative disc present.  Will obtain imaging due to chronic nature of this, would benefit imaging to further assess.  Start steroid taper, recent A1c well below goal, he is aware to monitor sugars with this as it can increase them.  Triamcinolone in office today, 40 MG.  Recommend he start taking Tizanidine, which he has at home and educated on this.  Use Lidocaine patches, which they have at home. Continue current OTC regimen.

## 2024-01-13 ENCOUNTER — Telehealth: Payer: Self-pay | Admitting: Nurse Practitioner

## 2024-01-13 ENCOUNTER — Encounter: Payer: Self-pay | Admitting: Nurse Practitioner

## 2024-01-13 NOTE — Telephone Encounter (Signed)
Called the radiology reading room to have report read ASAP. Routing to provider for result when available.

## 2024-01-13 NOTE — Telephone Encounter (Signed)
Wife came by requesting results of Lumbar Spine from 01-09-24. I am forwarding this message to the provider.

## 2024-01-13 NOTE — Progress Notes (Signed)
Contacted via MyChart   Good afternoon Brian Frederick, your imaging has returned.  I had viewed films as well to check.  You do have some progression of degenerative disc to L levels of spine (lower spine) with progressive narrowing of the disc space.  This would explain current pain level, suspect you may have pinched a nerve.  How are you feeling?  Do you feel need to head to ortho for this pain? Keep being stellar!!  Thank you for allowing me to participate in your care.  I appreciate you. Kindest regards, Mahasin Riviere

## 2024-01-14 ENCOUNTER — Telehealth: Payer: Self-pay | Admitting: Nurse Practitioner

## 2024-01-14 NOTE — Telephone Encounter (Signed)
Called patient offered mychart, vv and in person visit for 01-17-24. Patient wishes to pass and will call back if he needs to schedule.

## 2024-01-14 NOTE — Telephone Encounter (Signed)
Pt. Given imaging results, verbalizes understanding. Agrees with orthopedic referral. Also has sinus symptoms, thick brown sinus drainage. No availability for VV until Thursday. Please advise.

## 2024-01-27 ENCOUNTER — Other Ambulatory Visit: Payer: Self-pay | Admitting: Nurse Practitioner

## 2024-01-28 ENCOUNTER — Ambulatory Visit: Payer: Medicare Other

## 2024-01-28 VITALS — Ht 71.0 in | Wt 235.0 lb

## 2024-01-28 DIAGNOSIS — Z0000 Encounter for general adult medical examination without abnormal findings: Secondary | ICD-10-CM

## 2024-01-28 DIAGNOSIS — H9190 Unspecified hearing loss, unspecified ear: Secondary | ICD-10-CM

## 2024-01-28 NOTE — Patient Instructions (Addendum)
[name] , Thank you for taking time to come for your Medicare Wellness Visit. I appreciate your ongoing commitment to your health goals. Please review the following plan we discussed and let me know if I can assist you in the future.   Referrals/Orders/Follow-Ups/Clinician Recommendations: I have placed a referral to ENT to evaluate your hearing. Someone from their office will call you to schedule an appointment.  This is a list of the screening recommended for you and due dates:  Health Maintenance  Topic Date Due   COVID-19 Vaccine (6 - 2024-25 season) 12/15/2023   Yearly kidney health urinalysis for diabetes  02/09/2024   Hemoglobin A1C  02/12/2024   Yearly kidney function blood test for diabetes  08/11/2024   Complete foot exam   08/11/2024   Eye exam for diabetics  08/28/2024   Medicare Annual Wellness Visit  01/27/2025   Colon Cancer Screening  09/25/2029   DTaP/Tdap/Td vaccine (4 - Td or Tdap) 08/11/2033   Pneumonia Vaccine  Completed   Flu Shot  Completed   Hepatitis C Screening  Completed   Zoster (Shingles) Vaccine  Completed   HPV Vaccine  Aged Out    Advanced directives: (Copy Requested) Please bring a copy of your health care power of attorney and living will to the office to be added to your chart at your convenience.  Next Medicare Annual Wellness Visit scheduled for next year: Yes, 02/02/25 @ 10:40am (phone visit)

## 2024-01-28 NOTE — Progress Notes (Signed)
Subjective:   Brian Frederick is a 68 y.o. male who presents for Medicare Annual/Subsequent preventive examination.  This patient declined Interactive audio and Acupuncturist. Therefore the visit was completed with audio only.   Visit Complete: Virtual I connected with  Brian Frederick on 01/28/24 by a audio enabled telemedicine application and verified that I am speaking with the correct person using two identifiers.  Patient Location: Home  Provider Location: Home Office  I discussed the limitations of evaluation and management by telemedicine. The patient expressed understanding and agreed to proceed.  Vital Signs: Because this visit was a virtual/telehealth visit, some criteria may be missing or patient reported. Any vitals not documented were not able to be obtained and vitals that have been documented are patient reported.   Cardiac Risk Factors include: advanced age (>40men, >63 women);male gender;diabetes mellitus;dyslipidemia;obesity (BMI >30kg/m2)     Objective:    Today's Vitals   01/28/24 1036  Weight: 235 lb (106.6 kg)  Height: 5\' 11"  (1.803 m)  PainSc: 2    Body mass index is 32.78 kg/m.     01/28/2024   10:49 AM 10/16/2023   10:57 AM 04/03/2023   11:02 AM 01/22/2023    3:35 PM 10/16/2022   10:56 AM 09/25/2022    7:52 AM 04/12/2022   10:55 AM  Advanced Directives  Does Patient Have a Medical Advance Directive? Yes Yes Yes No Yes Yes Yes  Type of Estate agent of [place];Living will Living will Healthcare Power of [place];Living will  Living will Healthcare Power of [place];Living will Living will  Does patient want to make changes to medical advance directive? No - Patient declined        Copy of Healthcare Power of Attorney in Chart? No - copy requested     No - copy requested   Would patient like information on creating a medical advance directive?    No - Patient declined       Current Medications (verified) Outpatient  Encounter Medications as of 01/28/2024  Medication Sig   atorvastatin (LIPITOR) 20 MG tablet Take 1 tablet (20 mg total) by mouth daily.   BD PEN NEEDLE NANO 2ND GEN 32G X 4 MM MISC USE 1 PEN NEEDLE EVERY MORNING   blood glucose meter kit and supplies KIT Dispense based on patient and insurance preference. Use up to four times daily as directed. (FOR ICD-9 250.00, 250.01).   Carbidopa-Levodopa ER (RYTARY) 48.75-195 MG CPCR Take 2 at 8am, 2 at 1pm, 1 at 6pm   escitalopram (LEXAPRO) 10 MG tablet Take 1 tablet (10 mg total) by mouth daily.   gabapentin (NEURONTIN) 100 MG capsule TAKE 2 CAPSULE (200 MG) BY MOUTH TWICE A DAY.   lansoprazole (PREVACID) 30 MG capsule TAKE ONE CAPSULE BY MOUTH DAILY AT 12 NOON   lisinopril (ZESTRIL) 2.5 MG tablet Take 1 tablet (2.5 mg total) by mouth daily.   meloxicam (MOBIC) 15 MG tablet Take 1 tablet (15 mg total) by mouth daily as needed for pain.   metFORMIN (GLUCOPHAGE-XR) 500 MG 24 hr tablet Take 1 tablet (500 mg total) by mouth at bedtime.   metoprolol succinate (TOPROL-XL) 100 MG 24 hr tablet TAKE 1 TABLET BY MOUTH EVERY DAY WITH FOOD OR IMMEDIATELY AFTER A MEAL   omega-3 acid ethyl esters (LOVAZA) 1 g capsule Take 1 capsule (1 g total) by mouth 2 (two) times daily.   ONETOUCH VERIO test strip USE TO TEST BLOOD SUGAR UP TO FOUR TIMES DAILY  AS DIRECTED   sildenafil (VIAGRA) 100 MG tablet Take 0.5-1 tablets (50-100 mg total) by mouth daily as needed for erectile dysfunction.   tiZANidine (ZANAFLEX) 4 MG tablet Take 1 tablet by mouth at bedtime as needed.   trihexyphenidyl (ARTANE) 2 MG tablet TAKE 1 TABLET(2 MG) BY MOUTH THREE TIMES DAILY WITH MEALS   TRULICITY 3 MG/0.5ML SOAJ INJECT 3 MG (0.5 ML) UNDER THE SKIN ONCE A WEEK   predniSONE (STERAPRED UNI-PAK 21 TAB) 10 MG (21) TBPK tablet Take as instructed on package. (Patient not taking: Reported on 01/28/2024)   No facility-administered encounter medications on file as of 01/28/2024.    Allergies  (verified) Amoxil [amoxicillin] and Penicillin g benzathine   History: Past Medical History:  Diagnosis Date   Achilles tendon disorder    Anxiety    Basal cell carcinoma 06/23/2008   Mid dorsum nose. Ulcerated.   BPH (benign prostatic hypertrophy)    Cancer (HCC)    basal cell   Chronic kidney disease    Dysrhythmia    GERD (gastroesophageal reflux disease)    Hyperlipidemia    Hypertension    Hypogonadism in male    Osteoarthritis    Prediabetes    Squamous cell carcinoma of skin 10/27/2013   Right distal lateral thigh. SCCis   SVT (supraventricular tachycardia) (HCC)    Past Surgical History:  Procedure Laterality Date   BASAL CELL CARCINOMA EXCISION     CARDIAC ELECTROPHYSIOLOGY MAPPING AND ABLATION     65yrs ago   cardio oblation     CHOLECYSTECTOMY     COLONOSCOPY WITH PROPOFOL N/A 04/12/2017   Procedure: COLONOSCOPY WITH PROPOFOL;  Surgeon: Midge Minium, MD;  Location: [facility];  Service: Endoscopy;  Laterality: N/A;   COLONOSCOPY WITH PROPOFOL N/A 09/25/2022   Procedure: COLONOSCOPY WITH PROPOFOL;  Surgeon: Midge Minium, MD;  Location: [facility];  Service: Endoscopy;  Laterality: N/A;   JOINT REPLACEMENT Left    shoulder   JOINT REPLACEMENT Right    shoulder   POLYPECTOMY N/A 04/12/2017   Procedure: POLYPECTOMY;  Surgeon: Midge Minium, MD;  Location: [facility];  Service: Endoscopy;  Laterality: N/A;   SHOULDER SURGERY Right 11/28/2018   Family History  Problem Relation Age of Onset   Cancer Mother        breast   Heart disease Father 45       CABG   Diabetes Father    CAD Father    Hyperlipidemia Father    Hypertension Father    Cancer Sister        breast   Liver cancer Sister    Hypothyroidism Sister    Stroke Maternal Grandfather    Diabetes Paternal Grandmother    Social History   Socioeconomic History   Marital status: Married    Spouse name: Olegario Messier   Number of children: 3   Years of education: Not on file    Highest education level: Associate degree: occupational, Scientist, product/process development, or vocational program  Occupational History   Occupation: retired    Comment: Curator  Tobacco Use   Smoking status: Never   Smokeless tobacco: Never  Vaping Use   Vaping status: Never Used  Substance and Sexual Activity   Alcohol use: No    Alcohol/week: 0.0 standard drinks of alcohol   Drug use: No   Sexual activity: Yes  Other Topics Concern   Not on file  Social History Narrative   Married lives with spouse   Has 3 kids biological 1  step child   Right handed   Drinks coffee 2 cups a day, sweet tea with meals, 2-3 soda's a day   Live in one story   Social Drivers of Health   Financial Resource Strain: Low Risk  (01/28/2024)   Overall Financial Resource Strain (CARDIA)    Difficulty of Paying Living Expenses: Not hard at all  Food Insecurity: No Food Insecurity (01/28/2024)   Hunger Vital Sign    Worried About Running Out of Food in the Last Year: Never true    Ran Out of Food in the Last Year: Never true  Transportation Needs: No Transportation Needs (01/28/2024)   PRAPARE - Administrator, Civil Service (Medical): No    Lack of Transportation (Non-Medical): No  Physical Activity: Inactive (01/28/2024)   Exercise Vital Sign    Days of Exercise per Week: 0 days    Minutes of Exercise per Session: 0 min  Stress: No Stress Concern Present (01/28/2024)   [employer] of Occupational Health - Occupational Stress Questionnaire    Feeling of Stress : Not at all  Social Connections: Moderately Isolated (01/28/2024)   Social Connection and Isolation Panel [NHANES]    Frequency of Communication with Friends and Family: Three times a week    Frequency of Social Gatherings with Friends and Family: Once a week    Attends Religious Services: Never    Database administrator or Organizations: No    Attends Engineer, structural: Never    Marital Status: Married    Tobacco  Counseling Counseling given: Not Answered   Clinical Intake:  Pre-visit preparation completed: Yes  Pain : 0-10 Pain Score: 2  Pain Type: Chronic pain Pain Location: Back Pain Descriptors / Indicators: Aching     BMI - recorded: 32.78 Nutritional Status: BMI > 30  Obese Diabetes: Yes CBG done?: No (FBS 108 per patient) Did pt. bring in CBG monitor from home?: No  How often do you need to have someone help you when you read instructions, pamphlets, or other written materials from your doctor or pharmacy?: 1 - Never  Interpreter Needed?: No  Information entered by :: Tora Kindred, CMA   Activities of Daily Living    01/28/2024   10:40 AM 08/12/2023    8:15 AM  In your present state of health, do you have any difficulty performing the following activities:  Hearing? 1 0  Comment hearing loss, referred to ENT   Vision? 0 0  Difficulty concentrating or making decisions? 0 0  Walking or climbing stairs? 0 0  Dressing or bathing? 0 0  Doing errands, shopping? 0 0  Preparing Food and eating ? N   Using the Toilet? N   In the past six months, have you accidently leaked urine? N   Do you have problems with loss of bowel control? N   Managing your Medications? N   Managing your Finances? N   Housekeeping or managing your Housekeeping? N     Patient Care Team: Marjie Skiff, NP as PCP - General (Nurse Practitioner) Tat, Octaviano Batty, DO as Consulting Physician (Neurology) Dahlia Byes, [facility] (Inactive) (Pharmacist) Pa, [facility] (Optometry)  Indicate any recent Medical Services you may have received from other than [facility] providers in the past year (date may be approximate).     Assessment:   This is a routine wellness examination for Brian Frederick.  Hearing/Vision screen Hearing Screening - Comments:: Hearing loss, referral to ENT placed Vision Screening -  Comments:: Gets eye exams, [facility]   Goals Addressed               This Visit's  Progress     Patient Stated (pt-stated)        Feel good enough to do the things I want to do      Depression Screen    01/28/2024   10:47 AM 01/09/2024    9:41 AM 08/12/2023    8:14 AM 01/22/2023    3:33 PM 08/08/2022    9:32 AM 02/07/2022   10:16 AM 12/13/2021   11:28 AM  PHQ 2/9 Scores  PHQ - 2 Score 1 1 1  0 0 0 0  PHQ- 9 Score 1 2 2  0 1 2 0    Fall Risk    01/28/2024   10:50 AM 01/09/2024    9:41 AM 10/16/2023   10:57 AM 08/12/2023    8:15 AM 04/03/2023   11:02 AM  Fall Risk   Falls in the past year? 0 0 0 0 1  Number falls in past yr: 0 0 0 0 0  Injury with Fall? 0 0 0 0 0  Risk for fall due to : No Fall Risks No Fall Risks  No Fall Risks   Follow up Falls prevention discussed;Falls evaluation completed Falls evaluation completed   Falls evaluation completed    MEDICARE RISK AT HOME: Medicare Risk at Home Any stairs in or around the home?: Yes If so, are there any without handrails?: No Home free of loose throw rugs in walkways, pet beds, electrical cords, etc?: Yes Adequate lighting in your home to reduce risk of falls?: Yes Life alert?: No Use of a cane, walker or w/c?: No Grab bars in the bathroom?: Yes Shower chair or bench in shower?: Yes Elevated toilet seat or a handicapped toilet?: No  TIMED UP AND GO:  Was the test performed?  No    Cognitive Function:        01/28/2024   10:53 AM 01/22/2023    3:43 PM  6CIT Screen  What Year? 0 points 0 points  What month? 0 points 0 points  What time? 0 points 0 points  Count back from 20 2 points 0 points  Months in reverse 0 points 2 points  Repeat phrase 0 points 0 points  Total Score 2 points 2 points    Immunizations Immunization History  Administered Date(s) Administered   Fluad Quad(high Dose 65+) 11/07/2021   Fluad Trivalent(High Dose 65+) 10/15/2023   Influenza, High Dose Seasonal PF 09/18/2022   Influenza,inj,Quad PF,6+ Mos 09/23/2015, 09/25/2016, 09/29/2017, 09/28/2018, 09/21/2019, 09/28/2020    Influenza-Unspecified 09/29/2017, 09/28/2018   Moderna Covid-19 Fall Seasonal Vaccine 3yrs & older 09/18/2022, 10/20/2023   PFIZER(Purple Top)SARS-COV-2 Vaccination 03/16/2020, 04/06/2020, 10/21/2020   PNEUMOCOCCAL CONJUGATE-20 09/18/2022   Pneumococcal Conjugate-13 01/11/2021   Pneumococcal Polysaccharide-23 08/26/2013, 02/07/2022   Rsv, Bivalent, Protein Subunit Rsvpref,pf Verdis Frederickson) 09/18/2022   Td 08/19/2002, 08/12/2023   Tdap 11/05/2012   Unspecified SARS-COV-2 Vaccination 09/18/2022   Zoster Recombinant(Shingrix) 09/29/2017, 12/08/2017   Zoster, Live 03/23/2016    TDAP status: Up to date  Flu Vaccine status: Up to date  Pneumococcal vaccine status: Up to date  Covid-19 vaccine status: Completed vaccines  Qualifies for Shingles Vaccine? Yes   Zostavax completed Yes   Shingrix Completed?: Yes  Screening Tests Health Maintenance  Topic Date Due   COVID-19 Vaccine (6 - 2024-25 season) 12/15/2023   Diabetic kidney evaluation - Urine ACR  02/09/2024   HEMOGLOBIN A1C  02/12/2024   Diabetic kidney evaluation - eGFR measurement  08/11/2024   FOOT EXAM  08/11/2024   OPHTHALMOLOGY EXAM  08/28/2024   Medicare Annual Wellness (AWV)  01/27/2025   Colonoscopy  09/25/2029   DTaP/Tdap/Td (4 - Td or Tdap) 08/11/2033   Pneumonia Vaccine 35+ Years old  Completed   INFLUENZA VACCINE  Completed   Hepatitis C Screening  Completed   Zoster Vaccines- Shingrix  Completed   HPV VACCINES  Aged Out    Health Maintenance  Health Maintenance Due  Topic Date Due   COVID-19 Vaccine (6 - 2024-25 season) 12/15/2023   Diabetic kidney evaluation - Urine ACR  02/09/2024    Colorectal cancer screening: Type of screening: Colonoscopy. Completed 09/25/22. Repeat every 7 years  Lung Cancer Screening: (Low Dose CT Chest recommended if Age 55-80 years, 20 pack-year currently smoking OR have quit w/in 15years.) does not qualify.   Lung Cancer Screening Referral: n/a  Additional  Screening:  Hepatitis C Screening: does not qualify; Completed 03/23/16  Vision Screening: Recommended annual ophthalmology exams for early detection of glaucoma and other disorders of the eye. Is the patient up to date with their annual eye exam?  Yes  Who is the provider or what is the name of the office in which the patient attends annual eye exams? [facility] [place] If pt is not established with a provider, would they like to be referred to a provider to establish care? No .   Dental Screening: Recommended annual dental exams for proper oral hygiene  Diabetic Foot Exam: Diabetic Foot Exam: Completed 08/12/23  Community Resource Referral / Chronic Care Management: CRR required this visit?  No   CCM required this visit?  No     Plan:     I have personally reviewed and noted the following in the patient's chart:   Medical and social history Use of alcohol, tobacco or illicit drugs  Current medications and supplements including opioid prescriptions. Patient is not currently taking opioid prescriptions. Functional ability and status Nutritional status Physical activity Advanced directives List of other physicians Hospitalizations, surgeries, and ER visits in previous 12 months Vitals Screenings to include cognitive, depression, and falls Referrals and appointments  In addition, I have reviewed and discussed with patient certain preventive protocols, quality metrics, and best practice recommendations. A written personalized care plan for preventive services as well as general preventive health recommendations were provided to patient.     Zian Delair, CMA   01/28/2024   After Visit Summary: (MyChart) Due to this being a telephonic visit, the after visit summary with patients personalized plan was offered to patient via MyChart   Nurse Notes:  6 CIT Score - 2 Placed referral to ENT to evaluate hearing loss Declined DM & Nutrition education

## 2024-01-28 NOTE — Telephone Encounter (Signed)
Requested Prescriptions  Pending Prescriptions Disp Refills   TRULICITY 3 MG/0.5ML SOAJ [Pharmacy Med Name: Trulicity Subcutaneous Solution Auto-injector 3 MG/0.5ML] 6 mL 0    Sig: INJECT 3 MG (0.5 ML) UNDER THE SKIN ONCE A WEEK     Endocrinology:  Diabetes - GLP-1 Receptor Agonists Passed - 01/28/2024  8:55 AM      Passed - HBA1C is between 0 and 7.9 and within 180 days    HB A1C (BAYER DCA - WAIVED)  Date Value Ref Range Status  08/12/2023 5.8 (H) 4.8 - 5.6 % Final    Comment:             Prediabetes: 5.7 - 6.4          Diabetes: >6.4          Glycemic control for adults with diabetes: <7.0          Passed - Valid encounter within last 6 months    Recent Outpatient Visits           2 weeks ago Chronic bilateral low back pain with right-sided sciatica   [place] [facility] [place], [place] T, NP   5 months ago Type 2 diabetes mellitus with obesity (HCC)   [place] [facility] [place], Selma T, NP   11 months ago Type 2 diabetes mellitus with obesity (HCC)   [place] [facility] [place], Corrie Dandy T, NP   1 year ago Type 2 diabetes mellitus with obesity (HCC)   [place] [facility] [place], Corrie Dandy T, NP   1 year ago Type 2 diabetes mellitus with obesity (HCC)   [place] [facility] [place], Dorie Rank, NP       Future Appointments             In 2 weeks Marjie Skiff, NP  [place] [facility]   In 3 months Deirdre Evener, MD [facility]

## 2024-01-29 ENCOUNTER — Telehealth: Payer: Self-pay

## 2024-01-29 ENCOUNTER — Other Ambulatory Visit: Payer: Self-pay

## 2024-01-29 NOTE — Telephone Encounter (Signed)
Received a fax from [facility] stating that they received the referral from our office but the note received with the referral were for back pain. Referral was placed by nurse health advisor from yesterday's medicare wellness visit with her. Can we resend her note for the referral please?

## 2024-01-29 NOTE — Telephone Encounter (Signed)
ERROR

## 2024-01-30 ENCOUNTER — Telehealth: Payer: Self-pay | Admitting: Nurse Practitioner

## 2024-01-30 NOTE — Telephone Encounter (Signed)
Question regarding referral for this patient.  Referring for hearing disorder but no notes stating that patient has a hearing disorder. Would like to speak with someone regarding this matter and to clarify why he is needing this done. Please call, [phone], Inetta Fermo.

## 2024-01-31 NOTE — Telephone Encounter (Signed)
Richarda Blade,   Can you assist with this referral you put in. Everything I see doesn't show hearing loss till his virtual with you.  Thanks, Florentina Addison, Lead CMA

## 2024-02-09 DIAGNOSIS — E119 Type 2 diabetes mellitus without complications: Secondary | ICD-10-CM | POA: Insufficient documentation

## 2024-02-09 NOTE — Patient Instructions (Signed)
 Be Involved in Caring For Your Health:  Taking Medications When medications are taken as directed, they can greatly improve your health. But if they are not taken as prescribed, they may not work. In some cases, not taking them correctly can be harmful. To help ensure your treatment remains effective and safe, understand your medications and how to take them. Bring your medications to each visit for review by your provider.  Your lab results, notes, and after visit summary will be available on My Chart. We strongly encourage you to use this feature. If lab results are abnormal the clinic will contact you with the appropriate steps. If the clinic does not contact you assume the results are satisfactory. You can always view your results on My Chart. If you have questions regarding your health or results, please contact the clinic during office hours. You can also ask questions on My Chart.  We at [facility] are grateful that you chose [place] to provide your care. We strive to provide evidence-based and compassionate care and are always looking for feedback. If you get a survey from the clinic please complete this so we can hear your opinions.  Diabetes Mellitus and Foot Care Diabetes, also called diabetes mellitus, may cause problems with your feet and legs because of poor blood flow (circulation). Poor circulation may make your skin: Become thinner and drier. Break more easily. Heal more slowly. Peel and crack. You may also have nerve damage (neuropathy). This can cause decreased feeling in your legs and feet. This means that you may not notice minor injuries to your feet that could lead to more serious problems. Finding and treating problems early is the best way to prevent future foot problems. How to care for your feet Foot hygiene  Wash your feet daily with warm water and mild soap. Do not use hot water. Then, pat your feet and the areas between your toes until they are fully dry. Do  not soak your feet. This can dry your skin. Trim your toenails straight across. Do not dig under them or around the cuticle. File the edges of your nails with an emery board or nail file. Apply a moisturizing lotion or petroleum jelly to the skin on your feet and to dry, brittle toenails. Use lotion that does not contain alcohol and is unscented. Do not apply lotion between your toes. Shoes and socks Wear clean socks or stockings every day. Make sure they are not too tight. Do not wear knee-high stockings. These may decrease blood flow to your legs. Wear shoes that fit well and have enough cushioning. Always look in your shoes before you put them on to be sure there are no objects inside. To break in new shoes, wear them for just a few hours a day. This prevents injuries on your feet. Wounds, scrapes, corns, and calluses  Check your feet daily for blisters, cuts, bruises, sores, and redness. If you cannot see the bottom of your feet, use a mirror or ask someone for help. Do not cut off corns or calluses or try to remove them with medicine. If you find a minor scrape, cut, or break in the skin on your feet, keep it and the skin around it clean and dry. You may clean these areas with mild soap and water. Do not clean the area with peroxide, alcohol, or iodine. If you have a wound, scrape, corn, or callus on your foot, look at it several times a day to make sure it  is healing and not infected. Check for: Redness, swelling, or pain. Fluid or blood. Warmth. Pus or a bad smell. General tips Do not cross your legs. This may decrease blood flow to your feet. Do not use heating pads or hot water bottles on your feet. They may burn your skin. If you have lost feeling in your feet or legs, you may not know this is happening until it is too late. Protect your feet from hot and cold by wearing shoes, such as at the beach or on hot pavement. Schedule a complete foot exam at least once a year or more often if  you have foot problems. Report any cuts, sores, or bruises to your health care provider right away. Where to find more information American Diabetes Association: diabetes.org Association of Diabetes Care & Education Specialists: diabeteseducator.org Contact a health care provider if: You have a condition that increases your risk of infection, and you have any cuts, sores, or bruises on your feet. You have an injury that is not healing. You have redness on your legs or feet. You feel burning or tingling in your legs or feet. You have pain or cramps in your legs and feet. Your legs or feet are numb. Your feet always feel cold. You have pain around any toenails. Get help right away if: You have a wound, scrape, corn, or callus on your foot and: You have signs of infection. You have a fever. You have a red line going up your leg. This information is not intended to replace advice given to you by your health care provider. Make sure you discuss any questions you have with your health care provider. Document Revised: 06/06/2022 Document Reviewed: 06/06/2022 Elsevier Patient Education  2024 [employer].

## 2024-02-12 ENCOUNTER — Encounter: Payer: Self-pay | Admitting: Nurse Practitioner

## 2024-02-12 ENCOUNTER — Ambulatory Visit (INDEPENDENT_AMBULATORY_CARE_PROVIDER_SITE_OTHER): Payer: Medicare Other | Admitting: Nurse Practitioner

## 2024-02-12 VITALS — BP 117/75 | HR 76 | Temp 98.5°F | Ht 70.8 in | Wt 230.2 lb

## 2024-02-12 DIAGNOSIS — G20A1 Parkinson's disease without dyskinesia, without mention of fluctuations: Secondary | ICD-10-CM

## 2024-02-12 DIAGNOSIS — E785 Hyperlipidemia, unspecified: Secondary | ICD-10-CM | POA: Diagnosis not present

## 2024-02-12 DIAGNOSIS — E119 Type 2 diabetes mellitus without complications: Secondary | ICD-10-CM | POA: Diagnosis not present

## 2024-02-12 DIAGNOSIS — E669 Obesity, unspecified: Secondary | ICD-10-CM | POA: Diagnosis not present

## 2024-02-12 DIAGNOSIS — Z7985 Long-term (current) use of injectable non-insulin antidiabetic drugs: Secondary | ICD-10-CM | POA: Diagnosis not present

## 2024-02-12 DIAGNOSIS — E538 Deficiency of other specified B group vitamins: Secondary | ICD-10-CM

## 2024-02-12 DIAGNOSIS — F419 Anxiety disorder, unspecified: Secondary | ICD-10-CM

## 2024-02-12 DIAGNOSIS — E1159 Type 2 diabetes mellitus with other circulatory complications: Secondary | ICD-10-CM | POA: Diagnosis not present

## 2024-02-12 DIAGNOSIS — E6609 Other obesity due to excess calories: Secondary | ICD-10-CM

## 2024-02-12 DIAGNOSIS — Z6833 Body mass index (BMI) 33.0-33.9, adult: Secondary | ICD-10-CM

## 2024-02-12 DIAGNOSIS — E1169 Type 2 diabetes mellitus with other specified complication: Secondary | ICD-10-CM | POA: Diagnosis not present

## 2024-02-12 DIAGNOSIS — H9193 Unspecified hearing loss, bilateral: Secondary | ICD-10-CM

## 2024-02-12 DIAGNOSIS — E66811 Obesity, class 1: Secondary | ICD-10-CM

## 2024-02-12 LAB — MICROALBUMIN, URINE WAIVED
Creatinine, Urine Waived: 100 mg/dL (ref 10–300)
Microalb, Ur Waived: 30 mg/L — ABNORMAL HIGH (ref 0–19)
Microalb/Creat Ratio: <30 mg/g (ref <30)

## 2024-02-12 LAB — BAYER DCA HB A1C WAIVED: HB A1C (BAYER DCA - WAIVED): 5.9 % — ABNORMAL HIGH (ref 4.8–5.6)

## 2024-02-12 NOTE — Assessment & Plan Note (Addendum)
 Chronic, ongoing. A1c 5.9% today. Continue Trulicity 3 mg and Metformin XR 500 mg daily as needed for diabetes control and continued weight loss support. Check BS daily before breakfast and document. Continue to focus on diet. Follow up in 6 months.  -Eye exam scheduled for 07/2024 -Foot exam up to date -Vaccines up to date - Statin and ACE on board

## 2024-02-12 NOTE — Progress Notes (Signed)
 BP 117/75   Pulse 76   Temp 98.5 F (36.9 C) (Oral)   Ht 5' 10.8" (1.798 m)   Wt 230 lb 3.2 oz (104.4 kg)   SpO2 96%   BMI 32.29 kg/m    Subjective:    Patient ID: Brian Frederick, male    DOB: 11-May-1956, 68 y.o.   MRN: [id number]  HPI: Brian Frederick is a 68 y.o. male  Chief Complaint  Patient presents with   Diabetes   Hyperlipidemia   Hypertension   NOTE WRITTEN BY DNP STUDENT.  ASSESSMENT AND PLAN OF CARE REVIEWED WITH STUDENT, AGREE WITH ABOVE FINDINGS AND PLAN.  PARKINSON"S DISEASE: Diagnosed in 2015.  Sees Dr. Arbutus Leas. Denies any recent falls or decline in function.  Saw Dr. Arbutus Leas with neurology last on 10/16/23, they increased Rytary. Reports continues to have tremor to left hand.  Goes to dermatology regularly, last 02/21/23.  DIABETES A1c August 5.8%.  Continues Metformin only if needed and Trulicity 3 MG (gets with assistance).  Taking Gabapentin 200 MG BID for neuropathy. B12 taken occasionally.   Hypoglycemic episodes:no Polydipsia/polyuria: no Visual disturbance: no Chest pain: no Paresthesias: no Glucose Monitoring: yes  Accucheck frequency: Daily  Fasting glucose:  Post prandial:  Evening:  Before meals: Taking Insulin?: no  Long acting insulin:  Short acting insulin: Blood Pressure Monitoring: a few times a month Retinal Examination: Up to Date Foot Exam: Up to Date Diabetic Education: Completed Pneumovax: Up to Date Influenza: Up to Date Aspirin: no   HYPERTENSION / HYPERLIPIDEMIA Taking Lisinopril 5 MG daily, Metoprolol 100 MG daily, Lipitor 20 MG, Lovaza + ASA.   Satisfied with current treatment? yes Duration of hypertension: years BP monitoring frequency: a few times a month BP range:  BP medication side effects: no Duration of hyperlipidemia: years Cholesterol medication side effects: no Cholesterol supplements: fish oil Medication compliance: good compliance Aspirin: no Recent stressors: no Recurrent headaches: no Visual changes:  no Palpitations: no Dyspnea: no Chest pain: no Lower extremity edema: no Dizzy/lightheaded: no   ANXIETY Taking Lexapro 10 MG daily. Mood status: stable Satisfied with current treatment?: yes Symptom severity: mild  Duration of current treatment : years Side effects: no Medication compliance: good compliance Psychotherapy/counseling: no    Depressed mood: no Anxious mood: no Anhedonia: no Significant weight loss or gain: no Insomnia: no    Fatigue: no Feelings of worthlessness or guilt: no Impaired concentration/indecisiveness: no Suicidal ideations: no Hopelessness: no Crying spells: no    01/28/2024   10:47 AM 01/09/2024    9:41 AM 08/12/2023    8:14 AM 01/22/2023    3:33 PM 08/08/2022    9:32 AM  Depression screen PHQ 2/9  Decreased Interest 1 0 1 0 0  Down, Depressed, Hopeless 0 1 0 0 0  PHQ - 2 Score 1 1 1  0 0  Altered sleeping 0 1 0 0 0  Tired, decreased energy 0 0 1 0 1  Change in appetite 0 0 0 0 0  Feeling bad or failure about yourself  0 0 0 0 0  Trouble concentrating 0 0 0 0 0  Moving slowly or fidgety/restless 0 0 0 0 0  Suicidal thoughts 0 0 0 0 0  PHQ-9 Score 1 2 2  0 1  Difficult doing work/chores Not difficult at all Not difficult at all Not difficult at all Not difficult at all Not difficult at all       01/09/2024    9:41 AM 08/12/2023  8:15 AM 02/08/2023    9:04 AM 08/08/2022    9:33 AM  GAD 7 : Generalized Anxiety Score  Nervous, Anxious, on Edge 0 0 0 0  Control/stop worrying 0 0 0 0  Worry too much - different things 0 0 0 0  Trouble relaxing 0 0 0 0  Restless 0 0 0 0  Easily annoyed or irritable 0 0 0 0  Afraid - awful might happen 0 0 0 0  Total GAD 7 Score 0 0 0 0  Anxiety Difficulty Not difficult at all Not difficult at all  Not difficult at all   Relevant past medical, surgical, family and social history reviewed and updated as indicated. Interim medical history since our last visit reviewed. Allergies and medications reviewed and  updated.  Review of Systems  Constitutional:  Negative for activity change, appetite change and fatigue.  HENT:  Negative for sinus pressure and sinus pain.   Respiratory:  Negative for shortness of breath and wheezing.   Cardiovascular:  Negative for chest pain and leg swelling.  Gastrointestinal:  Negative for constipation, diarrhea, nausea and vomiting.  Endocrine: Negative for cold intolerance and heat intolerance.  Genitourinary:  Negative for difficulty urinating, frequency and urgency.  Musculoskeletal:  Negative for back pain and joint swelling.  Skin:  Negative for rash and wound.  Neurological:  Positive for tremors. Negative for dizziness and headaches.  Hematological:  Does not bruise/bleed easily.  Psychiatric/Behavioral:  Negative for agitation and suicidal ideas. The patient is not nervous/anxious.     Per HPI unless specifically indicated above     Objective:    BP 117/75   Pulse 76   Temp 98.5 F (36.9 C) (Oral)   Ht 5' 10.8" (1.798 m)   Wt 230 lb 3.2 oz (104.4 kg)   SpO2 96%   BMI 32.29 kg/m   Wt Readings from Last 3 Encounters:  02/12/24 230 lb 3.2 oz (104.4 kg)  01/28/24 235 lb (106.6 kg)  01/09/24 234 lb 3.2 oz (106.2 kg)    Physical Exam Vitals and nursing note reviewed.  Constitutional:      General: He is not in acute distress.    Appearance: Normal appearance. He is well-developed and well-groomed. He is not ill-appearing.  Neck:     Thyroid: No thyroid mass.     Vascular: No carotid bruit.     Trachea: Trachea normal.  Cardiovascular:     Rate and Rhythm: Normal rate and regular rhythm.     Heart sounds: Normal heart sounds. No murmur heard. Pulmonary:     Effort: Pulmonary effort is normal.     Breath sounds: Normal breath sounds. No wheezing.  Abdominal:     General: Bowel sounds are normal. There is no distension.     Palpations: Abdomen is soft.     Tenderness: There is no abdominal tenderness.  Musculoskeletal:        General: No  swelling. Normal range of motion.     Cervical back: Full passive range of motion without pain, normal range of motion and neck supple. No tenderness.     Right lower leg: No edema.     Left lower leg: No edema.  Lymphadenopathy:     Cervical: No cervical adenopathy.     Right cervical: No superficial cervical adenopathy.    Left cervical: No superficial cervical adenopathy.  Skin:    General: Skin is warm and dry.     Findings: No bruising, rash or wound.  Neurological:     General: No focal deficit present.     Mental Status: He is alert and oriented to person, place, and time. Mental status is at baseline.     Cranial Nerves: Cranial nerves 2-12 are intact.     Motor: Tremor present.     Deep Tendon Reflexes: Reflexes normal.  Psychiatric:        Attention and Perception: Attention and perception normal.        Mood and Affect: Mood and affect normal.        Speech: Speech normal.        Behavior: Behavior normal. Behavior is cooperative.        Thought Content: Thought content normal.        Cognition and Memory: Cognition and memory normal.        Judgment: Judgment normal.    Results for orders placed or performed in visit on 02/12/24  Bayer DCA Hb A1c Waived   Collection Time: 02/12/24 10:07 AM  Result Value Ref Range   HB A1C (BAYER DCA - WAIVED) 5.9 (H) 4.8 - 5.6 %  Microalbumin, Urine Waived   Collection Time: 02/12/24 10:07 AM  Result Value Ref Range   Microalb, Ur Waived 30 (H) 0 - 19 mg/L   Creatinine, Urine Waived 100 10 - 300 mg/dL   Microalb/Creat Ratio <30 <30 mg/g      Assessment & Plan:   Problem List Items Addressed This Visit       Cardiovascular and Mediastinum   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HCC)   Chronic, ongoing. A1c 5.9% today. Continue Trulicity 3 mg and Metformin XR 500 mg daily as needed for diabetes control and continued weight loss support. Check BS daily before breakfast and document. Continue to focus on diet.  Follow up in 6 months.  -Eye exam scheduled for 07/2024 -Foot exam up to date -Vaccines up to date - Statin and ACE on board       Relevant Orders   Bayer DCA Hb A1c Waived (Completed)   Comprehensive metabolic panel     Endocrine   Diabetes mellitus treated with injections of non-insulin medication (HCC)   Refer to diabetes with obesity plan of care.      Relevant Orders   Bayer DCA Hb A1c Waived (Completed)   Microalbumin, Urine Waived (Completed)   Comprehensive metabolic panel   Hyperlipidemia associated with type 2 diabetes mellitus (HCC)   Chronic, ongoing. Continue current medication regimen and adjust as needed. Follow up in 6 months.      Relevant Orders   Bayer DCA Hb A1c Waived (Completed)   Comprehensive metabolic panel   Lipid Panel w/o Chol/HDL Ratio   Type 2 diabetes mellitus with obesity (HCC) - Primary   Chronic, ongoing. A1c 5.9% today. Continue Trulicity 3 mg and Metformin XR 500 mg daily as needed for diabetes control and continued weight loss support. Check BS daily before breakfast and document. Continue to focus on diet. Follow up in 6 months.  -Eye exam scheduled for 07/2024 -Foot exam up to date -Vaccines up to date - Statin and ACE on board      Relevant Orders   Bayer DCA Hb A1c Waived (Completed)   Microalbumin, Urine Waived (Completed)   Comprehensive metabolic panel     Nervous and Auditory   Parkinson's disease without dyskinesia or fluctuating manifestations (HCC)   Chronic. Followed by neurology. Continue collaboration and current medication regimen.  Other   Chronic anxiety   Chronic, stable on Lexapro.  Continue low dose medication and monitor.  Denies SI/HI.      Obesity   BMI 32.29 with some further loss with T2DM, HTN/HLD -- praised for success.  Recommended eating smaller high protein, low fat meals more frequently and exercising 30 mins a day 5 times a week with a goal of 10-15lb weight loss in the next 3 months.  Patient voiced their understanding and motivation to adhere to these recommendations.       Vitamin B12 deficiency   Stable.  Noted low normal on October 2021 labs -- continue supplement and recheck today as is on chronic Metformin and concern for reduction in this level with chronic use.      Relevant Orders   Vitamin B12     Follow up plan: Return in about 6 months (around 08/11/2024) for Annual Physical and diabetes check -- after 08/11/24.

## 2024-02-12 NOTE — Assessment & Plan Note (Signed)
Chronic, stable on Lexapro.  Continue low dose medication and monitor.  Denies SI/HI.

## 2024-02-12 NOTE — Assessment & Plan Note (Signed)
 BMI 32.29 with some further loss with T2DM, HTN/HLD -- praised for success.  Recommended eating smaller high protein, low fat meals more frequently and exercising 30 mins a day 5 times a week with a goal of 10-15lb weight loss in the next 3 months. Patient voiced their understanding and motivation to adhere to these recommendations.

## 2024-02-12 NOTE — Assessment & Plan Note (Signed)
 Chronic, ongoing. Continue current medication regimen and adjust as needed. Follow up in 6 months.

## 2024-02-12 NOTE — Assessment & Plan Note (Signed)
 Chronic. Followed by neurology. Continue collaboration and current medication regimen.

## 2024-02-12 NOTE — Assessment & Plan Note (Addendum)
 Refer to diabetes with obesity plan of care.

## 2024-02-12 NOTE — Progress Notes (Deleted)
 BP 117/75   Pulse 76   Temp 98.5 F (36.9 C) (Oral)   Ht 5' 10.8" (1.798 m)   Wt 230 lb 3.2 oz (104.4 kg)   SpO2 96%   BMI 32.29 kg/m    Subjective:    Patient ID: Brian Frederick, male    DOB: [birth year], 68 y.o.   MRN: [id number]  HPI: Brian Frederick is a 68 y.o. male  Chief Complaint  Patient presents with   Diabetes   Hyperlipidemia   Hypertension   PARKINSON"S DISEASE: Diagnosed in 2015.  Sees Dr. Arbutus Leas. Denies any recent falls or decline in function.  Saw Dr. Arbutus Leas with neurology last on 10/16/23, they increased Rytary. Reports continues to have tremor to left hand.  Goes to dermatology regularly, last 02/21/23.  DIABETES A1c August 5.8%.  Continues Metformin 500 MG BID and Trulicity 3 MG (gets with assistance).  Taking Gabapentin 200 MG BID for neuropathy. B12 taken occasionally.   Hypoglycemic episodes:{Blank single:19197::"yes","no"} Polydipsia/polyuria: {Blank single:19197::"yes","no"} Visual disturbance: {Blank single:19197::"yes","no"} Chest pain: {Blank single:19197::"yes","no"} Paresthesias: {Blank single:19197::"yes","no"} Glucose Monitoring: {Blank single:19197::"yes","no"}  Accucheck frequency: {Blank single:19197::"Not Checking","Daily","BID","TID"}  Fasting glucose:  Post prandial:  Evening:  Before meals: Taking Insulin?: {Blank single:19197::"yes","no"}  Long acting insulin:  Short acting insulin: Blood Pressure Monitoring: {Blank single:19197::"not checking","rarely","daily","weekly","monthly","a few times a day","a few times a week","a few times a month"} Retinal Examination: {Blank single:19197::"Up to Date","Not up to Date"} Foot Exam: {Blank single:19197::"Up to Date","Not up to Date"} Diabetic Education: {Blank single:19197::"Completed","Not Completed"} Pneumovax: {Blank single:19197::"Up to Date","Not up to Date","unknown"} Influenza: {Blank single:19197::"Up to Date","Not up to Date","unknown"} Aspirin: {Blank single:19197::"yes","no"}    HYPERTENSION / HYPERLIPIDEMIA Taking Lisinopril 5 MG daily, Metoprolol 100 MG daily, Lipitor 20 MG, Lovaza + ASA.   Satisfied with current treatment? {Blank single:19197::"yes","no"} Duration of hypertension: {Blank single:19197::"chronic","months","years"} BP monitoring frequency: {Blank single:19197::"not checking","rarely","daily","weekly","monthly","a few times a day","a few times a week","a few times a month"} BP range:  BP medication side effects: {Blank single:19197::"yes","no"} Duration of hyperlipidemia: {Blank single:19197::"chronic","months","years"} Cholesterol medication side effects: {Blank single:19197::"yes","no"} Cholesterol supplements: {Blank multiple:19196::"none","fish oil","niacin","red yeast rice"} Medication compliance: {Blank single:19197::"excellent compliance","good compliance","fair compliance","poor compliance"} Aspirin: {Blank single:19197::"yes","no"} Recent stressors: {Blank single:19197::"yes","no"} Recurrent headaches: {Blank single:19197::"yes","no"} Visual changes: {Blank single:19197::"yes","no"} Palpitations: {Blank single:19197::"yes","no"} Dyspnea: {Blank single:19197::"yes","no"} Chest pain: {Blank single:19197::"yes","no"} Lower extremity edema: {Blank single:19197::"yes","no"} Dizzy/lightheaded: {Blank single:19197::"yes","no"}   ANXIETY Taking Lexapro 10 MG daily. Mood status: {Blank single:19197::"controlled","uncontrolled","better","worse","exacerbated","stable"} Satisfied with current treatment?: {Blank single:19197::"yes","no"} Symptom severity: {Blank single:19197::"mild","moderate","severe"}  Duration of current treatment : {Blank single:19197::"chronic","months","years"} Side effects: {Blank single:19197::"yes","no"} Medication compliance: {Blank single:19197::"excellent compliance","good compliance","fair compliance","poor compliance"} Psychotherapy/counseling: {Blank single:19197::"yes","no"} {Blank single:19197::"current","in the  past"} Depressed mood: {Blank single:19197::"yes","no"} Anxious mood: {Blank single:19197::"yes","no"} Anhedonia: {Blank single:19197::"yes","no"} Significant weight loss or gain: {Blank single:19197::"yes","no"} Insomnia: {Blank single:19197::"yes","no"} {Blank single:19197::"hard to fall asleep","hard to stay asleep"} Fatigue: {Blank single:19197::"yes","no"} Feelings of worthlessness or guilt: {Blank single:19197::"yes","no"} Impaired concentration/indecisiveness: {Blank single:19197::"yes","no"} Suicidal ideations: {Blank single:19197::"yes","no"} Hopelessness: {Blank single:19197::"yes","no"} Crying spells: {Blank single:19197::"yes","no"}    01/28/2024   10:47 AM 01/09/2024    9:41 AM 08/12/2023    8:14 AM 01/22/2023    3:33 PM 08/08/2022    9:32 AM  Depression screen PHQ 2/9  Decreased Interest 1 0 1 0 0  Down, Depressed, Hopeless 0 1 0 0 0  PHQ - 2 Score 1 1 1  0 0  Altered sleeping 0 1 0 0 0  Tired, decreased energy 0 0 1 0 1  Change in appetite 0 0 0 0 0  Feeling bad or failure about yourself  0  0 0 0 0  Trouble concentrating 0 0 0 0 0  Moving slowly or fidgety/restless 0 0 0 0 0  Suicidal thoughts 0 0 0 0 0  PHQ-9 Score 1 2 2  0 1  Difficult doing work/chores Not difficult at all Not difficult at all Not difficult at all Not difficult at all Not difficult at all       01/09/2024    9:41 AM 08/12/2023    8:15 AM 02/08/2023    9:04 AM 08/08/2022    9:33 AM  GAD 7 : Generalized Anxiety Score  Nervous, Anxious, on Edge 0 0 0 0  Control/stop worrying 0 0 0 0  Worry too much - different things 0 0 0 0  Trouble relaxing 0 0 0 0  Restless 0 0 0 0  Easily annoyed or irritable 0 0 0 0  Afraid - awful might happen 0 0 0 0  Total GAD 7 Score 0 0 0 0  Anxiety Difficulty Not difficult at all Not difficult at all  Not difficult at all      Relevant past medical, surgical, family and social history reviewed and updated as indicated. Interim medical history since our last visit  reviewed. Allergies and medications reviewed and updated.  Review of Systems  Per HPI unless specifically indicated above     Objective:    BP 117/75   Pulse 76   Temp 98.5 F (36.9 C) (Oral)   Ht 5' 10.8" (1.798 m)   Wt 230 lb 3.2 oz (104.4 kg)   SpO2 96%   BMI 32.29 kg/m   Wt Readings from Last 3 Encounters:  02/12/24 230 lb 3.2 oz (104.4 kg)  01/28/24 235 lb (106.6 kg)  01/09/24 234 lb 3.2 oz (106.2 kg)    Physical Exam  Results for orders placed or performed in visit on 09/04/23  HM DIABETES EYE EXAM   Collection Time: 08/29/23 12:00 AM  Result Value Ref Range   HM Diabetic Eye Exam No Retinopathy No Retinopathy      Assessment & Plan:   Problem List Items Addressed This Visit       Cardiovascular and Mediastinum   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HCC)   Relevant Orders   Bayer DCA Hb A1c Waived   Comprehensive metabolic panel     Endocrine   Diabetes mellitus treated with injections of non-insulin medication (HCC)   Relevant Orders   Bayer DCA Hb A1c Waived   Microalbumin, Urine Waived   Comprehensive metabolic panel   Hyperlipidemia associated with type 2 diabetes mellitus (HCC)   Relevant Orders   Bayer DCA Hb A1c Waived   Comprehensive metabolic panel   Lipid Panel w/o Chol/HDL Ratio   Type 2 diabetes mellitus with obesity (HCC) - Primary   Relevant Orders   Bayer DCA Hb A1c Waived   Microalbumin, Urine Waived   Comprehensive metabolic panel     Nervous and Auditory   Parkinson's disease without dyskinesia or fluctuating manifestations (HCC)     Other   Chronic anxiety   Obesity   Vitamin B12 deficiency   Relevant Orders   Vitamin B12   Other Visit Diagnoses       Subjective hearing change of both ears            Follow up plan: Return in about 6 months (around 08/11/2024) for Annual Physical and diabetes check -- after 08/11/24.

## 2024-02-12 NOTE — Assessment & Plan Note (Signed)
 Stable.  Noted low normal on October 2021 labs -- continue supplement and recheck today as is on chronic Metformin and concern for reduction in this level with chronic use.

## 2024-02-13 ENCOUNTER — Encounter: Payer: Self-pay | Admitting: Nurse Practitioner

## 2024-02-13 ENCOUNTER — Telehealth: Payer: Self-pay

## 2024-02-13 LAB — VITAMIN B12: Vitamin B-12: 916 pg/mL (ref 232–1245)

## 2024-02-13 LAB — COMPREHENSIVE METABOLIC PANEL
ALT: 12 [IU]/L (ref 0–44)
AST: 16 [IU]/L (ref 0–40)
Albumin: 4.6 g/dL (ref 3.9–4.9)
Alkaline Phosphatase: 97 [IU]/L (ref 44–121)
BUN/Creatinine Ratio: 18 (ref 10–24)
BUN: 20 mg/dL (ref 8–27)
Bilirubin Total: 0.6 mg/dL (ref 0.0–1.2)
CO2: 26 mmol/L (ref 20–29)
Calcium: 9.4 mg/dL (ref 8.6–10.2)
Chloride: 101 mmol/L (ref 96–106)
Creatinine, Ser: 1.14 mg/dL (ref 0.76–1.27)
Globulin, Total: 2.2 g/dL (ref 1.5–4.5)
Glucose: 122 mg/dL — ABNORMAL HIGH (ref 70–99)
Potassium: 4.7 mmol/L (ref 3.5–5.2)
Sodium: 143 mmol/L (ref 134–144)
Total Protein: 6.8 g/dL (ref 6.0–8.5)
eGFR: 70 mL/min/{1.73_m2} (ref >59)

## 2024-02-13 LAB — LIPID PANEL W/O CHOL/HDL RATIO
Cholesterol, Total: 126 mg/dL (ref 100–199)
HDL: 38 mg/dL — ABNORMAL LOW (ref >39)
LDL Chol Calc (NIH): 70 mg/dL (ref 0–99)
Triglycerides: 96 mg/dL (ref 0–149)
VLDL Cholesterol Cal: 18 mg/dL (ref 5–40)

## 2024-02-13 NOTE — Progress Notes (Signed)
   02/13/2024  Patient ID: Brian Frederick, male   DOB: Apr 30, 1956, 68 y.o.   MRN: [id number]  Received fax at [facility] stating information was missing for 2025 [employer] patient assistance program for Trulicity.  I contacted [employer] for clarification, and the entire application needs to be completed and submitted.  Contacted the patient, and he has also received this application in the mail.  Appointment scheduled for 3/12 in person to assist patient with application.  He has also been provided with my direct phone number if he needs anything in the meantime.  Patient endorses having plenty of medication on hand at this time.  Lenna Gilford, PharmD, DPLA

## 2024-02-13 NOTE — Progress Notes (Signed)
 Contacted via MyChart Good evening Brian Frederick, your labs have returned: - Kidney function, creatinine and eGFR, remains normal, as is liver function, AST and ALT.  - Lipid panel looks great.  No medication changes needed. Keep being inspiring!!  Thank you for allowing me to participate in your care.  I appreciate you. Kindest regards, Jayani Rozman

## 2024-02-26 ENCOUNTER — Other Ambulatory Visit: Payer: Self-pay

## 2024-02-26 NOTE — Progress Notes (Unsigned)
   02/26/2024  Patient ID: Brian Frederick, male   DOB: 1956/10/09, 69 y.o.   MRN: [id number]  Patient presenting to complete PAP application for Brian Frederick re-enrollment to continue to receive Brian Frederick 3mg  weekly.  Patient portion completed and copies of POI and insurance card provided.  Provider portion also completed and placed in provider's folder to be signed/dated and faxed into the company with all other application components.  Sending electronic prescription for year's worth of refills to [facility] pharmacy for processing (order pending for PCP to sign).  Will contact company next week to check on status and inform patient.  Lenna Gilford, PharmD, DPLA

## 2024-02-27 MED ORDER — TRULICITY 3 MG/0.5ML ~~LOC~~ SOAJ: 3.0000 mg | SUBCUTANEOUS | 4 refills | Status: DC

## 2024-03-05 ENCOUNTER — Other Ambulatory Visit: Payer: Self-pay | Admitting: Neurology

## 2024-03-05 DIAGNOSIS — G20A1 Parkinson's disease without dyskinesia, without mention of fluctuations: Secondary | ICD-10-CM

## 2024-03-09 ENCOUNTER — Telehealth: Payer: Self-pay

## 2024-03-09 NOTE — Progress Notes (Signed)
   03/09/2024  Patient ID: Brian Frederick, male   DOB: November 08, 1956, 68 y.o.   MRN: [id number]  Contacted [employer] to check on application processing for Trulicity 3mg , and patient was approved 02/28/2024.  Enrollment good through 12/16/2024.  Patient's order was shipped 03/05/2024, and he is enrolled in automatic refills.  Lenna Gilford, PharmD, DPLA

## 2024-03-18 NOTE — Progress Notes (Unsigned)
 Assessment/Plan:   1.  Parkinsons Disease, diagnosed January, 2019.  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  -Continue Rytary 195 mg, 2/2/1.    -For now he will continue trihexyphenidyl, 2 mg 3 times per day.  He has no side effects with it.  -He follows with dermatology regularly.  Last appt 02/21/23  -not interested in surgical interventions  -discussed requip but he has had EDS with pramipexole and doesn't want to take that risk   2.  Diabetes mellitus  -well controlled with last A1c 5.8  3.  Shoulder pain  -he is following with [facility] in [place] (had prior surgery with [facility] in [place])  4.  Morning headache  -declines PSG.  Discussed morbidity/mortality with untreated osa  -could be from positioning of head/neck at night but would guess that OSA is greater risk.  Some neck pain.  He declines MRI cervical spine stating [facility] has done that.  I don't have records as they are out of system  Subjective:   Brian Frederick was seen today in follow up for Parkinsons disease.  My previous records were reviewed prior to todays visit as well as outside records available to me.  Last visit, we increased his Rytary.  He is tolerating that well, without side effects.  He has had no falls.  He saw his nurse practitioner February 26.  His diabetes is under good control.   Current prescribed movement disorder medications: Rytary 195 mg, 2/2/1 Trihexyphenidyl, 2 mg 3 times per day   PREVIOUS MEDICATIONS: Pramipexole caused excessive daytime hypersomnolence (fell asleep while driving and may have caused compulsive eating); carbidopa/levodopa IR (nausea); carbidopa/levodopa CR (still with nausea)  ALLERGIES:   Allergies  Allergen Reactions   Amoxil [Amoxicillin] Swelling    Did it involve swelling of the face/tongue/throat, SOB, or low BP? No Did it involve sudden or severe rash/hives, skin peeling, or any reaction on the inside of your mouth or nose? No Did you need  to seek medical attention at a hospital or doctor's office? No When did it last happen?      15 years ago If all above answers are "NO", may proceed with cephalosporin use.    Penicillin G Benzathine Swelling    Did it involve swelling of the face/tongue/throat, SOB, or low BP? No Did it involve sudden or severe rash/hives, skin peeling, or any reaction on the inside of your mouth or nose? No Did you need to seek medical attention at a hospital or doctor's office? No When did it last happen?      15 years ago If all above answers are "NO", may proceed with cephalosporin use.     CURRENT MEDICATIONS:  Outpatient Encounter Medications as of 03/19/2024  Medication Sig   atorvastatin (LIPITOR) 20 MG tablet Take 1 tablet (20 mg total) by mouth daily.   BD PEN NEEDLE NANO 2ND GEN 32G X 4 MM MISC USE 1 PEN NEEDLE EVERY MORNING   blood glucose meter kit and supplies KIT Dispense based on patient and insurance preference. Use up to four times daily as directed. (FOR ICD-9 250.00, 250.01).   Carbidopa-Levodopa ER (RYTARY) 48.75-195 MG CPCR TAKE 2 CAPSULES BY MOUTH AT 8 AM, 2 AT 1 PM, AND 1 AT 6PM   escitalopram (LEXAPRO) 10 MG tablet Take 1 tablet (10 mg total) by mouth daily.   gabapentin (NEURONTIN) 100 MG capsule TAKE 2 CAPSULE (200 MG) BY MOUTH TWICE A DAY.   lansoprazole (PREVACID) 30  MG capsule TAKE ONE CAPSULE BY MOUTH DAILY AT 12 NOON   lisinopril (ZESTRIL) 2.5 MG tablet Take 1 tablet (2.5 mg total) by mouth daily.   meloxicam (MOBIC) 15 MG tablet Take 1 tablet (15 mg total) by mouth daily as needed for pain.   metoprolol succinate (TOPROL-XL) 100 MG 24 hr tablet TAKE 1 TABLET BY MOUTH EVERY DAY WITH FOOD OR IMMEDIATELY AFTER A MEAL   omega-3 acid ethyl esters (LOVAZA) 1 g capsule Take 1 capsule (1 g total) by mouth 2 (two) times daily.   ONETOUCH VERIO test strip USE TO TEST BLOOD SUGAR UP TO FOUR TIMES DAILY AS DIRECTED   sildenafil (VIAGRA) 100 MG tablet Take 0.5-1 tablets (50-100 mg  total) by mouth daily as needed for erectile dysfunction.   tiZANidine (ZANAFLEX) 4 MG tablet Take 1 tablet by mouth at bedtime as needed.   trihexyphenidyl (ARTANE) 2 MG tablet TAKE 1 TABLET(2 MG) BY MOUTH THREE TIMES DAILY WITH MEALS.   TRULICITY 3 MG/0.5ML SOAJ Inject 3 mg into the skin once a week.   No facility-administered encounter medications on file as of 03/19/2024.    Objective:   PHYSICAL EXAMINATION:    VITALS:   There were no vitals filed for this visit.    Wt Readings from Last 3 Encounters:  02/12/24 230 lb 3.2 oz (104.4 kg)  01/28/24 235 lb (106.6 kg)  01/09/24 234 lb 3.2 oz (106.2 kg)     GEN:  The patient appears stated age and is in NAD. HEENT:  Normocephalic, atraumatic.  The mucous membranes are moist. The superficial temporal arteries are without ropiness or tenderness. CV:  RRR Lungs:  CTAB Neck/HEME:  There are no carotid bruits bilaterally.  Neurological examination:  Orientation: The patient is alert and oriented x3. Cranial nerves: There is good facial symmetry without facial hypomimia. The speech is fluent and clear. Soft palate rises symmetrically and there is no tongue deviation. Hearing is intact to conversational tone. Sensation: Sensation is intact to light touch throughout Motor: Strength is at least antigravity x4.  Movement examination: Tone: There is mod increased tone in the LUE Abnormal movements: There is left upper extremity rest tremor Coordination:  There is mild significant decremation, with any form of RAMS, including alternating supination and pronation of the forearm, hand opening and closing, finger taps, heel taps and toe taps on the L and mild decremation with finger taps on the R. Gait and Station: The patient has no difficulty arising out of a deep-seated chair without the use of the hands. The patient's stride length is slightly decreased but he drags the L leg and appears to have a bit of L foot drop with  ambulation  Examination is all stable from last visit  I have reviewed and interpreted the following labs independently    Chemistry      Component Value Date/Time   NA 143 02/12/2024 1007   NA 142 09/02/2013 0220   K 4.7 02/12/2024 1007   K 4.2 09/02/2013 0220   CL 101 02/12/2024 1007   CL 107 09/02/2013 0220   CO2 26 02/12/2024 1007   CO2 25 09/02/2013 0220   BUN 20 02/12/2024 1007   BUN 23 (H) 09/02/2013 0220   CREATININE 1.14 02/12/2024 1007   CREATININE 1.52 (H) 09/02/2013 0220      Component Value Date/Time   CALCIUM 9.4 02/12/2024 1007   CALCIUM 9.4 09/02/2013 0220   ALKPHOS 97 02/12/2024 1007   ALKPHOS 107 09/02/2013 0220  AST 16 02/12/2024 1007   AST 30 09/02/2013 0220   ALT 12 02/12/2024 1007   ALT 38 09/02/2013 0220   BILITOT 0.6 02/12/2024 1007   BILITOT 0.3 09/02/2013 0220       Lab Results  Component Value Date   WBC 6.1 08/12/2023   HGB 15.2 08/12/2023   HCT 45.6 08/12/2023   MCV 90 08/12/2023   PLT 148 (L) 08/12/2023    Lab Results  Component Value Date   TSH 3.710 08/12/2023   Lab Results  Component Value Date   HGBA1C 5.9 (H) 02/12/2024    Total time spent on today's visit was *** minutes, including both face-to-face time and nonface-to-face time.  Time included that spent on review of records (prior notes available to me/labs/imaging if pertinent), discussing treatment and goals, answering patient's questions and coordinating care.   Cc:  Marjie Skiff, NP

## 2024-03-19 ENCOUNTER — Encounter: Payer: Self-pay | Admitting: Neurology

## 2024-03-19 ENCOUNTER — Ambulatory Visit: Payer: Medicare Other | Admitting: Neurology

## 2024-03-19 VITALS — BP 126/74 | HR 78 | Ht 71.0 in | Wt 234.8 lb

## 2024-03-19 DIAGNOSIS — G20A1 Parkinson's disease without dyskinesia, without mention of fluctuations: Secondary | ICD-10-CM | POA: Diagnosis not present

## 2024-04-30 ENCOUNTER — Ambulatory Visit: Payer: Medicare Other | Admitting: Dermatology

## 2024-04-30 DIAGNOSIS — D1801 Hemangioma of skin and subcutaneous tissue: Secondary | ICD-10-CM

## 2024-04-30 DIAGNOSIS — Z5111 Encounter for antineoplastic chemotherapy: Secondary | ICD-10-CM

## 2024-04-30 DIAGNOSIS — Z79899 Other long term (current) drug therapy: Secondary | ICD-10-CM

## 2024-04-30 DIAGNOSIS — L578 Other skin changes due to chronic exposure to nonionizing radiation: Secondary | ICD-10-CM | POA: Diagnosis not present

## 2024-04-30 DIAGNOSIS — L814 Other melanin hyperpigmentation: Secondary | ICD-10-CM | POA: Diagnosis not present

## 2024-04-30 DIAGNOSIS — W908XXA Exposure to other nonionizing radiation, initial encounter: Secondary | ICD-10-CM

## 2024-04-30 DIAGNOSIS — L570 Actinic keratosis: Secondary | ICD-10-CM

## 2024-04-30 DIAGNOSIS — D692 Other nonthrombocytopenic purpura: Secondary | ICD-10-CM | POA: Diagnosis not present

## 2024-04-30 DIAGNOSIS — Z7189 Other specified counseling: Secondary | ICD-10-CM | POA: Diagnosis not present

## 2024-04-30 DIAGNOSIS — L821 Other seborrheic keratosis: Secondary | ICD-10-CM

## 2024-04-30 DIAGNOSIS — Z1283 Encounter for screening for malignant neoplasm of skin: Secondary | ICD-10-CM

## 2024-04-30 NOTE — Patient Instructions (Addendum)
 In 4-6 weeks begin 5FU/Calcipotriene cream apply at thin film to scalp/forehead twice a day for 10 days   5-Fluorouracil /Calcipotriene Patient Education   Actinic keratoses are the dry, red scaly spots on the skin caused by sun damage. A portion of these spots can turn into skin cancer with time, and treating them can help prevent development of skin cancer.   Treatment of these spots requires removal of the defective skin cells. There are various ways to remove actinic keratoses, including freezing with liquid nitrogen, treatment with creams, or treatment with a blue light procedure in the office.   5-fluorouracil  cream is a topical cream used to treat actinic keratoses. It works by interfering with the growth of abnormal fast-growing skin cells, such as actinic keratoses. These cells peel off and are replaced by healthy ones.   5-fluorouracil /calcipotriene is a combination of the 5-fluorouracil  cream with a vitamin D  analog cream called calcipotriene. The calcipotriene alone does not treat actinic keratoses. However, when it is combined with 5-fluorouracil , it helps the 5-fluorouracil  treat the actinic keratoses much faster so that the same results can be achieved with a much shorter treatment time.  INSTRUCTIONS FOR 5-FLUOROURACIL /CALCIPOTRIENE CREAM:   5-fluorouracil /calcipotriene cream typically only needs to be used for 4-7 days. A thin layer should be applied twice a day to the treatment areas recommended by your physician.   If your physician prescribed you separate tubes of 5-fluourouracil and calcipotriene, apply a thin layer of 5-fluorouracil  followed by a thin layer of calcipotriene.   Avoid contact with your eyes, nostrils, and mouth. Do not use 5-fluorouracil /calcipotriene cream on infected or open wounds.   You will develop redness, irritation and some crusting at areas where you have pre-cancer damage/actinic keratoses. IF YOU DEVELOP PAIN, BLEEDING, OR SIGNIFICANT CRUSTING, STOP  THE TREATMENT EARLY - you have already gotten a good response and the actinic keratoses should clear up well.  Wash your hands after applying 5-fluorouracil  5% cream on your skin.   A moisturizer or sunscreen with a minimum SPF 30 should be applied each morning.   Once you have finished the treatment, you can apply a thin layer of Vaseline twice a day to irritated areas to soothe and calm the areas more quickly. If you experience significant discomfort, contact your physician.  For some patients it is necessary to repeat the treatment for best results.  SIDE EFFECTS: When using 5-fluorouracil /calcipotriene cream, you may have mild irritation, such as redness, dryness, swelling, or a mild burning sensation. This usually resolves within 2 weeks. The more actinic keratoses you have, the more redness and inflammation you can expect during treatment. Eye irritation has been reported rarely. If this occurs, please let us  know.  If you have any trouble using this cream, please call the office. If you have any other questions about this information, please do not hesitate to ask me before you leave the office.      Cryotherapy Aftercare  Wash gently with soap and water  everyday.   Apply Vaseline and Band-Aid daily until healed.       Due to recent changes in healthcare laws, you may see results of your pathology and/or laboratory studies on MyChart before the doctors have had a chance to review them. We understand that in some cases there may be results that are confusing or concerning to you. Please understand that not all results are received at the same time and often the doctors may need to interpret multiple results in order to provide you with  the best plan of care or course of treatment. Therefore, we ask that you please give us  2 business days to thoroughly review all your results before contacting the office for clarification. Should we see a critical lab result, you will be contacted  sooner.   If You Need Anything After Your Visit  If you have any questions or concerns for your doctor, please call our main line at [phone] and press option 4 to reach your doctor's medical assistant. If no one answers, please leave a voicemail as directed and we will return your call as soon as possible. Messages left after 4 pm will be answered the following business day.   You may also send us  a message via MyChart. We typically respond to MyChart messages within 1-2 business days.  For prescription refills, please ask your pharmacy to contact our office. Our fax number is [phone].  If you have an urgent issue when the clinic is closed that cannot wait until the next business day, you can page your doctor at the number below.    Please note that while we do our best to be available for urgent issues outside of office hours, we are not available 24/7.   If you have an urgent issue and are unable to reach us , you may choose to seek medical care at your doctor's office, retail clinic, urgent care center, or emergency room.  If you have a medical emergency, please immediately call 911 or go to the emergency department.  Pager Numbers  - Dr. Bary Likes: [phone]  - Dr. Annette Barters: [phone]  - Dr. Felipe Horton: [phone]   In the event of inclement weather, please call our main line at [phone] for an update on the status of any delays or closures.  Dermatology Medication Tips: Please keep the boxes that topical medications come in in order to help keep track of the instructions about where and how to use these. Pharmacies typically print the medication instructions only on the boxes and not directly on the medication tubes.   If your medication is too expensive, please contact our office at [phone] option 4 or send us  a message through MyChart.   We are unable to tell what your co-pay for medications will be in advance as this is different depending on your  insurance coverage. However, we may be able to find a substitute medication at lower cost or fill out paperwork to get insurance to cover a needed medication.   If a prior authorization is required to get your medication covered by your insurance company, please allow us  1-2 business days to complete this process.  Drug prices often vary depending on where the prescription is filled and some pharmacies may offer cheaper prices.  The website www.goodrx.com contains coupons for medications through different pharmacies. The prices here do not account for what the cost may be with help from insurance (it may be cheaper with your insurance), but the website can give you the price if you did not use any insurance.  - You can print the associated coupon and take it with your prescription to the pharmacy.  - You may also stop by our office during regular business hours and pick up a GoodRx coupon card.  - If you need your prescription sent electronically to a different pharmacy, notify our office through [facility] or by phone at [phone] option 4.     Si Usted Necesita Algo Despus de Su Visita  Tambin puede enviarnos un mensaje a Vinton Greig  de MyChart. Por lo general respondemos a los mensajes de MyChart en el transcurso de 1 a 2 das hbiles.  Para renovar recetas, por favor pida a su farmacia que se ponga en contacto con nuestra oficina. Franz Jacks de fax es [place] [phone].  Si tiene un asunto urgente cuando la clnica est cerrada y que no puede esperar hasta el siguiente da hbil, puede llamar/localizar a su doctor(a) al nmero que aparece a continuacin.   Por favor, tenga en cuenta que aunque hacemos todo lo posible para estar disponibles para asuntos urgentes fuera del horario de [place], no estamos disponibles las 24 horas del da, los 7 [address]  [address] de la [place].   Si tiene un problema urgente y no puede comunicarse con nosotros, puede optar por buscar atencin mdica  en el  consultorio de su doctor(a), en una clnica privada, en un centro de atencin urgente o en una sala de emergencias.  Si tiene Engineer, drilling, por favor llame inmediatamente al 911 o vaya a la sala de emergencias.  Nmeros de bper  - Dr. Bary Likes: [phone]  - Dra. Annette Barters: [phone]  - Dr. Felipe Horton: [phone]   En caso de inclemencias del tiempo, por favor llame a Lajuan Pila principal al [phone] para una actualizacin sobre el [place] de cualquier retraso o cierre.  Consejos para la medicacin en dermatologa: Por favor, guarde las cajas en las que vienen los medicamentos de uso tpico para ayudarle a seguir las instrucciones sobre dnde y cmo usarlos. Las farmacias generalmente imprimen las instrucciones del medicamento slo en las cajas y no directamente en los tubos del [place].   Si su medicamento es muy caro, por favor, pngase en contacto con Bettyjane Brunet llamando al [phone] y presione la opcin 4 o envenos un mensaje a travs de Clinical cytogeneticist.   No podemos decirle cul ser su copago por los medicamentos por adelantado ya que esto es diferente dependiendo de la cobertura de su seguro. Sin embargo, es posible que podamos encontrar un medicamento sustituto a Audiological scientist un formulario para que el seguro cubra el medicamento que se considera necesario.   Si se requiere una autorizacin previa para que su compaa de seguros [place] su medicamento, por favor permtanos de 1 a 2 das hbiles para completar este proceso.  Los precios de los medicamentos varan con frecuencia dependiendo del Environmental consultant de dnde se surte la receta y alguna farmacias pueden ofrecer precios ms baratos.  El sitio web www.goodrx.com tiene cupones para medicamentos de Health and safety inspector. Los precios aqu no tienen en cuenta lo que podra costar con la ayuda del seguro (puede ser ms barato con su seguro), pero el sitio web puede darle el precio si no utiliz Tourist information centre manager.  -  Puede imprimir el cupn correspondiente y llevarlo con su receta a la farmacia.  - Tambin puede pasar por nuestra oficina durante el horario de atencin regular y Education officer, museum una tarjeta de cupones de GoodRx.  - Si necesita que su receta se enve electrnicamente a una farmacia diferente, informe a nuestra oficina a travs de MyChart de  o por telfono llamando al [phone] y presione la opcin 4.

## 2024-04-30 NOTE — Progress Notes (Signed)
 Follow-Up Visit   Subjective  Brian Frederick is a 68 y.o. male who presents for the following: Skin Cancer Screening and Upper Body Skin Exam, 6 months f/u hx of precancers  The patient presents for Upper Body Skin Exam (UBSE) for skin cancer screening and mole check. The patient has spots, moles and lesions to be evaluated, some may be new or changing and the patient may have concern these could be cancer.    The following portions of the chart were reviewed this encounter and updated as appropriate: medications, allergies, medical history  Review of Systems:  No other skin or systemic complaints except as noted in HPI or Assessment and Plan.  Objective  Well appearing patient in no apparent distress; mood and affect are within normal limits.  All skin waist up examined. Relevant physical exam findings are noted in the Assessment and Plan.  Scalp, forehead (18) Erythematous thin papules/macules with gritty scale.   Assessment & Plan   AK (ACTINIC KERATOSIS) (18) Scalp, forehead (18) ACTINIC DAMAGE WITH PRECANCEROUS ACTINIC KERATOSES Counseling for Topical Chemotherapy Management: Patient exhibits: - Severe, confluent actinic changes with pre-cancerous actinic keratoses that is secondary to cumulative UV radiation exposure over time - Condition that is severe; chronic, not at goal. - diffuse scaly erythematous macules and papules with underlying dyspigmentation - Discussed Prescription "Field Treatment" topical Chemotherapy for Severe, Chronic Confluent Actinic Changes with Pre-Cancerous Actinic Keratoses Field treatment involves treatment of an entire area of skin that has confluent Actinic Changes (Sun/ Ultraviolet light damage) and PreCancerous Actinic Keratoses by method of PhotoDynamic Therapy (PDT) and/or prescription Topical Chemotherapy agents such as 5-fluorouracil , 5-fluorouracil /calcipotriene, and/or imiquimod.  The purpose is to decrease the number of clinically  evident and subclinical PreCancerous lesions to prevent progression to development of skin cancer by chemically destroying early precancer changes that may or may not be visible.  It has been shown to reduce the risk of developing skin cancer in the treated area. As a result of treatment, redness, scaling, crusting, and open sores may occur during treatment course. One or more than one of these methods may be used and may have to be used several times to control, suppress and eliminate the PreCancerous changes. Discussed treatment course, expected reaction, and possible side effects. - Recommend daily broad spectrum sunscreen SPF 30+ to sun-exposed areas, reapply every 2 hours as needed.  - Staying in the shade or wearing long sleeves, sun glasses (UVA+UVB protection) and wide brim hats (4-inch brim around the entire circumference of the hat) are also recommended. - Call for new or changing lesions.   In 4 weeks  Start 5FU/Calcipotriene cream apply to scalp twice a day x 7 days  Destruction of lesion - Scalp, forehead (18) Complexity: simple   Destruction method: cryotherapy   Informed consent: discussed and consent obtained   Timeout:  patient name, date of birth, surgical site, and procedure verified Lesion destroyed using liquid nitrogen: Yes   Region frozen until ice ball extended beyond lesion: Yes   Outcome: patient tolerated procedure well with no complications   Post-procedure details: wound care instructions given   Skin cancer screening performed today.  Actinic Damage - Chronic condition, secondary to cumulative UV/sun exposure - diffuse scaly erythematous macules with underlying dyspigmentation - Recommend daily broad spectrum sunscreen SPF 30+ to sun-exposed areas, reapply every 2 hours as needed.  - Staying in the shade or wearing long sleeves, sun glasses (UVA+UVB protection) and wide brim hats (4-inch brim around the entire  circumference of the hat) are also recommended for sun  protection.  - Call for new or changing lesions.  Lentigines, Seborrheic Keratoses, Hemangiomas - Benign normal skin lesions - Benign-appearing - Call for any changes  Melanocytic Nevi - Tan-brown and/or pink-flesh-colored symmetric macules and papules - Benign appearing on exam today - Observation - Call clinic for new or changing moles - Recommend daily use of broad spectrum spf 30+ sunscreen to sun-exposed areas.   Purpura - Chronic; persistent and recurrent.  Treatable, but not curable. - Violaceous macules and patches - Benign - Related to trauma, age, sun damage and/or use of blood thinners, chronic use of topical and/or oral steroids - Observe - Can use OTC arnica containing moisturizer such as Dermend Bruise Formula if desired - Call for worsening or other concerns   Return in about 9 months (around 01/31/2025) for Aks.  IClara Crisp, CMA, am acting as scribe for Celine Collard, MD .   Documentation: I have reviewed the above documentation for accuracy and completeness, and I agree with the above.  Celine Collard, MD

## 2024-05-04 ENCOUNTER — Encounter: Payer: Self-pay | Admitting: Dermatology

## 2024-06-10 ENCOUNTER — Other Ambulatory Visit: Payer: Self-pay

## 2024-06-10 ENCOUNTER — Telehealth: Payer: Self-pay | Admitting: Nurse Practitioner

## 2024-06-10 ENCOUNTER — Telehealth: Payer: Self-pay

## 2024-06-10 MED ORDER — TRULICITY 3 MG/0.5ML ~~LOC~~ SOAJ: 3.0000 mg | SUBCUTANEOUS | 4 refills | Status: AC

## 2024-06-10 NOTE — Telephone Encounter (Signed)
 Copied from CRM [id number]. Topic: Clinical - Medication Refill >> Jun 10, 2024  1:46 PM Rosaria E wrote: Medication: TRULICITY  3 MG/0.5ML SOAJ  Has the patient contacted their pharmacy? Yes (Agent: If no, request that the patient contact the pharmacy for the refill. If patient does not wish to contact the pharmacy document the reason why and proceed with request.) (Agent: If yes, when and what did the pharmacy advise?)  This is the patient's preferred pharmacy:   Says he wants this directly from [facility] apparently this was coordinated with [facility]  Is this the correct pharmacy for this prescription? Yes If no, delete pharmacy and type the correct one.   Has the prescription been filled recently? Yes  Is the patient out of the medication? Yes  Has the patient been seen for an appointment in the last year OR does the patient have an upcoming appointment? Yes  Can we respond through MyChart? Yes  Agent: Please be advised that Rx refills may take up to 3 business days. We ask that you follow-up with your pharmacy.

## 2024-06-10 NOTE — Telephone Encounter (Signed)
 Great, thanks so much Ethridge!

## 2024-06-10 NOTE — Telephone Encounter (Signed)
 [facility] a call pt is coming up due for a refill, when [facility] representative explain pt does not have any more refill and needs a new rx to be call in or fax.will let [facility] know.

## 2024-06-10 NOTE — Progress Notes (Signed)
   06/10/2024  Patient ID: Brian Frederick, male   DOB: Dec 19, 1955, 68 y.o.   MRN: [id number]  [facility] is needing a new prescription sent to [facility] for patient's Trulicity  3mg  weekly received through the program.  Patient is currently enrolled through 12/16/2024.  Prescription pending for PCP to sign if in agreement.  Brian Frederick, PharmD, DPLA

## 2024-06-10 NOTE — Telephone Encounter (Signed)
 Channing, is this patient's assistance up to date? And does he have refills?

## 2024-06-13 ENCOUNTER — Other Ambulatory Visit: Payer: Self-pay | Admitting: Neurology

## 2024-06-13 DIAGNOSIS — G20A1 Parkinson's disease without dyskinesia, without mention of fluctuations: Secondary | ICD-10-CM

## 2024-06-14 ENCOUNTER — Other Ambulatory Visit: Payer: Self-pay | Admitting: Neurology

## 2024-06-14 DIAGNOSIS — G20A1 Parkinson's disease without dyskinesia, without mention of fluctuations: Secondary | ICD-10-CM

## 2024-08-11 ENCOUNTER — Other Ambulatory Visit: Payer: Self-pay | Admitting: Nurse Practitioner

## 2024-08-13 NOTE — Telephone Encounter (Signed)
 Requested Prescriptions  Pending Prescriptions Disp Refills   omega-3 acid ethyl esters (LOVAZA ) 1 g capsule [Pharmacy Med Name: OMEGA-3-ACID  1GM CAPSULES (RX)] 60 capsule 5    Sig: TAKE 1 CAPSULE BY MOUTH TWICE DAILY     Endocrinology:  Nutritional Agents - omega-3 acid ethyl esters Failed - 08/13/2024 12:50 PM      Failed - Lipid Panel in normal range within the last 12 months    Cholesterol, Total  Date Value Ref Range Status  02/12/2024 126 100 - 199 mg/dL Final   Cholesterol Piccolo, Waived  Date Value Ref Range Status  02/15/2020 120 <200 mg/dL Final    Comment:                            Desirable                <200                         Borderline High      200- 239                         High                     >239    LDL Chol Calc (NIH)  Date Value Ref Range Status  02/12/2024 70 0 - 99 mg/dL Final   HDL  Date Value Ref Range Status  02/12/2024 38 (L) >39 mg/dL Final   Triglycerides  Date Value Ref Range Status  02/12/2024 96 0 - 149 mg/dL Final   Triglycerides Piccolo,Waived  Date Value Ref Range Status  02/15/2020 156 (H) <150 mg/dL Final    Comment:                            Normal                   <150                         Borderline High     150 - 199                         High                200 - 499                         Very High                >499          Passed - Valid encounter within last 12 months    Recent Outpatient Visits           6 months ago Type 2 diabetes mellitus with obesity (HCC)   [facility] Valerio Melanie DASEN, NP       Future Appointments             In 5 months Hester Alm BROCKS, MD [facility]

## 2024-08-14 ENCOUNTER — Other Ambulatory Visit: Payer: Self-pay | Admitting: Neurology

## 2024-08-14 DIAGNOSIS — G20A1 Parkinson's disease without dyskinesia, without mention of fluctuations: Secondary | ICD-10-CM

## 2024-08-14 DIAGNOSIS — R519 Headache, unspecified: Secondary | ICD-10-CM

## 2024-08-23 NOTE — Patient Instructions (Signed)

## 2024-08-24 DIAGNOSIS — E119 Type 2 diabetes mellitus without complications: Secondary | ICD-10-CM | POA: Diagnosis not present

## 2024-08-24 DIAGNOSIS — H43813 Vitreous degeneration, bilateral: Secondary | ICD-10-CM | POA: Diagnosis not present

## 2024-08-24 DIAGNOSIS — H2513 Age-related nuclear cataract, bilateral: Secondary | ICD-10-CM | POA: Diagnosis not present

## 2024-08-27 ENCOUNTER — Ambulatory Visit (INDEPENDENT_AMBULATORY_CARE_PROVIDER_SITE_OTHER): Payer: Medicare Other | Admitting: Nurse Practitioner

## 2024-08-27 ENCOUNTER — Encounter: Payer: Self-pay | Admitting: Nurse Practitioner

## 2024-08-27 VITALS — BP 116/77 | HR 71 | Temp 98.4°F | Resp 17 | Ht 70.98 in | Wt 235.0 lb

## 2024-08-27 DIAGNOSIS — Z0000 Encounter for general adult medical examination without abnormal findings: Secondary | ICD-10-CM

## 2024-08-27 DIAGNOSIS — K219 Gastro-esophageal reflux disease without esophagitis: Secondary | ICD-10-CM | POA: Diagnosis not present

## 2024-08-27 DIAGNOSIS — E669 Obesity, unspecified: Secondary | ICD-10-CM

## 2024-08-27 DIAGNOSIS — Z23 Encounter for immunization: Secondary | ICD-10-CM

## 2024-08-27 DIAGNOSIS — E66811 Obesity, class 1: Secondary | ICD-10-CM

## 2024-08-27 DIAGNOSIS — Z7985 Long-term (current) use of injectable non-insulin antidiabetic drugs: Secondary | ICD-10-CM | POA: Diagnosis not present

## 2024-08-27 DIAGNOSIS — E119 Type 2 diabetes mellitus without complications: Secondary | ICD-10-CM | POA: Diagnosis not present

## 2024-08-27 DIAGNOSIS — E1169 Type 2 diabetes mellitus with other specified complication: Secondary | ICD-10-CM | POA: Diagnosis not present

## 2024-08-27 DIAGNOSIS — E6609 Other obesity due to excess calories: Secondary | ICD-10-CM

## 2024-08-27 DIAGNOSIS — E538 Deficiency of other specified B group vitamins: Secondary | ICD-10-CM

## 2024-08-27 DIAGNOSIS — N400 Enlarged prostate without lower urinary tract symptoms: Secondary | ICD-10-CM

## 2024-08-27 DIAGNOSIS — G20A1 Parkinson's disease without dyskinesia, without mention of fluctuations: Secondary | ICD-10-CM | POA: Diagnosis not present

## 2024-08-27 DIAGNOSIS — F419 Anxiety disorder, unspecified: Secondary | ICD-10-CM

## 2024-08-27 DIAGNOSIS — E785 Hyperlipidemia, unspecified: Secondary | ICD-10-CM | POA: Diagnosis not present

## 2024-08-27 DIAGNOSIS — E1159 Type 2 diabetes mellitus with other circulatory complications: Secondary | ICD-10-CM | POA: Diagnosis not present

## 2024-08-27 LAB — BAYER DCA HB A1C WAIVED: HB A1C (BAYER DCA - WAIVED): 5.9 % — ABNORMAL HIGH (ref 4.8–5.6)

## 2024-08-27 MED ORDER — LISINOPRIL 2.5 MG PO TABS: 2.5000 mg | ORAL_TABLET | Freq: Every day | ORAL | 4 refills | Status: AC

## 2024-08-27 MED ORDER — ESCITALOPRAM OXALATE 10 MG PO TABS: 10.0000 mg | ORAL_TABLET | Freq: Every day | ORAL | 4 refills | Status: AC

## 2024-08-27 MED ORDER — GABAPENTIN 100 MG PO CAPS: ORAL_CAPSULE | ORAL | 4 refills | Status: AC

## 2024-08-27 MED ORDER — OMEGA-3-ACID ETHYL ESTERS 1 G PO CAPS: 1.0000 | ORAL_CAPSULE | Freq: Two times a day (BID) | ORAL | 3 refills | Status: AC

## 2024-08-27 MED ORDER — METOPROLOL SUCCINATE ER 100 MG PO TB24: ORAL_TABLET | ORAL | 4 refills | Status: AC

## 2024-08-27 MED ORDER — LANSOPRAZOLE 30 MG PO CPDR: DELAYED_RELEASE_CAPSULE | ORAL | 4 refills | Status: AC

## 2024-08-27 NOTE — Assessment & Plan Note (Signed)
 Refer to diabetes with obesity plan of care.

## 2024-08-27 NOTE — Assessment & Plan Note (Signed)
Chronic.  Followed by neurology, continue collaboration and current medication regimen as prescribed by them.  Recent note reviewed and labs.

## 2024-08-27 NOTE — Assessment & Plan Note (Signed)
 Chronic, ongoing with A1c 5.9% today, remaining very stable. Will continue Trulicity  3 MG weekly via assistance program.  Urine ALB 30 February 2025, continue ACE daily.  Check BS 2-3 times a day and document + continue heavy focus on diet.  May benefit addition of SGLT2 for cardiac health in future to maintain A1c goal if elevations - with goal to maintain off insulin . CCM involved with patient.  Return in 6 months. - Foot and eye exam up to date - ACE and statin on board - Vaccinations up to date.

## 2024-08-27 NOTE — Assessment & Plan Note (Signed)
Chronic, stable. Continue current medication regimen and adjust as needed.  Mag level today. 

## 2024-08-27 NOTE — Progress Notes (Signed)
 BP 116/77 (BP Location: Left Arm, Patient Position: Sitting, Cuff Size: Large)   Pulse 71   Temp 98.4 F (36.9 C) (Oral)   Resp 17   Ht 5' 10.98 (1.803 m)   Wt 235 lb (106.6 kg)   SpO2 98%   BMI 32.79 kg/m    Subjective:    Patient ID: Brian Frederick, male    DOB: 1956-10-26, 68 y.o.   MRN: [id number]  HPI: RUVIM RISKO is a 68 y.o. male presenting on 2024-08-30 for comprehensive medical examination. Current medical complaints include:none  He currently lives with: wife Interim Problems from his last visit: no   PARKINSON'S DISEASE: Diagnosed in 2015. Follows with Dr. Evonnie. Had one fall a week ago while swapping from boat trailer and swung back at him, got a scratch to left shin bone. Last visit with Dr. Evonnie was on 03/19/24, continues Rytary  and Artane . Has ongoing tremors to hands. Goes to dermatology regularly, last 04/30/24.  DIABETES February A1c 5.9%.  Continues to take Trulicity  3 MG (gets with assistance), was able to stop Metformin  due to being well controlled with Trulicity . Is taking Gabapentin  200 MG BID for neuropathy + B12 supplement. Hypoglycemic episodes:no Polydipsia/polyuria: no Visual disturbance: no Chest pain: no Paresthesias: no Glucose Monitoring: yes             Accucheck frequency: Every morning             Fasting glucose: 110 to 120, occasionally a little higher with pasta             Post prandial:             Evening:             Before meals: Taking Insulin ?: none             Long acting insulin : was in past             Short acting insulin : Blood Pressure Monitoring: weekly Retinal Examination: Up to Date -- [facility] Foot Exam: Up to Date Pneumovax: Up to Date Influenza: Up to Date Aspirin: yes    HYPERTENSION / HYPERLIPIDEMIA Takes Lisinopril  2.5 MG daily, Metoprolol  100 MG daily, Lipitor 20 MG, Lovaza  + ASA.  Had open heart surgery in 2014. Satisfied with current treatment? yes Duration of hypertension: chronic BP monitoring  frequency: not checking BP range:  BP medication side effects: no Duration of hyperlipidemia: chronic Cholesterol medication side effects: no Cholesterol supplements: none Medication compliance: good compliance Aspirin: yes Recent stressors: no Recurrent headaches: no Visual changes: no Palpitations: no Dyspnea: no Chest pain: no Lower extremity edema: no Dizzy/lightheaded: if stands up too fast  ANXIETY/STRESS Taking Lexapro  10 MG daily. Duration:controlled Anxious mood: no  Excessive worrying: no Irritability: no  Sweating: no Nausea: no Palpitations:no Hyperventilation: no Panic attacks: no Agoraphobia: no  Obscessions/compulsions: no Depressed mood: no Anhedonia: no Weight changes: no Insomnia: no Hypersomnia: no Fatigue/loss of energy: no Feelings of worthlessness: no Feelings of guilt: no Impaired concentration/indecisiveness: no Suicidal ideations: no  Crying spells: no Recent Stressors/Life Changes: no   Relationship problems: no   Family stress: no     Financial stress: no    Job stress: no    Recent death/loss: no    08/30/24    8:38 AM 01/28/2024   10:47 AM 01/09/2024    9:41 AM 08/12/2023    8:14 AM 01/22/2023    3:33 PM  Depression screen PHQ 2/9  Decreased Interest 1 1  0 1 0  Down, Depressed, Hopeless 0 0 1 0 0  PHQ - 2 Score 1 1 1 1  0  Altered sleeping 0 0 1 0 0  Tired, decreased energy 0 0 0 1 0  Change in appetite 0 0 0 0 0  Feeling bad or failure about yourself  0 0 0 0 0  Trouble concentrating 0 0 0 0 0  Moving slowly or fidgety/restless 0 0 0 0 0  Suicidal thoughts 0 0 0 0 0  PHQ-9 Score 1 1 2 2  0  Difficult doing work/chores Not difficult at all Not difficult at all Not difficult at all Not difficult at all Not difficult at all      08/27/2024    8:38 AM 01/09/2024    9:41 AM 08/12/2023    8:15 AM 02/08/2023    9:04 AM  GAD 7 : Generalized Anxiety Score  Nervous, Anxious, on Edge 0 0 0 0  Control/stop worrying 0 0 0 0  Worry  too much - different things 0 0 0 0  Trouble relaxing 0 0 0 0  Restless 0 0 0 0  Easily annoyed or irritable 0 0 0 0  Afraid - awful might happen 0 0 0 0  Total GAD 7 Score 0 0 0 0  Anxiety Difficulty Not difficult at all Not difficult at all Not difficult at all    GERD Takes Prevacid  30 MG daily.  GERD control status: stable  Satisfied with current treatment? yes Heartburn frequency:  Medication side effects: no  Medication compliance: stable Dysphagia: no Odynophagia:  no Hematemesis: no Blood in stool: no EGD: no   Functional Status Survey: Is the patient deaf or have difficulty hearing?: No Does the patient have difficulty seeing, even when wearing glasses/contacts?: No Does the patient have difficulty concentrating, remembering, or making decisions?: No Does the patient have difficulty walking or climbing stairs?: No Does the patient have difficulty dressing or bathing?: No Does the patient have difficulty doing errands alone such as visiting a doctor's office or shopping?: No  FALL RISK:    08/27/2024    8:49 AM 08/27/2024    8:44 AM 03/19/2024    2:35 PM 01/28/2024   10:50 AM 01/09/2024    9:41 AM  Fall Risk   Falls in the past year? 0 0 0 0 0  Number falls in past yr: 0 0 0 0 0  Injury with Fall? 1 0 0 0 0  Risk for fall due to : History of fall(s) No Fall Risks  No Fall Risks No Fall Risks  Follow up Falls evaluation completed Falls prevention discussed Falls evaluation completed Falls prevention discussed;Falls evaluation completed Falls evaluation completed   Advanced Directives <no information>  Past Medical History:  Past Medical History:  Diagnosis Date   Achilles tendon disorder    Anxiety    Basal cell carcinoma 06/23/2008   Mid dorsum nose. Ulcerated.   BPH (benign prostatic hypertrophy)    Cancer (HCC)    basal cell   Chronic kidney disease    Dysrhythmia    GERD (gastroesophageal reflux disease)    Hyperlipidemia    Hypertension     Hypogonadism in male    Osteoarthritis    Prediabetes    Squamous cell carcinoma of skin 10/27/2013   Right distal lateral thigh. SCCis   SVT (supraventricular tachycardia) (HCC)     Surgical History:  Past Surgical History:  Procedure Laterality Date   BASAL CELL CARCINOMA  EXCISION     CARDIAC ELECTROPHYSIOLOGY MAPPING AND ABLATION     43yrs ago   cardio oblation     CHOLECYSTECTOMY     COLONOSCOPY WITH PROPOFOL  N/A 04/12/2017   Procedure: COLONOSCOPY WITH PROPOFOL ;  Surgeon: Rogelia Copping, MD;  Location: [facility];  Service: Endoscopy;  Laterality: N/A;   COLONOSCOPY WITH PROPOFOL  N/A 09/25/2022   Procedure: COLONOSCOPY WITH PROPOFOL ;  Surgeon: Copping Rogelia, MD;  Location: [facility];  Service: Endoscopy;  Laterality: N/A;   JOINT REPLACEMENT Left    shoulder   JOINT REPLACEMENT Right    shoulder   POLYPECTOMY N/A 04/12/2017   Procedure: POLYPECTOMY;  Surgeon: Rogelia Copping, MD;  Location: [facility];  Service: Endoscopy;  Laterality: N/A;   SHOULDER SURGERY Right 11/28/2018    Medications:  Current Outpatient Medications on File Prior to Visit  Medication Sig   atorvastatin  (LIPITOR) 20 MG tablet Take 1 tablet (20 mg total) by mouth daily.   BD PEN NEEDLE NANO 2ND GEN 32G X 4 MM MISC USE 1 PEN NEEDLE EVERY MORNING   blood glucose meter kit and supplies KIT Dispense based on patient and insurance preference. Use up to four times daily as directed. (FOR ICD-9 250.00, 250.01).   meloxicam  (MOBIC ) 15 MG tablet Take 1 tablet (15 mg total) by mouth daily as needed for pain.   ONETOUCH VERIO test strip USE TO TEST BLOOD SUGAR UP TO FOUR TIMES DAILY AS DIRECTED   RYTARY  48.75-195 MG CPCR TAKE 2 CAPSULES BY MOUTH AT 8 AM, 2 AT 1 PM, AND 1 AT 6PM   sildenafil  (VIAGRA ) 100 MG tablet Take 0.5-1 tablets (50-100 mg total) by mouth daily as needed for erectile dysfunction.   tiZANidine (ZANAFLEX) 4 MG tablet Take 1 tablet by mouth at bedtime as needed.   trihexyphenidyl   (ARTANE ) 2 MG tablet TAKE 1 TABLET(2 MG) BY MOUTH THREE TIMES DAILY WITH MEALS.   TRULICITY  3 MG/0.5ML SOAJ Inject 3 mg into the skin once a week.   No current facility-administered medications on file prior to visit.    Allergies:  Allergies  Allergen Reactions   Amoxil  [Amoxicillin ] Swelling    Did it involve swelling of the face/tongue/throat, SOB, or low BP? No Did it involve sudden or severe rash/hives, skin peeling, or any reaction on the inside of your mouth or nose? No Did you need to seek medical attention at a hospital or doctor's office? No When did it last happen?      15 years ago If all above answers are NO, may proceed with cephalosporin use.    Penicillin G Benzathine Swelling    Did it involve swelling of the face/tongue/throat, SOB, or low BP? No Did it involve sudden or severe rash/hives, skin peeling, or any reaction on the inside of your mouth or nose? No Did you need to seek medical attention at a hospital or doctor's office? No When did it last happen?      15 years ago If all above answers are NO, may proceed with cephalosporin use.     Social History:  Social History   Socioeconomic History   Marital status: Married    Spouse name: Nathanel   Number of children: 3   Years of education: Not on file   Highest education level: Associate degree: occupational, Scientist, product/process development, or vocational program  Occupational History   Occupation: retired    Comment: Curator  Tobacco Use   Smoking status: Never   Smokeless tobacco: Never  Vaping  Use   Vaping status: Never Used  Substance and Sexual Activity   Alcohol use: No    Alcohol/week: 0.0 standard drinks of alcohol   Drug use: No   Sexual activity: Yes  Other Topics Concern   Not on file  Social History Narrative   Married lives with spouse   Has 3 kids biological 1 step child   Right handed   Drinks coffee 2 cups a day, sweet tea with meals, 2-3 soda's a day   Live in one story   Social Drivers of  Health   Financial Resource Strain: Low Risk  (08/27/2024)   Overall Financial Resource Strain (CARDIA)    Difficulty of Paying Living Expenses: Not hard at all  Food Insecurity: No Food Insecurity (08/27/2024)   Hunger Vital Sign    Worried About Running Out of Food in the Last Year: Never true    Ran Out of Food in the Last Year: Never true  Transportation Needs: No Transportation Needs (08/27/2024)   PRAPARE - Administrator, Civil Service (Medical): No    Lack of Transportation (Non-Medical): No  Physical Activity: Insufficiently Active (08/27/2024)   Exercise Vital Sign    Days of Exercise per Week: 1 day    Minutes of Exercise per Session: 10 min  Stress: No Stress Concern Present (08/27/2024)   [employer] of Occupational Health - Occupational Stress Questionnaire    Feeling of Stress: Not at all  Social Connections: Moderately Isolated (08/27/2024)   Social Connection and Isolation Panel    Frequency of Communication with Friends and Family: Three times a week    Frequency of Social Gatherings with Friends and Family: Once a week    Attends Religious Services: Never    Database administrator or Organizations: No    Attends Banker Meetings: Never    Marital Status: Married  Catering manager Violence: Not At Risk (08/27/2024)   Humiliation, Afraid, Rape, and Kick questionnaire    Fear of Current or Ex-Partner: No    Emotionally Abused: No    Physically Abused: No    Sexually Abused: No   Social History   Tobacco Use  Smoking Status Never  Smokeless Tobacco Never   Social History   Substance and Sexual Activity  Alcohol Use No   Alcohol/week: 0.0 standard drinks of alcohol    Family History:  Family History  Problem Relation Age of Onset   Cancer Mother        breast   Heart disease Father 65       CABG   Diabetes Father    CAD Father    Hyperlipidemia Father    Hypertension Father    Cancer Sister        breast   Liver cancer  Sister    Hypothyroidism Sister    Stroke Maternal Grandfather    Diabetes Paternal Grandmother     Past medical history, surgical history, medications, allergies, family history and social history reviewed with patient today and changes made to appropriate areas of the chart.   Review of Systems - negative All other ROS negative except what is listed above and in the HPI.      Objective:    BP 116/77 (BP Location: Left Arm, Patient Position: Sitting, Cuff Size: Large)   Pulse 71   Temp 98.4 F (36.9 C) (Oral)   Resp 17   Ht 5' 10.98 (1.803 m)   Wt 235 lb (106.6 kg)  SpO2 98%   BMI 32.79 kg/m   Wt Readings from Last 3 Encounters:  08/27/24 235 lb (106.6 kg)  03/19/24 234 lb 12.8 oz (106.5 kg)  02/12/24 230 lb 3.2 oz (104.4 kg)    Physical Exam Vitals and nursing note reviewed.  Constitutional:      General: He is awake. He is not in acute distress.    Appearance: He is well-developed and well-groomed. He is obese. He is not ill-appearing or toxic-appearing.  HENT:     Head: Normocephalic and atraumatic.     Right Ear: Hearing, tympanic membrane, ear canal and external ear normal. No drainage.     Left Ear: Hearing, tympanic membrane, ear canal and external ear normal. No drainage.     Nose: Nose normal.     Mouth/Throat:     Pharynx: Uvula midline.  Eyes:     General: Lids are normal.        Right eye: No discharge.        Left eye: No discharge.     Extraocular Movements: Extraocular movements intact.     Conjunctiva/sclera: Conjunctivae normal.     Pupils: Pupils are equal, round, and reactive to light.     Visual Fields: Right eye visual fields normal and left eye visual fields normal.  Neck:     Thyroid : No thyromegaly.     Vascular: No carotid bruit or JVD.     Trachea: Trachea normal.  Cardiovascular:     Rate and Rhythm: Normal rate and regular rhythm.     Heart sounds: Normal heart sounds, S1 normal and S2 normal. No murmur heard.    No gallop.   Pulmonary:     Effort: Pulmonary effort is normal. No accessory muscle usage or respiratory distress.     Breath sounds: Normal breath sounds.  Abdominal:     General: Bowel sounds are normal.     Palpations: Abdomen is soft. There is no hepatomegaly or splenomegaly.     Tenderness: There is no abdominal tenderness.  Musculoskeletal:        General: Normal range of motion.     Cervical back: Normal range of motion and neck supple.     Right lower leg: No edema.     Left lower leg: No edema.  Lymphadenopathy:     Head:     Right side of head: No submental, submandibular, tonsillar, preauricular or posterior auricular adenopathy.     Left side of head: No submental, submandibular, tonsillar, preauricular or posterior auricular adenopathy.     Cervical: No cervical adenopathy.  Skin:    General: Skin is warm and dry.     Capillary Refill: Capillary refill takes less than 2 seconds.     Findings: No rash.  Neurological:     Mental Status: He is alert and oriented to person, place, and time.     Gait: Gait is intact.     Deep Tendon Reflexes: Reflexes are normal and symmetric.     Reflex Scores:      Brachioradialis reflexes are 2+ on the right side and 2+ on the left side.      Patellar reflexes are 2+ on the right side and 2+ on the left side. Psychiatric:        Attention and Perception: Attention normal.        Mood and Affect: Mood normal.        Speech: Speech normal.        Behavior: Behavior normal.  Behavior is cooperative.        Thought Content: Thought content normal.        Cognition and Memory: Cognition normal.        Judgment: Judgment normal.    Diabetic Foot Exam - Simple   Simple Foot Form Visual Inspection See comments: Yes Sensation Testing See comments: Yes Pulse Check Posterior Tibialis and Dorsalis pulse intact bilaterally: Yes Comments 2+ DP and PT both feet.  Sensation left 8/10 and right 7/10. Fungal disease to great toenails.    Results for  orders placed or performed in visit on 02/12/24  Bayer DCA Hb A1c Waived   Collection Time: 02/12/24 10:07 AM  Result Value Ref Range   HB A1C (BAYER DCA - WAIVED) 5.9 (H) 4.8 - 5.6 %  Microalbumin, Urine Waived   Collection Time: 02/12/24 10:07 AM  Result Value Ref Range   Microalb, Ur Waived 30 (H) 0 - 19 mg/L   Creatinine, Urine Waived 100 10 - 300 mg/dL   Microalb/Creat Ratio <30 <30 mg/g  Comprehensive metabolic panel   Collection Time: 02/12/24 10:07 AM  Result Value Ref Range   Glucose 122 (H) 70 - 99 mg/dL   BUN 20 8 - 27 mg/dL   Creatinine, Ser 8.85 0.76 - 1.27 mg/dL   eGFR 70 >40 fO/fpw/8.26   BUN/Creatinine Ratio 18 10 - 24   Sodium 143 134 - 144 mmol/L   Potassium 4.7 3.5 - 5.2 mmol/L   Chloride 101 96 - 106 mmol/L   CO2 26 20 - 29 mmol/L   Calcium  9.4 8.6 - 10.2 mg/dL   Total Protein 6.8 6.0 - 8.5 g/dL   Albumin 4.6 3.9 - 4.9 g/dL   Globulin, Total 2.2 1.5 - 4.5 g/dL   Bilirubin Total 0.6 0.0 - 1.2 mg/dL   Alkaline Phosphatase 97 44 - 121 IU/L   AST 16 0 - 40 IU/L   ALT 12 0 - 44 IU/L  Lipid Panel w/o Chol/HDL Ratio   Collection Time: 02/12/24 10:07 AM  Result Value Ref Range   Cholesterol, Total 126 100 - 199 mg/dL   Triglycerides 96 0 - 149 mg/dL   HDL 38 (L) >60 mg/dL   VLDL Cholesterol Cal 18 5 - 40 mg/dL   LDL Chol Calc (NIH) 70 0 - 99 mg/dL  Vitamin B12   Collection Time: 02/12/24 10:07 AM  Result Value Ref Range   Vitamin B-12 916 232 - 1,245 pg/mL      Assessment & Plan:   Problem List Items Addressed This Visit       Cardiovascular and Mediastinum   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HCC)   Chronic, ongoing with A1c 5.9% today, remaining very stable. Will continue Trulicity  3 MG weekly via assistance program.  Urine ALB 30 February 2025, continue ACE daily.  Check BS 2-3 times a day and document + continue heavy focus on diet.  May benefit addition of SGLT2 for cardiac health in future to maintain A1c goal if elevations - with  goal to maintain off insulin . CCM involved with patient.  Return in 6 months. - Foot and eye exam up to date - ACE and statin on board - Vaccinations up to date.      Relevant Medications   lisinopril  (ZESTRIL ) 2.5 MG tablet   metoprolol  succinate (TOPROL -XL) 100 MG 24 hr tablet   omega-3 acid ethyl esters (LOVAZA ) 1 g capsule   Other Relevant Orders   Bayer DCA Hb A1c Waived  TSH     Digestive   GERD (gastroesophageal reflux disease)   Chronic, stable.  Continue current medication regimen and adjust as needed.  Mag level today.      Relevant Medications   lansoprazole  (PREVACID ) 30 MG capsule   Other Relevant Orders   Magnesium     Endocrine   Type 2 diabetes mellitus with obesity (HCC) - Primary   Chronic, ongoing with A1c 5.9% today, remaining very stable. Will continue Trulicity  3 MG weekly via assistance program.  Urine ALB 30 February 2025, continue ACE daily.  Check BS 2-3 times a day and document + continue heavy focus on diet.  May benefit addition of SGLT2 for cardiac health in future to maintain A1c goal if elevations - with goal to maintain off insulin . CCM involved with patient.  Return in 6 months. - Foot and eye exam up to date - ACE and statin on board - Vaccinations up to date.      Relevant Medications   lisinopril  (ZESTRIL ) 2.5 MG tablet   Other Relevant Orders   Bayer DCA Hb A1c Waived   Hyperlipidemia associated with type 2 diabetes mellitus (HCC)   Chronic, ongoing.  Continue current medication regimen and adjust as needed.  Wear compression hose at home during day.  Lipid panel today, recent LDL < 70.      Relevant Medications   lisinopril  (ZESTRIL ) 2.5 MG tablet   metoprolol  succinate (TOPROL -XL) 100 MG 24 hr tablet   omega-3 acid ethyl esters (LOVAZA ) 1 g capsule   Other Relevant Orders   Bayer DCA Hb A1c Waived   Comprehensive metabolic panel with GFR   Lipid Panel w/o Chol/HDL Ratio   Diabetes mellitus treated with injections of non-insulin   medication (HCC)   Refer to diabetes with obesity plan of care.      Relevant Medications   lisinopril  (ZESTRIL ) 2.5 MG tablet   Other Relevant Orders   Bayer DCA Hb A1c Waived     Nervous and Auditory   Parkinson's disease without dyskinesia or fluctuating manifestations (HCC)   Chronic.  Followed by neurology, continue collaboration and current medication regimen as prescribed by them.  Recent note reviewed and labs.      Relevant Medications   gabapentin  (NEURONTIN ) 100 MG capsule     Genitourinary   Benign prostatic hyperplasia   No current medications, PSA check today.      Relevant Orders   PSA     Other   Vitamin B12 deficiency   Stable.  Noted low normal on October 2021 labs -- continue supplement and recheck today.  Was on Metformin  for long period.      Relevant Orders   CBC with Differential/Platelet   Vitamin B12   Obesity   BMI 32.79 with T2DM, HTN/HLD -- praised for success at maintaining weight loss.  Recommended eating smaller high protein, low fat meals more frequently and exercising 30 mins a day 5 times a week with a goal of 10-15lb weight loss in the next 3 months. Patient voiced their understanding and motivation to adhere to these recommendations.       Chronic anxiety   Chronic, stable on Lexapro .  Continue low dose medication and monitor.  Denies SI/HI.      Relevant Medications   escitalopram  (LEXAPRO ) 10 MG tablet   Other Visit Diagnoses       Flu vaccine need       Flu vaccine today, educated on this.   Relevant Orders  Flu Vaccine Trivalent High Dose (Fluad)     Encounter for annual physical exam       Annual physical today with labs and health maintenance reviewed, discussed with patient.        Discussed aspirin prophylaxis for myocardial infarction prevention and decision was made to continue ASA  LABORATORY TESTING:  Health maintenance labs ordered today as discussed above.   The natural history of prostate cancer and  ongoing controversy regarding screening and potential treatment outcomes of prostate cancer has been discussed with the patient. The meaning of a false positive PSA and a false negative PSA has been discussed. He indicates understanding of the limitations of this screening test and wishes to proceed with screening PSA testing.   IMMUNIZATIONS:   - Tdap: Tetanus vaccination status reviewed: Up To Date - Influenza: Up to date - Pneumovax: Up to date  - Prevnar: Up To Date - Zostavax vaccine: Up to date  SCREENING: - Colonoscopy: Up to date due next 09/25/2029 Discussed with patient purpose of the colonoscopy is to detect colon cancer at curable precancerous or early stages   - AAA Screening: Not applicable  -Hearing Test: Not applicable  -Spirometry: Not applicable   PATIENT COUNSELING:    Sexuality: Discussed sexually transmitted diseases, partner selection, use of condoms, avoidance of unintended pregnancy  and contraceptive alternatives.   Advised to avoid cigarette smoking.  I discussed with the patient that most people either abstain from alcohol or drink within safe limits (<=14/week and <=4 drinks/occasion for males, <=7/weeks and <= 3 drinks/occasion for females) and that the risk for alcohol disorders and other health effects rises proportionally with the number of drinks per week and how often a drinker exceeds daily limits.  Discussed cessation/primary prevention of drug use and availability of treatment for abuse.   Diet: Encouraged to adjust caloric intake to maintain  or achieve ideal body weight, to reduce intake of dietary saturated fat and total fat, to limit sodium intake by avoiding high sodium foods and not adding table salt, and to maintain adequate dietary potassium and calcium  preferably from fresh fruits, vegetables, and low-fat dairy products.    Stressed the importance of regular exercise  Injury prevention: Discussed safety belts, safety helmets, smoke  detector, smoking near bedding or upholstery.   Dental health: Discussed importance of regular tooth brushing, flossing, and dental visits.   Follow up plan: NEXT PREVENTATIVE PHYSICAL DUE IN 1 YEAR. Return in about 6 months (around 02/24/2025) for T2DM, HTN/HLD, PARKINSON'S, ANXIETY.

## 2024-08-27 NOTE — Assessment & Plan Note (Signed)
 BMI 32.79 with T2DM, HTN/HLD -- praised for success at maintaining weight loss.  Recommended eating smaller high protein, low fat meals more frequently and exercising 30 mins a day 5 times a week with a goal of 10-15lb weight loss in the next 3 months. Patient voiced their understanding and motivation to adhere to these recommendations.

## 2024-08-27 NOTE — Assessment & Plan Note (Signed)
No current medications, PSA check today.

## 2024-08-27 NOTE — Assessment & Plan Note (Signed)
 Stable.  Noted low normal on October 2021 labs -- continue supplement and recheck today.  Was on Metformin  for long period.

## 2024-08-27 NOTE — Assessment & Plan Note (Signed)
 Chronic, ongoing.  Continue current medication regimen and adjust as needed.  Wear compression hose at home during day.  Lipid panel today, recent LDL < 70.

## 2024-08-27 NOTE — Assessment & Plan Note (Signed)
Chronic, stable on Lexapro.  Continue low dose medication and monitor.  Denies SI/HI.

## 2024-08-28 ENCOUNTER — Ambulatory Visit: Payer: Self-pay | Admitting: Nurse Practitioner

## 2024-08-28 LAB — COMPREHENSIVE METABOLIC PANEL WITH GFR
ALT: 9 IU/L (ref 0–44)
AST: 22 IU/L (ref 0–40)
Albumin: 4.5 g/dL (ref 3.9–4.9)
Alkaline Phosphatase: 98 IU/L (ref 44–121)
BUN/Creatinine Ratio: 17 (ref 10–24)
BUN: 19 mg/dL (ref 8–27)
Bilirubin Total: 0.8 mg/dL (ref 0.0–1.2)
CO2: 24 mmol/L (ref 20–29)
Calcium: 9.5 mg/dL (ref 8.6–10.2)
Chloride: 102 mmol/L (ref 96–106)
Creatinine, Ser: 1.09 mg/dL (ref 0.76–1.27)
Globulin, Total: 2.2 g/dL (ref 1.5–4.5)
Glucose: 113 mg/dL — ABNORMAL HIGH (ref 70–99)
Potassium: 4.6 mmol/L (ref 3.5–5.2)
Sodium: 139 mmol/L (ref 134–144)
Total Protein: 6.7 g/dL (ref 6.0–8.5)
eGFR: 74 mL/min/1.73 (ref >59)

## 2024-08-28 LAB — CBC WITH DIFFERENTIAL/PLATELET
Basophils Absolute: 0 x10E3/uL (ref 0.0–0.2)
Basos: 1 %
EOS (ABSOLUTE): 0.3 x10E3/uL (ref 0.0–0.4)
Eos: 4 %
Hematocrit: 46.2 % (ref 37.5–51.0)
Hemoglobin: 15.2 g/dL (ref 13.0–17.7)
Immature Grans (Abs): 0 x10E3/uL (ref 0.0–0.1)
Immature Granulocytes: 0 %
Lymphocytes Absolute: 2.5 x10E3/uL (ref 0.7–3.1)
Lymphs: 35 %
MCH: 30.4 pg (ref 26.6–33.0)
MCHC: 32.9 g/dL (ref 31.5–35.7)
MCV: 92 fL (ref 79–97)
Monocytes Absolute: 0.6 x10E3/uL (ref 0.1–0.9)
Monocytes: 8 %
Neutrophils Absolute: 3.7 x10E3/uL (ref 1.4–7.0)
Neutrophils: 52 %
Platelets: 134 x10E3/uL — ABNORMAL LOW (ref 150–450)
RBC: 5 x10E6/uL (ref 4.14–5.80)
RDW: 13.3 % (ref 11.6–15.4)
WBC: 7.2 x10E3/uL (ref 3.4–10.8)

## 2024-08-28 LAB — VITAMIN B12: Vitamin B-12: 517 pg/mL (ref 232–1245)

## 2024-08-28 LAB — LIPID PANEL W/O CHOL/HDL RATIO
Cholesterol, Total: 101 mg/dL (ref 100–199)
HDL: 31 mg/dL — ABNORMAL LOW (ref >39)
LDL Chol Calc (NIH): 46 mg/dL (ref 0–99)
Triglycerides: 136 mg/dL (ref 0–149)
VLDL Cholesterol Cal: 24 mg/dL (ref 5–40)

## 2024-08-28 LAB — TSH: TSH: 2 u[IU]/mL (ref 0.450–4.500)

## 2024-08-28 LAB — MAGNESIUM: Magnesium: 2 mg/dL (ref 1.6–2.3)

## 2024-08-28 LAB — PSA: Prostate Specific Ag, Serum: 0.3 ng/mL (ref 0.0–4.0)

## 2024-08-28 NOTE — Progress Notes (Signed)
 Contacted via MyChart  Good afternoon Brian Frederick, your labs have returned: - Lipid panel continues to show levels at goal. - CBC overall stable with ongoing mildly low platelets we will continue to monitor. - Kidney function, creatinine and eGFR, remains normal, as is liver function, AST and ALT.  - Remainder of labs are all stable.  Any questions? Keep being amazing!!  Thank you for allowing me to participate in your care.  I appreciate you. Kindest regards, Zeshan Sena

## 2024-09-15 ENCOUNTER — Other Ambulatory Visit: Payer: Self-pay | Admitting: Neurology

## 2024-09-15 DIAGNOSIS — R519 Headache, unspecified: Secondary | ICD-10-CM

## 2024-09-15 DIAGNOSIS — G20A1 Parkinson's disease without dyskinesia, without mention of fluctuations: Secondary | ICD-10-CM

## 2024-09-23 ENCOUNTER — Other Ambulatory Visit: Payer: Self-pay | Admitting: Nurse Practitioner

## 2024-09-25 NOTE — Telephone Encounter (Signed)
 Too soon for refill.  Requested Prescriptions  Pending Prescriptions Disp Refills   lisinopril  (ZESTRIL ) 2.5 MG tablet [Pharmacy Med Name: LISINOPRIL  2.5MG  TABLETS] 90 tablet 4    Sig: TAKE 1 TABLET(2.5 MG) BY MOUTH DAILY     Cardiovascular:  ACE Inhibitors Passed - 09/25/2024 11:31 AM      Passed - Cr in normal range and within 180 days    Creatinine  Date Value Ref Range Status  09/02/2013 1.52 (H) 0.60 - 1.30 mg/dL Final   Creatinine, Ser  Date Value Ref Range Status  08/27/2024 1.09 0.76 - 1.27 mg/dL Final         Passed - K in normal range and within 180 days    Potassium  Date Value Ref Range Status  08/27/2024 4.6 3.5 - 5.2 mmol/L Final  09/02/2013 4.2 3.5 - 5.1 mmol/L Final         Passed - Patient is not pregnant      Passed - Last BP in normal range    BP Readings from Last 1 Encounters:  08/27/24 116/77         Passed - Valid encounter within last 6 months    Recent Outpatient Visits           4 weeks ago Type 2 diabetes mellitus with obesity   [facility] [place], [place] T, NP   7 months ago Type 2 diabetes mellitus with obesity   [place] [facility] Valer[place] Melanie DASEN, NP       Future Appointments             In 4 months Hester Alm BROCKS, MD [facility]

## 2024-10-01 ENCOUNTER — Other Ambulatory Visit: Payer: Self-pay | Admitting: Nurse Practitioner

## 2024-10-02 NOTE — Telephone Encounter (Signed)
 Requested Prescriptions  Pending Prescriptions Disp Refills   atorvastatin  (LIPITOR) 20 MG tablet [Pharmacy Med Name: ATORVASTATIN  20MG  TABLETS] 90 tablet 3    Sig: TAKE 1 TABLET(20 MG) BY MOUTH DAILY     Cardiovascular:  Antilipid - Statins Failed - 10/02/2024  3:03 PM      Failed - Lipid Panel in normal range within the last 12 months    Cholesterol, Total  Date Value Ref Range Status  08/27/2024 101 100 - 199 mg/dL Final   Cholesterol Piccolo, Waived  Date Value Ref Range Status  02/15/2020 120 <200 mg/dL Final    Comment:                            Desirable                <200                         Borderline High      200- 239                         High                     >239    LDL Chol Calc (NIH)  Date Value Ref Range Status  08/27/2024 46 0 - 99 mg/dL Final   HDL  Date Value Ref Range Status  08/27/2024 31 (L) >39 mg/dL Final   Triglycerides  Date Value Ref Range Status  08/27/2024 136 0 - 149 mg/dL Final   Triglycerides Piccolo,Waived  Date Value Ref Range Status  02/15/2020 156 (H) <150 mg/dL Final    Comment:                            Normal                   <150                         Borderline High     150 - 199                         High                200 - 499                         Very High                >499          Passed - Patient is not pregnant      Passed - Valid encounter within last 12 months    Recent Outpatient Visits           1 month ago Type 2 diabetes mellitus with obesity   [facility] [place], [place] T, NP   7 months ago Type 2 diabetes mellitus with obesity   [facility] [place], Melanie DASEN, NP       Future Appointments             In 3 months Hester Alm BROCKS, MD [facility]

## 2024-10-02 NOTE — Progress Notes (Unsigned)
   10/02/2024  Patient ID: Brian Frederick, male   DOB: [birth year], 68 y.o.   MRN: [id number]  This patient is appearing on a report for being at risk of failing the adherence measure for hypertension (ACEi/ARB) medications this calendar year.   Medication: Lisinopril  2.5 mg tablets Last fill date: 09/30/24 for 90 day supply  Insurance report was not up to date. No action needed at this time.   Waylon Hershey C. Aubrea [facility]ixner [facility] PharmD Candidate Class of [phone]

## 2024-10-09 NOTE — Progress Notes (Signed)
 Assessment/Plan:   1.  Parkinsons Disease, diagnosed January, 2019.  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  -gave samples for rytary  245 and told him to take 2/2/1 and call me back and let me know if he did better/worse/same as he did with Rytary  195 mg, 2/2/1.  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  -For now he will continue trihexyphenidyl , 2 mg 3 times per day.  He has no side effects with it.  -He follows with dermatology regularly.  Last appt Apr 30, 2024  -not interested in surgical interventions and discussed this as well  -discussed requip but he has had EDS with pramipexole  and doesn't want to take that risk  -Would like to see increased exercise.   2.  Diabetes mellitus  -well controlled with last A1c 5.9   Subjective:   Brian Frederick was seen today in follow up for Parkinsons disease.  My previous records were reviewed prior to todays visit as well as outside records available to me.  Last visit, we increased his Rytary .  He tolerates that well without SE.   He is also on trihexyphenidyl .  He has had no anticholinergic side effects but still has tremor.  He notes it does help tremor some as he recently went fishing and his friend noted he had significant tremor when they first started before the rytary  kicked in and then the rytary  kicked in and the tremor was much decreased.  He also notes tremor is much worse if he forgets the Rytary .  He denies falls.  He denies confusion or hallucinations.  He follows regularly with his dermatologist.  He reports his diabetes is well-controlled and he is down to only using his Trulicity  once per week.   Current prescribed movement disorder medications: Rytary  195 mg, 2/2/1 Trihexyphenidyl , 2 mg 3 times per day   PREVIOUS MEDICATIONS: Pramipexole  caused excessive daytime hypersomnolence (fell  asleep while driving and may have caused compulsive eating); carbidopa /levodopa  IR (nausea); carbidopa /levodopa  CR (still with nausea)  ALLERGIES:   Allergies  Allergen Reactions   Amoxil  [Amoxicillin ] Swelling    Did it involve swelling of the face/tongue/throat, SOB, or low BP? No Did it involve sudden or severe rash/hives, skin peeling, or any reaction on the inside of your mouth or nose? No Did you need to seek medical attention at a hospital or doctor's office? No When did it last happen?      15 years ago If all above answers are NO, may proceed with cephalosporin use.    Penicillin G Benzathine Swelling    Did it involve swelling of the face/tongue/throat, SOB, or low BP? No Did it involve sudden or severe rash/hives, skin peeling, or any reaction on the inside of your mouth or nose? No Did you need to seek medical attention at a hospital or doctor's office? No When did it last happen?      15 years ago If all above answers are NO, may proceed with cephalosporin use.     CURRENT MEDICATIONS:  Outpatient Encounter Medications as of 10/13/2024  Medication Sig   atorvastatin  (LIPITOR) 20 MG tablet TAKE 1 TABLET(20 MG) BY MOUTH DAILY   BD PEN NEEDLE NANO 2ND GEN 32G X 4 MM MISC USE 1 PEN NEEDLE EVERY MORNING   blood glucose meter kit and supplies KIT Dispense based on patient and  insurance preference. Use up to four times daily as directed. (FOR ICD-9 250.00, 250.01).   Carbidopa -Levodopa  ER (RYTARY ) 48.75-195 MG CPCR TAKE 2 CAPSULES BY MOUTH AT 8 AM, 2 CAPSULES AT 1 PM, AND 1 CAPSULE AT 6PM   escitalopram  (LEXAPRO ) 10 MG tablet Take 1 tablet (10 mg total) by mouth daily.   gabapentin  (NEURONTIN ) 100 MG capsule TAKE 2 CAPSULE (200 MG) BY MOUTH TWICE A DAY.   lansoprazole  (PREVACID ) 30 MG capsule TAKE ONE CAPSULE BY MOUTH DAILY AT 12 NOON   lisinopril  (ZESTRIL ) 2.5 MG tablet Take 1 tablet (2.5 mg total) by mouth daily.   meloxicam  (MOBIC ) 15 MG tablet Take 1 tablet (15 mg total)  by mouth daily as needed for pain.   metoprolol  succinate (TOPROL -XL) 100 MG 24 hr tablet TAKE 1 TABLET BY MOUTH EVERY DAY WITH FOOD OR IMMEDIATELY AFTER A MEAL   omega-3 acid ethyl esters (LOVAZA ) 1 g capsule Take 1 capsule (1 g total) by mouth 2 (two) times daily.   ONETOUCH VERIO test strip USE TO TEST BLOOD SUGAR UP TO FOUR TIMES DAILY AS DIRECTED   sildenafil  (VIAGRA ) 100 MG tablet Take 0.5-1 tablets (50-100 mg total) by mouth daily as needed for erectile dysfunction.   tiZANidine (ZANAFLEX) 4 MG tablet Take 1 tablet by mouth at bedtime as needed.   trihexyphenidyl  (ARTANE ) 2 MG tablet TAKE 1 TABLET(2 MG) BY MOUTH THREE TIMES DAILY WITH MEALS.   TRULICITY  3 MG/0.5ML SOAJ Inject 3 mg into the skin once a week.   No facility-administered encounter medications on file as of 10/13/2024.    Objective:   PHYSICAL EXAMINATION:    VITALS:   Vitals:   10/13/24 1047  BP: 126/82  Pulse: 89  SpO2: 98%  Weight: 232 lb (105.2 kg)       Wt Readings from Last 3 Encounters:  10/13/24 232 lb (105.2 kg)  08/27/24 235 lb (106.6 kg)  03/19/24 234 lb 12.8 oz (106.5 kg)     GEN:  The patient appears stated age and is in NAD. HEENT:  Normocephalic, atraumatic.  The mucous membranes are moist. The superficial temporal arteries are without ropiness or tenderness. CV:  RRR Lungs:  CTAB Neck/HEME:  There are no carotid bruits bilaterally.  Neurological examination:  Orientation: The patient is alert and oriented x3. Cranial nerves: There is good facial symmetry without facial hypomimia. The speech is fluent and clear. Soft palate rises symmetrically and there is no tongue deviation. Hearing is intact to conversational tone. Sensation: Sensation is intact to light touch throughout Motor: Strength is at least antigravity x4.   Movement examination: Tone: There is nl tone in the UE/LE Abnormal movements: There is left upper extremity rest tremor, mod Coordination:  There is no significant  decremation, with any form of RAMS, including alternating supination and pronation of the forearm, hand opening and closing, finger taps, heel taps and toe taps with the exception of toe taps on the L Gait and Station: The patient has no difficulty arising out of a deep-seated chair without the use of the hands. The patient's stride length is slightly decreased but he drags the L leg and appears to have a bit of L foot drop with ambulation   I have reviewed and interpreted the following labs independently    Chemistry      Component Value Date/Time   NA 139 08/27/2024 0842   NA 142 09/02/2013 0220   K 4.6 08/27/2024 0842   K 4.2 09/02/2013 0220  CL 102 08/27/2024 0842   CL 107 09/02/2013 0220   CO2 24 08/27/2024 0842   CO2 25 09/02/2013 0220   BUN 19 08/27/2024 0842   BUN 23 (H) 09/02/2013 0220   CREATININE 1.09 08/27/2024 0842   CREATININE 1.52 (H) 09/02/2013 0220      Component Value Date/Time   CALCIUM  9.5 08/27/2024 0842   CALCIUM  9.4 09/02/2013 0220   ALKPHOS 98 08/27/2024 0842   ALKPHOS 107 09/02/2013 0220   AST 22 08/27/2024 0842   AST 30 09/02/2013 0220   ALT 9 08/27/2024 0842   ALT 38 09/02/2013 0220   BILITOT 0.8 08/27/2024 0842   BILITOT 0.3 09/02/2013 0220       Lab Results  Component Value Date   WBC 7.2 08/27/2024   HGB 15.2 08/27/2024   HCT 46.2 08/27/2024   MCV 92 08/27/2024   PLT 134 (L) 08/27/2024    Lab Results  Component Value Date   TSH 2.000 08/27/2024   Lab Results  Component Value Date   HGBA1C 5.9 (H) 08/27/2024    Total time spent on today's visit was 30 minutes, including both face-to-face time and nonface-to-face time.  Time included that spent on review of records (prior notes available to me/labs/imaging if pertinent), discussing treatment and goals, answering patient's questions and coordinating care.   Cc:  Cannady, Jolene T, NP

## 2024-10-11 ENCOUNTER — Other Ambulatory Visit: Payer: Self-pay | Admitting: Nurse Practitioner

## 2024-10-13 ENCOUNTER — Ambulatory Visit: Admitting: Neurology

## 2024-10-13 ENCOUNTER — Encounter: Payer: Self-pay | Admitting: Neurology

## 2024-10-13 VITALS — BP 126/82 | HR 89 | Wt 232.0 lb

## 2024-10-13 DIAGNOSIS — E119 Type 2 diabetes mellitus without complications: Secondary | ICD-10-CM | POA: Diagnosis not present

## 2024-10-13 DIAGNOSIS — E669 Obesity, unspecified: Secondary | ICD-10-CM

## 2024-10-13 DIAGNOSIS — G20A1 Parkinson's disease without dyskinesia, without mention of fluctuations: Secondary | ICD-10-CM

## 2024-10-13 MED ORDER — RYTARY 61.25-245 MG PO CPCR: ORAL_CAPSULE | ORAL | 1 refills | Status: DC

## 2024-10-13 MED ORDER — RYTARY 61.25-245 MG PO CPCR: ORAL_CAPSULE | ORAL | Status: DC

## 2024-10-13 NOTE — Patient Instructions (Signed)
 Trial rytary  245 mg, 2 at 8am, 2 at 11am, 1 at 4pm.  Let me know if this works better, worse, same as the rytary  195 mg.

## 2024-10-13 NOTE — Telephone Encounter (Signed)
 Requested Prescriptions  Refused Prescriptions Disp Refills   gabapentin  (NEURONTIN ) 100 MG capsule [Pharmacy Med Name: GABAPENTIN  100MG  CAPSULES] 360 capsule 4    Sig: TAKE 2 CAPSULES(200 MG) BY MOUTH TWICE DAILY     Neurology: Anticonvulsants - gabapentin  Passed - 10/13/2024 11:26 AM      Passed - Cr in normal range and within 360 days    Creatinine  Date Value Ref Range Status  09/02/2013 1.52 (H) 0.60 - 1.30 mg/dL Final   Creatinine, Ser  Date Value Ref Range Status  08/27/2024 1.09 0.76 - 1.27 mg/dL Final         Passed - Completed PHQ-2 or PHQ-9 in the last 360 days      Passed - Valid encounter within last 12 months    Recent Outpatient Visits           1 month ago Type 2 diabetes mellitus with obesity   [facility] [place], [place] T, NP   8 months ago Type 2 diabetes mellitus with obesity   [facility] [place], Melanie DASEN, NP       Future Appointments             In 3 months Hester Alm BROCKS, MD [facility]

## 2024-11-02 ENCOUNTER — Telehealth: Payer: Self-pay

## 2024-11-02 ENCOUNTER — Other Ambulatory Visit: Payer: Self-pay | Admitting: Nurse Practitioner

## 2024-11-02 NOTE — Telephone Encounter (Signed)
 Gave pt a call pt is coming up due for re-enrollment on [employer] Trulicity , left a HIPAA VM.

## 2024-11-03 NOTE — Telephone Encounter (Signed)
 Requested Prescriptions  Refused Prescriptions Disp Refills   escitalopram  (LEXAPRO ) 10 MG tablet [Pharmacy Med Name: ESCITALOPRAM  10MG  TABLETS] 90 tablet 4    Sig: TAKE 1 TABLET(10 MG) BY MOUTH DAILY     Psychiatry:  Antidepressants - SSRI Passed - 11/03/2024  5:56 PM      Passed - Valid encounter within last 6 months    Recent Outpatient Visits           2 months ago Type 2 diabetes mellitus with obesity   [facility] [place], [place] T, NP   8 months ago Type 2 diabetes mellitus with obesity   [facility] Val[place] Melanie DASEN, NP       Future Appointments             In 2 months Hester Alm BROCKS, MD [facility]

## 2024-12-18 NOTE — Telephone Encounter (Signed)
 Spoke with pt today,pt wants pap [employer] out and will bring it in to provider office,faxed filled pap to provider office to sign and date.

## 2024-12-23 ENCOUNTER — Telehealth: Payer: Self-pay | Admitting: Neurology

## 2024-12-23 NOTE — Telephone Encounter (Signed)
"  Called patient left voicemail   "

## 2024-12-23 NOTE — Telephone Encounter (Signed)
 [place] lvm regarding refills for Rytary .  PH: [phone]

## 2024-12-24 ENCOUNTER — Other Ambulatory Visit: Payer: Self-pay

## 2024-12-24 ENCOUNTER — Telehealth: Payer: Self-pay | Admitting: Neurology

## 2024-12-24 NOTE — Telephone Encounter (Signed)
 Received provider portion [employer] (Trulicity ) from provider office, waiting on pt portion.

## 2024-12-24 NOTE — Telephone Encounter (Signed)
 Pt calling bk [facility] lvm regarding refills for Rytary .   PH: [phone]

## 2024-12-25 ENCOUNTER — Other Ambulatory Visit: Payer: Self-pay

## 2024-12-25 DIAGNOSIS — G20A1 Parkinson's disease without dyskinesia, without mention of fluctuations: Secondary | ICD-10-CM

## 2024-12-25 MED ORDER — CARBIDOPA-LEVODOPA ER 61.25-245 MG PO CPCR: ORAL_CAPSULE | ORAL | 0 refills | Status: DC

## 2024-12-25 NOTE — Telephone Encounter (Signed)
 Sent to [facility]

## 2024-12-30 ENCOUNTER — Telehealth: Payer: Self-pay | Admitting: Nurse Practitioner

## 2024-12-30 NOTE — Telephone Encounter (Signed)
 Copied from CRM [id number]. Topic: General - Other >> Dec 30, 2024  2:19 PM Delon DASEN wrote: Reason for CRM: Patient has not received paperwork for [facility] care for medication assistance

## 2024-12-31 ENCOUNTER — Encounter: Payer: Self-pay | Admitting: Nurse Practitioner

## 2025-01-01 ENCOUNTER — Telehealth: Payer: Self-pay | Admitting: Neurology

## 2025-01-01 ENCOUNTER — Encounter: Payer: Self-pay | Admitting: Nurse Practitioner

## 2025-01-01 ENCOUNTER — Other Ambulatory Visit: Payer: Self-pay | Admitting: Neurology

## 2025-01-01 DIAGNOSIS — G20A1 Parkinson's disease without dyskinesia, without mention of fluctuations: Secondary | ICD-10-CM

## 2025-01-01 DIAGNOSIS — R519 Headache, unspecified: Secondary | ICD-10-CM

## 2025-01-01 NOTE — Telephone Encounter (Signed)
 Pt called in this afternoon and he stated that he is having issues with prescription: Carbidopa -Levodopa  ER ,  Carbidopa -Levodopa  ER (RYTARY ) 61.25-245 MG CPCR  Taking Taking Differently Not Taking Unknown          Carbidopa -Levodopa  ER (RYTARY ) 61.25-245 MG CPCR        and trihexyphenidyl  (ARTANE ) 2 MG tablet , due to insurance will not cover this. Please call.

## 2025-01-04 ENCOUNTER — Other Ambulatory Visit: Payer: Self-pay

## 2025-01-04 DIAGNOSIS — G20A1 Parkinson's disease without dyskinesia, without mention of fluctuations: Secondary | ICD-10-CM

## 2025-01-04 MED ORDER — CARBIDOPA-LEVODOPA ER 25-100 MG PO TBCR: EXTENDED_RELEASE_TABLET | ORAL | 0 refills | Status: AC

## 2025-01-04 NOTE — Telephone Encounter (Signed)
 Brian Frederick called in stating that his insurance is no longer covering his Rx(Rytary ) and would need something else sent in.   [facility]: [phone]

## 2025-01-04 NOTE — Telephone Encounter (Signed)
 [facility] call ID: [id number]  Pt is out of medication.  PH: [phone]

## 2025-01-04 NOTE — Telephone Encounter (Signed)
 Called aptietn and sent carbidopa  levodopa  CR Take 2 at 8 take 2 at lunch and take 1 in the evening. I removed the rytary  that was listed in chart

## 2025-01-07 NOTE — Telephone Encounter (Signed)
 Gave pt a call to follow up on mail out pap [employer],spoke with pt's wife said they did not received application, was able to double check pt address ,will mail back to pt today.

## 2025-01-19 NOTE — Telephone Encounter (Unsigned)
 Copied from CRM [id number]. Topic: General - Call Back - No Documentation >> Jan 18, 2025 11:52 AM Olam RAMAN wrote: Reason for CRM: pt needing to get [employer] paperwork filled out for tulicity has been waiting over a month and verified address on file [id number]. Pt needs this sent for medication. Cb [phone]

## 2025-01-19 NOTE — Telephone Encounter (Signed)
 Can you guys check on this for the patient please?

## 2025-01-22 ENCOUNTER — Telehealth: Payer: Self-pay | Admitting: Nurse Practitioner

## 2025-01-22 NOTE — Telephone Encounter (Signed)
 Patient dropped off Solicitor for [employer]. Patient states that if anything is missing please call him.Application has been placed in the Pharm folder in the back.

## 2025-01-27 ENCOUNTER — Ambulatory Visit: Admitting: Dermatology

## 2025-02-02 ENCOUNTER — Ambulatory Visit: Payer: Medicare Other

## 2025-02-16 ENCOUNTER — Ambulatory Visit: Admitting: Nurse Practitioner

## 2025-02-24 ENCOUNTER — Ambulatory Visit: Admitting: Nurse Practitioner

## 2025-04-13 ENCOUNTER — Ambulatory Visit: Admitting: Neurology
